# Patient Record
Sex: Male | Born: 1950 | Race: White | Hispanic: No | Smoking: Current some day smoker
Health system: Southern US, Community
[De-identification: ages and names within clinical notes are randomized; demographics above are authoritative.]

## PROBLEM LIST (undated history)

## (undated) DIAGNOSIS — C4490 Unspecified malignant neoplasm of skin, unspecified: Secondary | ICD-10-CM

## (undated) DIAGNOSIS — N529 Male erectile dysfunction, unspecified: Secondary | ICD-10-CM

## (undated) DIAGNOSIS — J439 Emphysema, unspecified: Secondary | ICD-10-CM

## (undated) DIAGNOSIS — R110 Nausea: Secondary | ICD-10-CM

## (undated) DIAGNOSIS — J449 Chronic obstructive pulmonary disease, unspecified: Secondary | ICD-10-CM

## (undated) DIAGNOSIS — N419 Inflammatory disease of prostate, unspecified: Secondary | ICD-10-CM

## (undated) DIAGNOSIS — C801 Malignant (primary) neoplasm, unspecified: Secondary | ICD-10-CM

## (undated) DIAGNOSIS — R0600 Dyspnea, unspecified: Secondary | ICD-10-CM

## (undated) DIAGNOSIS — I10 Essential (primary) hypertension: Secondary | ICD-10-CM

## (undated) DIAGNOSIS — N179 Acute kidney failure, unspecified: Secondary | ICD-10-CM

## (undated) DIAGNOSIS — Z8249 Family history of ischemic heart disease and other diseases of the circulatory system: Secondary | ICD-10-CM

## (undated) DIAGNOSIS — E876 Hypokalemia: Secondary | ICD-10-CM

## (undated) DIAGNOSIS — Z862 Personal history of diseases of the blood and blood-forming organs and certain disorders involving the immune mechanism: Secondary | ICD-10-CM

## (undated) DIAGNOSIS — R079 Chest pain, unspecified: Secondary | ICD-10-CM

## (undated) DIAGNOSIS — E785 Hyperlipidemia, unspecified: Secondary | ICD-10-CM

## (undated) HISTORY — DX: Nausea: R11.0

## (undated) HISTORY — DX: Hypokalemia: E87.6

## (undated) HISTORY — DX: Acute kidney failure, unspecified: N17.9

## (undated) HISTORY — DX: Emphysema, unspecified: J43.9

## (undated) HISTORY — DX: Dyspnea, unspecified: R06.00

## (undated) HISTORY — DX: Unspecified malignant neoplasm of skin, unspecified: C44.90

## (undated) HISTORY — DX: Family history of ischemic heart disease and other diseases of the circulatory system: Z82.49

## (undated) HISTORY — DX: Chest pain, unspecified: R07.9

## (undated) HISTORY — DX: Malignant (primary) neoplasm, unspecified: C80.1

## (undated) HISTORY — DX: Essential (primary) hypertension: I10

## (undated) HISTORY — DX: Chronic obstructive pulmonary disease, unspecified: J44.9

## (undated) HISTORY — DX: Personal history of diseases of the blood and blood-forming organs and certain disorders involving the immune mechanism: Z86.2

## (undated) HISTORY — DX: Male erectile dysfunction, unspecified: N52.9

## (undated) HISTORY — PX: TONSILLECTOMY: SUR1361

## (undated) HISTORY — DX: Hyperlipidemia, unspecified: E78.5

---

## 1999-09-23 ENCOUNTER — Encounter (INDEPENDENT_AMBULATORY_CARE_PROVIDER_SITE_OTHER): Payer: Self-pay | Admitting: Specialist

## 1999-09-23 ENCOUNTER — Ambulatory Visit (HOSPITAL_COMMUNITY): Admission: RE | Admit: 1999-09-23 | Discharge: 1999-09-23 | Payer: Self-pay | Admitting: Gastroenterology

## 2003-05-04 ENCOUNTER — Ambulatory Visit (HOSPITAL_COMMUNITY): Admission: RE | Admit: 2003-05-04 | Discharge: 2003-05-04 | Payer: Self-pay | Admitting: Gastroenterology

## 2005-09-21 ENCOUNTER — Encounter: Admission: RE | Admit: 2005-09-21 | Discharge: 2005-09-21 | Payer: Self-pay | Admitting: Cardiology

## 2008-03-27 HISTORY — PX: COLONOSCOPY: SHX174

## 2009-06-22 ENCOUNTER — Encounter: Payer: Self-pay | Admitting: Family Medicine

## 2009-07-06 ENCOUNTER — Ambulatory Visit (HOSPITAL_COMMUNITY): Admission: RE | Admit: 2009-07-06 | Discharge: 2009-07-06 | Payer: Self-pay | Admitting: Orthopedic Surgery

## 2009-11-18 ENCOUNTER — Encounter (HOSPITAL_COMMUNITY): Admission: RE | Admit: 2009-11-18 | Discharge: 2009-12-22 | Payer: Self-pay | Admitting: Cardiology

## 2010-04-28 NOTE — Letter (Signed)
Summary: [facility]  [facility]   Imported By: Maryln Gottron 06/29/2009 15:54:04  _____________________________________________________________________  External Attachment:    Type:   Image     Comment:   External Document

## 2010-07-05 ENCOUNTER — Encounter: Payer: Self-pay | Admitting: Family Medicine

## 2010-07-05 ENCOUNTER — Ambulatory Visit (INDEPENDENT_AMBULATORY_CARE_PROVIDER_SITE_OTHER): Payer: BLUE CROSS/BLUE SHIELD | Admitting: Family Medicine

## 2010-07-05 DIAGNOSIS — K219 Gastro-esophageal reflux disease without esophagitis: Secondary | ICD-10-CM

## 2010-07-05 DIAGNOSIS — IMO0001 Reserved for inherently not codable concepts without codable children: Secondary | ICD-10-CM

## 2010-07-05 DIAGNOSIS — K4090 Unilateral inguinal hernia, without obstruction or gangrene, not specified as recurrent: Secondary | ICD-10-CM

## 2010-07-05 DIAGNOSIS — E785 Hyperlipidemia, unspecified: Secondary | ICD-10-CM

## 2010-07-05 DIAGNOSIS — F17200 Nicotine dependence, unspecified, uncomplicated: Secondary | ICD-10-CM

## 2010-07-05 DIAGNOSIS — J438 Other emphysema: Secondary | ICD-10-CM

## 2010-07-05 DIAGNOSIS — N529 Male erectile dysfunction, unspecified: Secondary | ICD-10-CM

## 2010-07-05 LAB — HEPATIC FUNCTION PANEL
ALT: 14 U/L (ref 0–53)
Alkaline Phosphatase: 66 U/L (ref 39–117)
Bilirubin, Direct: 0.1 mg/dL (ref 0.0–0.3)
Total Bilirubin: 0.7 mg/dL (ref 0.3–1.2)
Total Protein: 6.6 g/dL (ref 6.0–8.3)

## 2010-07-05 LAB — LIPID PANEL
Cholesterol: 136 mg/dL (ref 0–200)
HDL: 36.2 mg/dL — ABNORMAL LOW (ref >39.00)
Total CHOL/HDL Ratio: 4
VLDL: 17.8 mg/dL (ref 0.0–40.0)

## 2010-07-05 MED ORDER — SILDENAFIL CITRATE 100 MG PO TABS: 100.0000 mg | ORAL_TABLET | ORAL | Status: DC | PRN

## 2010-07-05 NOTE — Progress Notes (Signed)
  Subjective:    Patient ID: Martin Navarro, male    DOB: 1950/11/16, 60 y.o.   MRN: [id number]  HPI Patient is  new to establish care. Past medical history reviewed. History of GERD, ongoing cigarette use, emphysema, hyperlipidemia. Recent nuclear stress test negative for ischemia. No cardiac history. He has no history of diabetes. Father had bypass at 42. No family history of premature cardiac disease.  Medications are reviewed. Compliant with all. He takes Crestor, Prilosec, aspirin, and omega-3 supplement.  Recent issue of some left inguinal pain. Went to urologist and was noted to have small inguinal hernia. No visible bulging. Pain exacerbated by lifting and sometimes writing.  Patient is divorced. Smokes one half pack cigarette per day. No alcohol use. Works full time.   Review of Systems  Constitutional: Negative for fever, chills, activity change, appetite change, fatigue and unexpected weight change.  HENT: Negative for sore throat and trouble swallowing.   Respiratory: Positive for cough. Negative for shortness of breath and wheezing.   Cardiovascular: Negative for chest pain, palpitations and leg swelling.  Gastrointestinal: Negative for vomiting, abdominal pain, diarrhea, constipation and blood in stool.  Genitourinary: Negative for dysuria.  Musculoskeletal: Negative for back pain.  Skin: Negative for rash.  Neurological: Negative for dizziness, weakness and headaches.  Hematological: Negative for adenopathy. Does not bruise/bleed easily.  Psychiatric/Behavioral: Negative for dysphoric mood.       Objective:   Physical Exam  Constitutional: He is oriented to person, place, and time. He appears well-developed and well-nourished. No distress.  HENT:  Head: Normocephalic and atraumatic.  Mouth/Throat: Oropharynx is clear and moist. No oropharyngeal exudate.  Eyes: Pupils are equal, round, and reactive to light.  Neck: Neck supple.  Cardiovascular: Normal rate, regular  rhythm and normal heart sounds.   No murmur heard. Pulmonary/Chest: Effort normal and breath sounds normal. No respiratory distress. He has no wheezes. He has no rales.  Abdominal: Bowel sounds are normal. He exhibits no distension and no mass. There is no tenderness. There is no rebound and no guarding.  Genitourinary:       Testes normal. Small left inguinal hernia. Nontender. No inguinal adenopathy  Musculoskeletal: He exhibits no edema.  Lymphadenopathy:    He has no cervical adenopathy.  Neurological: He is alert and oriented to person, place, and time.  Psychiatric: He has a normal mood and affect.          Assessment & Plan:  #1 small left inguinal hernia with associated pain in this region. 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 Obtain labs with lipid and hepatic panel. Patient requests refill of Viagra which has taken in the past for erectile dysfunction

## 2010-07-06 NOTE — Progress Notes (Signed)
Quick Note:  Pt informed ______ 

## 2010-07-11 ENCOUNTER — Encounter: Payer: Self-pay | Admitting: Family Medicine

## 2010-08-12 NOTE — Op Note (Signed)
[facility]  Patient:    ARN, MCOMBER                         MRN: [id number] Adm. Date:  [id number] Attending:  Louie Bun CC:         Osker Mason, M.D.                           Operative Report  PROCEDURE:  Colonoscopy with polypectomy.  INDICATIONS FOR PROCEDURE:  Heme positive stools.  PROCEDURE:  The patient was placed in the left lateral decubitus position and placed on the pulse monitor with continuous low-flow oxygen delivered via nasal cannula.  He was sedated with 70 mg IV Demerol and 8 mg IV Versed.  The Olympus video colonoscope was inserted into the rectum and advanced to the cecum, confirmed by transillumination of McBurneys point and visualization of the ileocecal valve and appendiceal orifice.  The prep was excellent.  The cecum and ascending colon appeared normal with no masses, polyps, diverticula or other mucosal abnormalities.  In the transverse colon was seen a single, 1-cm sessile polyp which was fulgurated by snare.  The remainder of the transverse, descending, sigmoid and rectum appeared normal with no further polyps, masses, diverticula or other mucosal abnormalities.  The rectum, likewise, appeared normal on retroflexed view.  The anus revealed no obvious internal hemorrhoids.  The colonoscope was then withdrawn and the patient returned to the recovery room in stable condition.  He tolerated the procedure well and there were no immediate complications.  IMPRESSION:  Transverse colon polyps, otherwise normal colonoscopy.  PLAN:  Await histology for determination of need for further surveillance colonoscopies. DD:  09/23/99 TD:  09/24/99 Job: [id number]

## 2010-08-12 NOTE — Op Note (Signed)
Martin Navarro, Martin Navarro                            ACCOUNT NO.:  [id number]   MEDICAL RECORD NO.:  [id number]                   PATIENT TYPE:  AMB   LOCATION:  ENDO                                 FACILITY:  [facility]   PHYSICIAN:  John C. Madilyn Fireman, M.D.                 DATE OF BIRTH:  February 01, 1951   DATE OF PROCEDURE:  05/04/2003  DATE OF DISCHARGE:                                 OPERATIVE REPORT   PROCEDURE:  Colonoscopy.   INDICATIONS FOR PROCEDURE:  History of adenomatous colon polyps on initial  colonoscopy four years ago.   DESCRIPTION OF PROCEDURE:  The patient was placed in the left lateral  decubitus position and placed on the pulse monitor with continuous low-flow  oxygen delivered by nasal cannula.  He was sedated with 75 mcg IV fentanyl  and 8 mg IV Versed.  The Olympus video colonoscope was inserted into the  rectum and advanced to the cecum, confirmed by transillumination of  McBurney's point and visualization of the ileocecal valve and appendiceal  orifice.  Prep was excellent.  The cecum, ascending, transverse, and  descending colon all  appeared normal with no masses, polyps, diverticula,  or other mucosal abnormalities.  Within the sigmoid colon were seen a few  scattered diverticula with no other abnormalities.  The rectum appeared  normal and retroflexed view of the anus revealed no obvious internal  hemorrhoids.  The scope was then withdrawn and the patient returned to the  recovery room in stable condition.  He tolerated the procedure well and  there were no immediate complications.   IMPRESSION:  A few scattered diverticula; otherwise normal study.   PLAN:  Repeat colonoscopy in five years.                                               John C. Madilyn Fireman, M.D.    [facility]  D:  05/04/2003  T:  05/04/2003  Job:  [id number]   cc:   Ernestina Penna, M.D.  [address] [place]  [place] [address]  Fax: [phone]

## 2010-10-25 ENCOUNTER — Ambulatory Visit (INDEPENDENT_AMBULATORY_CARE_PROVIDER_SITE_OTHER): Payer: BLUE CROSS/BLUE SHIELD | Admitting: Family Medicine

## 2010-10-25 ENCOUNTER — Encounter: Payer: Self-pay | Admitting: Family Medicine

## 2010-10-25 VITALS — BP 120/82 | HR 72 | Temp 98.1°F | Resp 12 | Ht 65.0 in | Wt 181.0 lb

## 2010-10-25 DIAGNOSIS — K219 Gastro-esophageal reflux disease without esophagitis: Secondary | ICD-10-CM

## 2010-10-25 DIAGNOSIS — E785 Hyperlipidemia, unspecified: Secondary | ICD-10-CM

## 2010-10-25 DIAGNOSIS — N529 Male erectile dysfunction, unspecified: Secondary | ICD-10-CM

## 2010-10-25 DIAGNOSIS — Z720 Tobacco use: Secondary | ICD-10-CM

## 2010-10-25 DIAGNOSIS — Z23 Encounter for immunization: Secondary | ICD-10-CM

## 2010-10-25 DIAGNOSIS — Z0000 Encounter for general adult medical examination without abnormal findings: Secondary | ICD-10-CM

## 2010-10-25 HISTORY — DX: Tobacco use: Z72.0

## 2010-10-25 HISTORY — DX: Gastro-esophageal reflux disease without esophagitis: K21.9

## 2010-10-25 LAB — BASIC METABOLIC PANEL
BUN: 14 mg/dL (ref 6–23)
CO2: 27 mEq/L (ref 19–32)
Calcium: 8.9 mg/dL (ref 8.4–10.5)
Chloride: 108 mEq/L (ref 96–112)
GFR: 102.84 mL/min (ref >60.00)
Potassium: 4.7 mEq/L (ref 3.5–5.1)

## 2010-10-25 LAB — CBC WITH DIFFERENTIAL/PLATELET
Basophils Absolute: 0.1 10*3/uL (ref 0.0–0.1)
MCHC: 32.6 g/dL (ref 30.0–36.0)
MCV: 94.7 fl (ref 78.0–100.0)
Neutro Abs: 11.1 10*3/uL — ABNORMAL HIGH (ref 1.4–7.7)

## 2010-10-25 MED ORDER — SILDENAFIL CITRATE 100 MG PO TABS: 100.0000 mg | ORAL_TABLET | ORAL | Status: DC | PRN

## 2010-10-25 MED ORDER — TETANUS-DIPHTH-ACELL PERTUSSIS 5-2.5-18.5 LF-MCG/0.5 IM SUSP: 0.5000 mL | Freq: Once | INTRAMUSCULAR | Status: DC

## 2010-10-25 MED ORDER — ROSUVASTATIN CALCIUM 10 MG PO TABS: 10.0000 mg | ORAL_TABLET | Freq: Every day | ORAL | Status: DC

## 2010-10-25 MED ORDER — OMEPRAZOLE 40 MG PO CPDR: 40.0000 mg | DELAYED_RELEASE_CAPSULE | Freq: Every day | ORAL | Status: DC

## 2010-10-25 NOTE — Progress Notes (Signed)
Subjective:    Patient ID: Martin Navarro, male    DOB: [birth year], 60 y.o.   MRN: [id number]  HPI Patient seen for complete physical examination. Past medical history, family history, and social history reviewed. He has history of GERD, hyperlipidemia, erectile dysfunction, and low testosterone. Followed by urologist who is monitoring his PSA. Recently several months ago placed on testosterone replacement but he still has problems with chronic low libido. Erectile dysfunction and he has not had great response to medications. Still takes Viagra as needed.  Still smoking.  Recent lipids were checked and at goal with exception of low HDL. Takes Crestor 10 mg daily. No myalgias. No hx of CAD.  History of colonoscopy 2010. Last tetanus unknown. No history of shingles vaccine. Long history of nicotine use.  Past Medical History  Diagnosis Date  . Asthma   . Emphysema of lung   . Hyperlipidemia    No past surgical history on file.  reports that he has been smoking Cigarettes.  He has a 67.5 pack-year smoking history. He does not have any smokeless tobacco history on file. His alcohol and drug histories not on file. family history includes Heart disease (age of onset:65) in his father and Hypertension in his father. No Known Allergies    Review of Systems  Constitutional: Negative for fever, activity change, appetite change, fatigue and unexpected weight change.  HENT: Negative for ear pain, congestion and trouble swallowing.   Eyes: Negative for pain and visual disturbance.  Respiratory: Negative for cough, shortness of breath and wheezing.   Cardiovascular: Negative for chest pain and palpitations.  Gastrointestinal: Negative for nausea, vomiting, abdominal pain, diarrhea, constipation, blood in stool, abdominal distention and rectal pain.  Genitourinary: Negative for dysuria, hematuria and testicular pain.  Musculoskeletal: Negative for joint swelling and arthralgias.  Skin: Negative  for rash.  Neurological: Negative for dizziness, syncope and headaches.  Hematological: Negative for adenopathy.  Psychiatric/Behavioral: Negative for confusion, dysphoric mood and agitation.       Objective:   Physical Exam  Constitutional: He is oriented to person, place, and time. He appears well-developed and well-nourished. No distress.  HENT:  Head: Normocephalic and atraumatic.  Right Ear: External ear normal.  Left Ear: External ear normal.  Mouth/Throat: Oropharynx is clear and moist.  Eyes: Conjunctivae and EOM are normal. Pupils are equal, round, and reactive to light.  Neck: Normal range of motion. Neck supple. No thyromegaly present.  Cardiovascular: Normal rate, regular rhythm and normal heart sounds.   No murmur heard. Pulmonary/Chest: No respiratory distress. He has no wheezes. He has no rales.  Abdominal: Soft. Bowel sounds are normal. He exhibits no distension and no mass. There is no tenderness. There is no rebound and no guarding.  Genitourinary:       Per urology  Musculoskeletal: He exhibits no edema.  Lymphadenopathy:    He has no cervical adenopathy.  Neurological: He is alert and oriented to person, place, and time. He displays normal reflexes. No cranial nerve deficit.  Skin: No rash noted.  Psychiatric: He has a normal mood and affect. His behavior is normal.          Assessment & Plan:  #1 health maintenance. Colonoscopy up-to-date. Tetanus booster given. Yearly flu vaccine recommended. Patient thinks he's had previous Pneumovax. Discussed nicotine cessation but motivation low. Check on coverage for shingles vaccine.  #2 history of GERD controlled with omeprazole 40 mg. Refills given for one year #3 hyperlipidemia. Recent lipids at goal. Refill medication  for one year #4 erectile dysfunction. Refill Viagra. He is to follow up with urologist if not seeing good response with this No problem-specific assessment & plan notes found for this  encounter.

## 2010-10-25 NOTE — Patient Instructions (Signed)
Check on coverage for shingles vaccine and we will be happy to schedule.

## 2010-10-26 ENCOUNTER — Other Ambulatory Visit: Payer: Self-pay | Admitting: Family Medicine

## 2010-10-26 DIAGNOSIS — E785 Hyperlipidemia, unspecified: Secondary | ICD-10-CM

## 2010-10-26 NOTE — Progress Notes (Signed)
Quick Note:  Will mail labs and requested repeat CBC to pt home highlighted ______

## 2010-10-26 NOTE — Progress Notes (Signed)
Quick Note:  Will also order future CBC ______

## 2010-11-01 ENCOUNTER — Telehealth: Payer: Self-pay | Admitting: Family Medicine

## 2010-11-01 DIAGNOSIS — E785 Hyperlipidemia, unspecified: Secondary | ICD-10-CM

## 2010-11-01 NOTE — Telephone Encounter (Signed)
Patient received a letter for Martin Navarro in the mail about his labs. She would like to speak with Martin Navarro regarding this matter. Refused to speak with triage nurse. Thanks.

## 2010-11-01 NOTE — Telephone Encounter (Signed)
Daughter Karoline Caldwell reports his fathers WBC has been high in the past, questioing if we had received medical records from [place].  Also, daughter requesting a lipid panel be done with the repeat CBC lab appt.  Pt did check with his insurance co and they will pay for it, reqiuesting shingles vaccine when he comes for labs OK needed for lipid panel request, pt will need to fast

## 2010-11-01 NOTE — Telephone Encounter (Signed)
OK to add lipid panel to CBC.

## 2010-11-02 NOTE — Telephone Encounter (Signed)
Lipid panel added for future order

## 2010-11-11 ENCOUNTER — Other Ambulatory Visit: Payer: BLUE CROSS/BLUE SHIELD

## 2010-11-25 ENCOUNTER — Other Ambulatory Visit: Payer: BLUE CROSS/BLUE SHIELD

## 2010-11-30 ENCOUNTER — Other Ambulatory Visit (INDEPENDENT_AMBULATORY_CARE_PROVIDER_SITE_OTHER): Payer: BLUE CROSS/BLUE SHIELD

## 2010-11-30 ENCOUNTER — Other Ambulatory Visit (INDEPENDENT_AMBULATORY_CARE_PROVIDER_SITE_OTHER): Payer: BLUE CROSS/BLUE SHIELD | Admitting: Family Medicine

## 2010-11-30 DIAGNOSIS — E785 Hyperlipidemia, unspecified: Secondary | ICD-10-CM

## 2010-11-30 DIAGNOSIS — Z23 Encounter for immunization: Secondary | ICD-10-CM

## 2010-11-30 LAB — LIPID PANEL
HDL: 36 mg/dL — ABNORMAL LOW (ref >39.00)
LDL Cholesterol: 77 mg/dL (ref 0–99)
Total CHOL/HDL Ratio: 4
VLDL: 19.4 mg/dL (ref 0.0–40.0)

## 2010-12-01 LAB — CBC
HCT: 45.5 % (ref 39.0–52.0)
Hemoglobin: 14.9 g/dL (ref 13.0–17.0)
MCV: 93.6 fL (ref 78.0–100.0)
Platelets: 225 10*3/uL (ref 150–400)
RBC: 4.86 MIL/uL (ref 4.22–5.81)
WBC: 10.4 10*3/uL (ref 4.0–10.5)

## 2010-12-02 NOTE — Progress Notes (Signed)
Quick Note:  Spoke with pt - informed ______ 

## 2010-12-02 NOTE — Progress Notes (Signed)
Quick Note:  Spoke with pt - informed of lab results ______ 

## 2011-06-28 ENCOUNTER — Ambulatory Visit (INDEPENDENT_AMBULATORY_CARE_PROVIDER_SITE_OTHER): Payer: BLUE CROSS/BLUE SHIELD | Admitting: Family Medicine

## 2011-06-28 ENCOUNTER — Encounter: Payer: Self-pay | Admitting: Family Medicine

## 2011-06-28 VITALS — BP 122/82 | Temp 98.4°F | Wt 195.0 lb

## 2011-06-28 DIAGNOSIS — R232 Flushing: Secondary | ICD-10-CM

## 2011-06-28 MED ORDER — SILDENAFIL CITRATE 100 MG PO TABS: 50.0000 mg | ORAL_TABLET | Freq: Every day | ORAL | Status: DC | PRN

## 2011-06-28 NOTE — Patient Instructions (Signed)
Hold Cialis for one week to see if flushing resolves and be in touch if no improvement.

## 2011-06-28 NOTE — Progress Notes (Signed)
  Subjective:    Patient ID: Martin Navarro, male    DOB: [birth year], 61 y.o.   MRN: [id number]  HPI  Patient seen with chief complaint of facial flushing. Had seen urologist and placed on Cialis 5 mg daily. He thinks symptoms correlate with that. No history of rosacea. Denies any papules or pustules. No history of niacin use. His other medications include Crestor for hyperlipidemia. Takes Prilosec for GERD symptoms. Those are well controlled. Denies any recent chest pain. No facial rash. No other exacerbating factors such as foods or beverages.  Past Medical History  Diagnosis Date  . Asthma   . Emphysema of lung   . Hyperlipidemia   . Erectile dysfunction    No past surgical history on file.  reports that he has been smoking Cigarettes.  He has a 67.5 pack-year smoking history. He does not have any smokeless tobacco history on file. His alcohol and drug histories not on file. family history includes Heart disease (age of onset:65) in his father and Hypertension in his father. No Known Allergies    Review of Systems  Constitutional: Negative for fever, appetite change, fatigue and unexpected weight change.  Respiratory: Negative for cough and shortness of breath.   Cardiovascular: Negative for chest pain, palpitations and leg swelling.  Gastrointestinal: Negative for abdominal pain.       Objective:   Physical Exam  Constitutional: He appears well-developed and well-nourished.  Neck: Neck supple. No thyromegaly present.  Cardiovascular: Normal rate and regular rhythm.   Pulmonary/Chest: Effort normal and breath sounds normal. No respiratory distress. He has no wheezes. He has no rales.  Musculoskeletal: He exhibits no edema.  Skin: No rash noted.          Assessment & Plan:  Facial flushing. Suspect related to Cialis. He will hold Cialis for the next one to 2 weeks and see if symptoms resolve. If not resolving off that medication be in touch for further evaluation.

## 2011-10-17 ENCOUNTER — Telehealth: Payer: Self-pay | Admitting: Family Medicine

## 2011-10-17 NOTE — Telephone Encounter (Signed)
Caller: [name]/Child; PCP: Evelena Peat; CB#: [phone]; Call regarding Chest Pain/Chest Discomfort;  Onset:10/16/11 X 1-2 hrs. Calling to ask if OK to wait until appt 10/26/11. Male, > age 61, smoker, overweight, hypercholesterol.   Martin Navarro currently at audiology appt. Reviewed s/s MI. Advised to call back for triage when Martin Navarro is present or have Martin Navarro call r/t caller not with pt and unable to provide clinical info to facilitate triage per Info Only Call Guideline. Instructed if chest pain recurs for 5 min or longer, to go immediately to ED.

## 2011-10-17 NOTE — Telephone Encounter (Signed)
Spoke with [name] and she states that he is feeling better and believes that it is the heat.  She does verbally agree that if any changes he should go to the ER.

## 2011-10-17 NOTE — Telephone Encounter (Signed)
Would not wait until August to be evaluated.  As per Call note, should go immediately to ED for any recurrent chest pain.  Otherwise, let's see if we can get him in sooner.

## 2011-10-19 ENCOUNTER — Other Ambulatory Visit (INDEPENDENT_AMBULATORY_CARE_PROVIDER_SITE_OTHER): Payer: BLUE CROSS/BLUE SHIELD

## 2011-10-19 DIAGNOSIS — Z0000 Encounter for general adult medical examination without abnormal findings: Secondary | ICD-10-CM

## 2011-10-19 LAB — LIPID PANEL
Cholesterol: 132 mg/dL (ref 0–200)
HDL: 37.2 mg/dL — ABNORMAL LOW (ref >39.00)
LDL Cholesterol: 77 mg/dL (ref 0–99)
Triglycerides: 91 mg/dL (ref 0.0–149.0)
VLDL: 18.2 mg/dL (ref 0.0–40.0)

## 2011-10-19 LAB — POCT URINALYSIS DIPSTICK
Leukocytes, UA: NEGATIVE
Nitrite, UA: NEGATIVE
Spec Grav, UA: 1.025
Urobilinogen, UA: 0.2

## 2011-10-19 LAB — BASIC METABOLIC PANEL
BUN: 14 mg/dL (ref 6–23)
CO2: 27 mEq/L (ref 19–32)
Calcium: 9.1 mg/dL (ref 8.4–10.5)
Chloride: 106 mEq/L (ref 96–112)
Glucose, Bld: 96 mg/dL (ref 70–99)
Potassium: 3.9 mEq/L (ref 3.5–5.1)

## 2011-10-19 LAB — CBC WITH DIFFERENTIAL/PLATELET
Basophils Absolute: 0.1 10*3/uL (ref 0.0–0.1)
Basophils Relative: 0.5 % (ref 0.0–3.0)
Eosinophils Absolute: 0.3 10*3/uL (ref 0.0–0.7)
Hemoglobin: 15.5 g/dL (ref 13.0–17.0)
Lymphocytes Relative: 27.4 % (ref 12.0–46.0)
MCHC: 33.2 g/dL (ref 30.0–36.0)
Monocytes Absolute: 1 10*3/uL (ref 0.1–1.0)
Neutrophils Relative %: 62.2 % (ref 43.0–77.0)
Platelets: 173 10*3/uL (ref 150.0–400.0)
RDW: 15 % — ABNORMAL HIGH (ref 11.5–14.6)

## 2011-10-19 LAB — HEMOGLOBIN A1C: Hgb A1c MFr Bld: 6.6 % — ABNORMAL HIGH (ref 4.6–6.5)

## 2011-10-19 LAB — PSA: PSA: 0.66 ng/mL (ref 0.10–4.00)

## 2011-10-19 LAB — TSH: TSH: 1.59 u[IU]/mL (ref 0.35–5.50)

## 2011-10-19 LAB — HEPATIC FUNCTION PANEL
AST: 16 U/L (ref 0–37)
Alkaline Phosphatase: 67 U/L (ref 39–117)

## 2011-10-23 ENCOUNTER — Other Ambulatory Visit: Payer: Self-pay | Admitting: Family Medicine

## 2011-10-26 ENCOUNTER — Encounter: Payer: Self-pay | Admitting: Family Medicine

## 2011-10-26 ENCOUNTER — Ambulatory Visit (INDEPENDENT_AMBULATORY_CARE_PROVIDER_SITE_OTHER): Payer: BLUE CROSS/BLUE SHIELD | Admitting: Family Medicine

## 2011-10-26 VITALS — BP 132/80 | HR 72 | Temp 98.2°F | Resp 12 | Ht 65.0 in | Wt 179.0 lb

## 2011-10-26 DIAGNOSIS — E291 Testicular hypofunction: Secondary | ICD-10-CM

## 2011-10-26 DIAGNOSIS — Z0000 Encounter for general adult medical examination without abnormal findings: Secondary | ICD-10-CM

## 2011-10-26 HISTORY — DX: Testicular hypofunction: E29.1

## 2011-10-26 NOTE — Progress Notes (Signed)
  Subjective:    Patient ID: Martin Navarro, male    DOB: [birth year], 61 y.o.   MRN: [id number]  HPI  Patient for complete physical. Ongoing nicotine use. He is trying to quit. He has long-standing history of erectile dysfunction and hypogonadism. Has been followed by urology. After replacement with testosterone he did not see improvement in erectile dysfunction or low libido and has stopped injections at this time. Hyperlipidemia treated Crestor. Immunizations up-to-date. Colonoscopy up to date.  Past Medical History  Diagnosis Date  . Asthma   . Emphysema of lung   . Hyperlipidemia   . Erectile dysfunction    No past surgical history on file.  reports that he has been smoking Cigarettes.  He has a 67.5 pack-year smoking history. He does not have any smokeless tobacco history on file. His alcohol and drug histories not on file. family history includes Heart disease (age of onset:65) in his father and Hypertension in his father. No Known Allergies   Review of Systems  Constitutional: Negative for fever, activity change, appetite change, fatigue and unexpected weight change.  HENT: Negative for ear pain, congestion and trouble swallowing.   Eyes: Negative for pain and visual disturbance.  Respiratory: Negative for cough, shortness of breath and wheezing.   Cardiovascular: Negative for chest pain and palpitations.  Gastrointestinal: Negative for nausea, vomiting, abdominal pain, diarrhea, constipation, blood in stool, abdominal distention and rectal pain.  Genitourinary: Negative for dysuria, hematuria and testicular pain.  Musculoskeletal: Negative for joint swelling and arthralgias.  Skin: Negative for rash.  Neurological: Negative for dizziness, syncope and headaches.  Hematological: Negative for adenopathy.  Psychiatric/Behavioral: Negative for confusion and dysphoric mood.       Objective:   Physical Exam  Constitutional: He is oriented to person, place, and time. He appears  well-developed and well-nourished. No distress.  HENT:  Head: Normocephalic and atraumatic.  Right Ear: External ear normal.  Left Ear: External ear normal.  Mouth/Throat: Oropharynx is clear and moist.  Eyes: Conjunctivae and EOM are normal. Pupils are equal, round, and reactive to light.  Neck: Normal range of motion. Neck supple. No thyromegaly present.  Cardiovascular: Normal rate, regular rhythm and normal heart sounds.   No murmur heard. Pulmonary/Chest: No respiratory distress. He has no wheezes. He has no rales.  Abdominal: Soft. Bowel sounds are normal. He exhibits no distension and no mass. There is no tenderness. There is no rebound and no guarding.  Musculoskeletal: He exhibits no edema.  Lymphadenopathy:    He has no cervical adenopathy.  Neurological: He is alert and oriented to person, place, and time. He displays normal reflexes. No cranial nerve deficit.  Skin: No rash noted.  Psychiatric: He has a normal mood and affect.          Assessment & Plan:  Complete physical. Smoking cessation discussed. Information given. Patient is considering electronic cigarettes. Labs reviewed with patient. All basically favorable with the exception of low HDL.

## 2011-10-26 NOTE — Patient Instructions (Addendum)
Smoking Cessation This document explains the best ways for you to quit smoking and new treatments to help. It lists new medicines that can double or triple your chances of quitting and quitting for good. It also considers ways to avoid relapses and concerns you may have about quitting, including weight gain. NICOTINE: A POWERFUL ADDICTION If you have tried to quit smoking, you know how hard it can be. It is hard because nicotine is a very addictive drug. For some people, it can be as addictive as heroin or cocaine. Usually, people make 2 or 3 tries, or more, before finally being able to quit. Each time you try to quit, you can learn about what helps and what hurts. Quitting takes hard work and a lot of effort, but you can quit smoking. QUITTING SMOKING IS ONE OF THE MOST IMPORTANT THINGS YOU WILL EVER DO.  You will live longer, feel better, and live better.   The impact on your body of quitting smoking is felt almost immediately:   Within 20 minutes, blood pressure decreases. Pulse returns to its normal level.   After 8 hours, carbon monoxide levels in the blood return to normal. Oxygen level increases.   After 24 hours, chance of heart attack starts to decrease. Breath, hair, and body stop smelling like smoke.   After 48 hours, damaged nerve endings begin to recover. Sense of taste and smell improve.   After 72 hours, the body is virtually free of nicotine. Bronchial tubes relax and breathing becomes easier.   After 2 to 12 weeks, lungs can hold more air. Exercise becomes easier and circulation improves.   Quitting will reduce your risk of having a heart attack, stroke, cancer, or lung disease:   After 1 year, the risk of coronary heart disease is cut in half.   After 5 years, the risk of stroke falls to the same as a nonsmoker.   After 10 years, the risk of lung cancer is cut in half and the risk of other cancers decreases significantly.   After 15 years, the risk of coronary heart  disease drops, usually to the level of a nonsmoker.   If you are pregnant, quitting smoking will improve your chances of having a healthy baby.   The people you live with, especially your children, will be healthier.   You will have extra money to spend on things other than cigarettes.  FIVE KEYS TO QUITTING Studies have shown that these 5 steps will help you quit smoking and quit for good. You have the best chances of quitting if you use them together: 1. Get ready.  2. Get support and encouragement.  3. Learn new skills and behaviors.  4. Get medicine to reduce your nicotine addiction and use it correctly.  5. Be prepared for relapse or difficult situations. Be determined to continue trying to quit, even if you do not succeed at first.  1. GET READY  Set a quit date.   Change your environment.   Get rid of ALL cigarettes, ashtrays, matches, and lighters in your home, car, and place of work.   Do not let people smoke in your home.   Review your past attempts to quit. Think about what worked and what did not.   Once you quit, do not smoke. NOT EVEN A PUFF!  2. GET SUPPORT AND ENCOURAGEMENT Studies have shown that you have a better chance of being successful if you have help. You can get support in many ways.  Tell   your family, friends, and coworkers that you are going to quit and need their support. Ask them not to smoke around you.   Talk to your caregivers (doctor, dentist, nurse, pharmacist, psychologist, and/or smoking counselor).   Get individual, group, or telephone counseling and support. The more counseling you have, the better your chances are of quitting. Programs are available at local hospitals and health centers. Call your local health department for information about programs in your area.   Spiritual beliefs and practices may help some smokers quit.   Quit meters are small computer programs online or downloadable that keep track of quit statistics, such as amount  of "quit-time," cigarettes not smoked, and money saved.   Many smokers find one or more of the many self-help books available useful in helping them quit and stay off tobacco.  3. LEARN NEW SKILLS AND BEHAVIORS  Try to distract yourself from urges to smoke. Talk to someone, go for a walk, or occupy your time with a task.   When you first try to quit, change your routine. Take a different route to work. Drink tea instead of coffee. Eat breakfast in a different place.   Do something to reduce your stress. Take a hot bath, exercise, or read a book.   Plan something enjoyable to do every day. Reward yourself for not smoking.   Explore interactive web-based programs that specialize in helping you quit.  4. GET MEDICINE AND USE IT CORRECTLY Medicines can help you stop smoking and decrease the urge to smoke. Combining medicine with the above behavioral methods and support can quadruple your chances of successfully quitting smoking. The [place] Food and Drug Administration (FDA) has approved 7 medicines to help you quit smoking. These medicines fall into 3 categories.  Nicotine replacement therapy (delivers nicotine to your body without the negative effects and risks of smoking):   Nicotine gum: Available over-the-counter.   Nicotine lozenges: Available over-the-counter.   Nicotine inhaler: Available by prescription.   Nicotine nasal spray: Available by prescription.   Nicotine skin patches (transdermal): Available by prescription and over-the-counter.   Antidepressant medicine (helps people abstain from smoking, but how this works is unknown):   Bupropion sustained-release (SR) tablets: Available by prescription.   Nicotinic receptor partial agonist (simulates the effect of nicotine in your brain):   Varenicline tartrate tablets: Available by prescription.   Ask your caregiver for advice about which medicines to use and how to use them. Carefully read the information on the package.    Everyone who is trying to quit may benefit from using a medicine. If you are pregnant or trying to become pregnant, nursing an infant, you are under age 18, or you smoke fewer than 10 cigarettes per day, talk to your caregiver before taking any nicotine replacement medicines.   You should stop using a nicotine replacement product and call your caregiver if you experience nausea, dizziness, weakness, vomiting, fast or irregular heartbeat, mouth problems with the lozenge or gum, or redness or swelling of the skin around the patch that does not go away.   Do not use any other product containing nicotine while using a nicotine replacement product.   Talk to your caregiver before using these products if you have diabetes, heart disease, asthma, stomach ulcers, you had a recent heart attack, you have high blood pressure that is not controlled with medicine, a history of irregular heartbeat, or you have been prescribed medicine to help you quit smoking.  5. BE PREPARED FOR RELAPSE OR   DIFFICULT SITUATIONS  Most relapses occur within the first 3 months after quitting. Do not be discouraged if you start smoking again. Remember, most people try several times before they finally quit.   You may have symptoms of withdrawal because your body is used to nicotine. You may crave cigarettes, be irritable, feel very hungry, cough often, get headaches, or have difficulty concentrating.   The withdrawal symptoms are only temporary. They are strongest when you first quit, but they will go away within 10 to 14 days.  Here are some difficult situations to watch for:  Alcohol. Avoid drinking alcohol. Drinking lowers your chances of successfully quitting.   Caffeine. Try to reduce the amount of caffeine you consume. It also lowers your chances of successfully quitting.   Other smokers. Being around smoking can make you want to smoke. Avoid smokers.   Weight gain. Many smokers will gain weight when they quit, usually  less than 10 pounds. Eat a healthy diet and stay active. Do not let weight gain distract you from your main goal, quitting smoking. Some medicines that help you quit smoking may also help delay weight gain. You can always lose the weight gained after you quit.   Bad mood or depression. There are a lot of ways to improve your mood other than smoking.  If you are having problems with any of these situations, talk to your caregiver. SPECIAL SITUATIONS AND CONDITIONS Studies suggest that everyone can quit smoking. Your situation or condition can give you a special reason to quit.  Pregnant women/new mothers: By quitting, you protect your baby's health and your own.   Hospitalized patients: By quitting, you reduce health problems and help healing.   Heart attack patients: By quitting, you reduce your risk of a second heart attack.   Lung, head, and neck cancer patients: By quitting, you reduce your chance of a second cancer.   Parents of children and adolescents: By quitting, you protect your children from illnesses caused by secondhand smoke.  QUESTIONS TO THINK ABOUT Think about the following questions before you try to stop smoking. You may want to talk about your answers with your caregiver.  Why do you want to quit?   If you tried to quit in the past, what helped and what did not?   What will be the most difficult situations for you after you quit? How will you plan to handle them?   Who can help you through the tough times? Your family? Friends? Caregiver?   What pleasures do you get from smoking? What ways can you still get pleasure if you quit?  Here are some questions to ask your caregiver:  How can you help me to be successful at quitting?   What medicine do you think would be best for me and how should I take it?   What should I do if I need more help?   What is smoking withdrawal like? How can I get information on withdrawal?  Quitting takes hard work and a lot of effort,  but you can quit smoking. FOR MORE INFORMATION  Smokefree.gov (http://www.smokefree.gov) provides free, accurate, evidence-based information and professional assistance to help support the immediate and long-term needs of people trying to quit smoking. Document Released: 03/07/2001 Document Revised: 03/02/2011 Document Reviewed: 12/28/2008 [employer] Patient Information 2012 [employer]. 

## 2011-11-28 ENCOUNTER — Other Ambulatory Visit: Payer: Self-pay | Admitting: Family Medicine

## 2012-01-29 ENCOUNTER — Other Ambulatory Visit: Payer: Self-pay | Admitting: Family Medicine

## 2012-06-07 ENCOUNTER — Emergency Department (HOSPITAL_COMMUNITY): Payer: BC Managed Care – PPO

## 2012-06-07 ENCOUNTER — Emergency Department (HOSPITAL_COMMUNITY)
Admission: EM | Admit: 2012-06-07 | Discharge: 2012-06-07 | Disposition: A | Discharge status: Home / Self Care | Payer: BC Managed Care – PPO | Attending: Emergency Medicine | Admitting: Emergency Medicine

## 2012-06-07 ENCOUNTER — Encounter (HOSPITAL_COMMUNITY): Payer: Self-pay | Admitting: Emergency Medicine

## 2012-06-07 ENCOUNTER — Telehealth (INDEPENDENT_AMBULATORY_CARE_PROVIDER_SITE_OTHER): Payer: Self-pay | Admitting: *Deleted

## 2012-06-07 DIAGNOSIS — N529 Male erectile dysfunction, unspecified: Secondary | ICD-10-CM | POA: Insufficient documentation

## 2012-06-07 DIAGNOSIS — M545 Low back pain, unspecified: Secondary | ICD-10-CM | POA: Insufficient documentation

## 2012-06-07 DIAGNOSIS — Z8709 Personal history of other diseases of the respiratory system: Secondary | ICD-10-CM | POA: Insufficient documentation

## 2012-06-07 DIAGNOSIS — F17200 Nicotine dependence, unspecified, uncomplicated: Secondary | ICD-10-CM | POA: Insufficient documentation

## 2012-06-07 DIAGNOSIS — J45909 Unspecified asthma, uncomplicated: Secondary | ICD-10-CM | POA: Insufficient documentation

## 2012-06-07 DIAGNOSIS — E785 Hyperlipidemia, unspecified: Secondary | ICD-10-CM | POA: Insufficient documentation

## 2012-06-07 DIAGNOSIS — Z79899 Other long term (current) drug therapy: Secondary | ICD-10-CM | POA: Insufficient documentation

## 2012-06-07 DIAGNOSIS — Z7982 Long term (current) use of aspirin: Secondary | ICD-10-CM | POA: Insufficient documentation

## 2012-06-07 DIAGNOSIS — R109 Unspecified abdominal pain: Secondary | ICD-10-CM | POA: Insufficient documentation

## 2012-06-07 LAB — CBC WITH DIFFERENTIAL/PLATELET
Basophils Absolute: 0.1 10*3/uL (ref 0.0–0.1)
Basophils Relative: 0 % (ref 0–1)
Eosinophils Absolute: 0.2 10*3/uL (ref 0.0–0.7)
MCH: 31.1 pg (ref 26.0–34.0)
MCHC: 34 g/dL (ref 30.0–36.0)
Neutro Abs: 6.8 10*3/uL (ref 1.7–7.7)
Neutrophils Relative %: 59 % (ref 43–77)
Platelets: 215 10*3/uL (ref 150–400)
RDW: 12.9 % (ref 11.5–15.5)

## 2012-06-07 LAB — COMPREHENSIVE METABOLIC PANEL
AST: 18 U/L (ref 0–37)
Albumin: 3.6 g/dL (ref 3.5–5.2)
Alkaline Phosphatase: 68 U/L (ref 39–117)
BUN: 15 mg/dL (ref 6–23)
Potassium: 4.2 mEq/L (ref 3.5–5.1)
Sodium: 135 mEq/L (ref 135–145)
Total Protein: 6.8 g/dL (ref 6.0–8.3)

## 2012-06-07 LAB — URINE MICROSCOPIC-ADD ON

## 2012-06-07 LAB — URINALYSIS, ROUTINE W REFLEX MICROSCOPIC
Hgb urine dipstick: NEGATIVE
Protein, ur: NEGATIVE mg/dL
Urobilinogen, UA: 0.2 mg/dL (ref 0.0–1.0)

## 2012-06-07 MED ORDER — NAPROXEN 500 MG PO TABS: 500.0000 mg | ORAL_TABLET | Freq: Two times a day (BID) | ORAL | Status: DC

## 2012-06-07 MED ORDER — KETOROLAC TROMETHAMINE 30 MG/ML IJ SOLN
30.0000 mg | Freq: Once | INTRAMUSCULAR | Status: AC
  Administered 2012-06-07: 30 mg via INTRAVENOUS
  Filled 2012-06-07: qty 1

## 2012-06-07 NOTE — ED Provider Notes (Signed)
History     CSN: [id number]  Arrival date & time 06/07/12  1325   First MD Initiated Contact with Patient 06/07/12 1453      Chief Complaint  Patient presents with  . Abdominal Pain  . Back Pain    (Consider location/radiation/quality/duration/timing/severity/associated sxs/prior treatment) HPI Comments: Pt presents with lower abdominal discomfort and lower back discomfort.  This was present on awakening yesterday and has been intermittent for 2 days.  He denies increased exertion but states it feels like he did too many sit ups.  The pt has no abdominal surgical history, has hx of smoking, pain is intermittent, worse with movement such as standing up straight and with raising legs when laying down.  When he holds still he has no pain.  He has no f/c/n/v/d.  He has not had any meds for this pain either.  Patient is a 62 y.o. male presenting with abdominal pain and back pain. The history is provided by the patient.  Abdominal Pain Back Pain Associated symptoms: abdominal pain     Past Medical History  Diagnosis Date  . Asthma   . Emphysema of lung   . Hyperlipidemia   . Erectile dysfunction     History reviewed. No pertinent past surgical history.  Family History  Problem Relation Age of Onset  . Hypertension Father   . Heart disease Father 17    History  Substance Use Topics  . Smoking status: Current Every Day Smoker -- 1.50 packs/day for 45 years    Types: Cigarettes  . Smokeless tobacco: Not on file  . Alcohol Use: No      Review of Systems  Gastrointestinal: Positive for abdominal pain.  Musculoskeletal: Positive for back pain.  All other systems reviewed and are negative.    Allergies  Review of patient's allergies indicates no known allergies.  Home Medications   Current Outpatient Rx  Name  Route  Sig  Dispense  Refill  . aspirin 81 MG tablet   Oral   Take 81 mg by mouth daily.           . calcium-vitamin D (OSCAL WITH D) 500-200 MG-UNIT  per tablet   Oral   Take 1 tablet by mouth daily.           . CRESTOR 10 MG tablet      TAKE 1 TABLET DAILY   90 tablet   3   . Omega-3 Fatty Acids (FISH OIL) 300 MG CAPS   Oral   Take by mouth daily.           Marland Kitchen PRILOSEC 40 MG capsule      TAKE (1) CAPSULE DAILY   90 each   3   . naproxen (NAPROSYN) 500 MG tablet   Oral   Take 1 tablet (500 mg total) by mouth 2 (two) times daily with a meal.   30 tablet   0   . tadalafil (CIALIS) 5 MG tablet   Oral   Take 5 mg by mouth daily as needed.           BP 112/71  Pulse 81  Temp(Src) 98.2 F (36.8 C)  Resp 20  SpO2 100%  Physical Exam  Nursing note and vitals reviewed. Constitutional: He appears well-developed and well-nourished. No distress.  HENT:  Head: Normocephalic and atraumatic.  Mouth/Throat: Oropharynx is clear and moist. No oropharyngeal exudate.  Eyes: Conjunctivae and EOM are normal. Pupils are equal, round, and reactive to light. Right eye  exhibits no discharge. Left eye exhibits no discharge. No scleral icterus.  Neck: Normal range of motion. Neck supple. No JVD present. No thyromegaly present.  Cardiovascular: Normal rate, regular rhythm, normal heart sounds and intact distal pulses.  Exam reveals no gallop and no friction rub.   No murmur heard. Pulmonary/Chest: Effort normal and breath sounds normal. No respiratory distress. He has no wheezes. He has no rales.  Abdominal: Soft. Bowel sounds are normal. He exhibits no distension and no mass. There is tenderness (no abd ttp but has + carnett's sign.  no pain in RLQ, no guarding, no masses, no pulsating masses.).  No CVA ttp  Genitourinary:  No hernias, normal appearing Penis, Testicles and sCrotum.  Musculoskeletal: Normal range of motion. He exhibits no edema and no tenderness ( no ttp over the lower back).  Lymphadenopathy:    He has no cervical adenopathy.  Neurological: He is alert. Coordination normal.  Skin: Skin is warm and dry. No rash  noted. No erythema.  Psychiatric: He has a normal mood and affect. His behavior is normal.    ED Course  Procedures (including critical care time)  Labs Reviewed  CBC WITH DIFFERENTIAL - Abnormal; Notable for the following:    WBC 11.5 (*)    Monocytes Absolute 1.1 (*)    All other components within normal limits  COMPREHENSIVE METABOLIC PANEL - Abnormal; Notable for the following:    Glucose, Bld 110 (*)    All other components within normal limits  URINALYSIS, ROUTINE W REFLEX MICROSCOPIC - Abnormal; Notable for the following:    APPearance CLOUDY (*)    Leukocytes, UA TRACE (*)    All other components within normal limits  URINE MICROSCOPIC-ADD ON   US Aorta  06/07/2012  *RADIOLOGY REPORT*  Clinical Data:  62 year old male with abdominal and back pain.  ULTRASOUND OF ABDOMINAL AORTA  Technique:  Ultrasound examination of the abdominal aorta was performed to evaluate for abdominal aortic aneurysm.  Comparison: None  Abdominal Aorta:  No aneurysm identified.        Maximum AP diameter:  2.4 cm.       Maximum TRV diameter:  2.1 cm.  Ectasia of the both common iliac arteries noted measuring 1.4 cm in diameter.  IMPRESSION: No abdominal aortic aneurysm identified.  Ectatic common iliac arteries bilaterally.   Original Report Authenticated By: Harmon Pier, M.D.      1. Abdominal pain       MDM  The pt has abd pain which is unexplained - has no hernias, has no masses and has no dysuria, fever or abn VS.  He has pain which is worsened with sit up and with raising legs.  With back pain will obtain US to r/o AAA - had father who just had ruptured AAA.  Otherwise appears stable.  Labs without acute findings.  There is a slight leukocytosis of 11,500, ultrasound of the aorta does not show any signs of aneurysm, no hematuria or infection, patient stable for discharge with followup, suspect abdominal wall muscle pain, no signs of more significant underlying process, the patient has been given  indications for followup and has expressed his understanding. Naprosyn for home     Vida Roller, MD 06/07/12 1723

## 2012-06-07 NOTE — ED Notes (Signed)
Pt c/o 8/10 right lower abdominal pain and lower back pain. Pt was diagnosed with a hernia six months ago. Pt reports similar pain to pain that he experienced when the hernia was diagnosed.

## 2012-06-07 NOTE — Telephone Encounter (Signed)
Daughter called to state that she believes patient's inguinal hernia has popped out. Daughter states patient has been in pain since yesterday with no relief.  Daughter also reports that patient is unable to stand up straight.  Instructed daughter how to have patient "pop" back in hernia.  Encouraged daughter if she is unable to get the hernia back in to take patient to ED due to symptoms.  Daughter had said patient was seen here a couple years ago by Corliss Skains MD but this RN does not see that patient has ever been seen here in the past.

## 2012-06-07 NOTE — Telephone Encounter (Signed)
Daughter called back to state that patient can not feel any "knot" but he is going to the ED just to be evaluated.  Explained to daughter that he needs to check in as an ED patient and if they feel like we need to be involved they will page [place].  Daughter states understanding and agreeable at this time.

## 2012-06-10 ENCOUNTER — Ambulatory Visit (INDEPENDENT_AMBULATORY_CARE_PROVIDER_SITE_OTHER): Payer: BC Managed Care – PPO | Admitting: Family Medicine

## 2012-06-10 ENCOUNTER — Encounter: Payer: Self-pay | Admitting: Family Medicine

## 2012-06-10 VITALS — BP 128/68 | Temp 97.8°F | Wt 194.0 lb

## 2012-06-10 DIAGNOSIS — R109 Unspecified abdominal pain: Secondary | ICD-10-CM

## 2012-06-10 NOTE — Patient Instructions (Addendum)
Follow up for any recurrent abdominal pain.

## 2012-06-10 NOTE — Progress Notes (Signed)
  Subjective:    Patient ID: Martin Navarro, male    DOB: 02/18/1951, 62 y.o.   MRN: [id number]  HPI Followup ER visit. Notes reviewed. Patient presented on Friday with 2 day history of intermittent somewhat bilateral lower abdominal pain. No injury. Patient denies any fever, nausea, vomiting, dysuria, or change in stool habits No recent appetite or weight changes. Patient had some radiation toward the back area. Pain was worse with extension and relieved somewhat with back flexion.  Patient had workup in the emergency department including abdominal ultrasound to rule out aneurysm which was negative Labs were unremarkable. He has chronic mild elevated white count which was stable at 11.5 thousand. Urinalysis unremarkable  Patient prescribed naproxen and actually slightly improved today. No difficulty with ambulation. No radiculopathy symptoms.  Patient has history of questionable small left inguinal hernia. He did not notice any recent bulge.  Past Medical History  Diagnosis Date  . Asthma   . Emphysema of lung   . Hyperlipidemia   . Erectile dysfunction    No past surgical history on file.  reports that he has been smoking Cigarettes.  He has a 67.5 pack-year smoking history. He does not have any smokeless tobacco history on file. He reports that he does not drink alcohol. His drug history is not on file. family history includes Heart disease (age of onset: 41) in his father and Hypertension in his father. No Known Allergies    Review of Systems  Constitutional: Negative for fever, chills, appetite change and unexpected weight change.  Respiratory: Negative for cough and shortness of breath.   Cardiovascular: Negative for chest pain.  Gastrointestinal: Negative for nausea, vomiting, diarrhea, constipation and abdominal distention.  Endocrine: Negative for polyuria.  Genitourinary: Negative for dysuria.  Musculoskeletal: Negative for arthralgias.  Skin: Negative for rash.        Objective:   Physical Exam  Constitutional: He appears well-developed and well-nourished.  Cardiovascular: Normal rate and regular rhythm.   Pulmonary/Chest: Effort normal and breath sounds normal. No respiratory distress. He has no wheezes. He has no rales.  Abdominal: Soft. Bowel sounds are normal. He exhibits no distension and no mass. There is no tenderness. There is no rebound and no guarding.  Genitourinary:  Probable small left inguinal hernia. Nontender. Soft. No testicle mass. No testicle tenderness          Assessment & Plan:  Transient abdominal pain. Question musculoskeletal. Improved on naproxen. Recent ultrasound unremarkable for aneurysm. Lab work unremarkable. Doubt symptoms related to small left inguinal hernia. Plan- reassurance. Observe for now since improved. Followup promptly for any recurrence

## 2012-06-27 ENCOUNTER — Encounter: Payer: Self-pay | Admitting: Family Medicine

## 2012-06-27 ENCOUNTER — Ambulatory Visit (INDEPENDENT_AMBULATORY_CARE_PROVIDER_SITE_OTHER): Payer: BC Managed Care – PPO | Admitting: Family Medicine

## 2012-06-27 VITALS — BP 110/80 | Temp 98.6°F

## 2012-06-27 DIAGNOSIS — IMO0001 Reserved for inherently not codable concepts without codable children: Secondary | ICD-10-CM

## 2012-06-27 DIAGNOSIS — M7918 Myalgia, other site: Secondary | ICD-10-CM

## 2012-06-27 MED ORDER — PREDNISONE 10 MG PO TABS: ORAL_TABLET | ORAL | Status: DC

## 2012-06-27 MED ORDER — SILDENAFIL CITRATE 100 MG PO TABS: 50.0000 mg | ORAL_TABLET | Freq: Every day | ORAL | Status: AC | PRN

## 2012-06-27 NOTE — Progress Notes (Signed)
  Subjective:    Patient ID: Martin Navarro, male    DOB: May 03, 1950, 62 y.o.   MRN: [id number]  HPI  Acute visit. Patient presents with right buttock pain radiating down to right upper leg Onset about 2 weeks ago. No injury Using naproxen with mild relief. He describes this as sharp pain. Severity is 7-8/10 at worst and currently 4/10 Symptoms occur at rest and occasionally with walking. Occasional night pain. No skin rash. No loss of bladder or bowel control. No numbness. No weakness. No history of chronic back difficulties. Abdominal pain from last visit fully resolved.  Patient denies any claudication symptoms.  Past Medical History  Diagnosis Date  . Asthma   . Emphysema of lung   . Hyperlipidemia   . Erectile dysfunction    No past surgical history on file.  reports that he has been smoking Cigarettes.  He has a 67.5 pack-year smoking history. He does not have any smokeless tobacco history on file. He reports that he does not drink alcohol. His drug history is not on file. family history includes Heart disease (age of onset: 55) in his father and Hypertension in his father. No Known Allergies   Review of Systems  Constitutional: Negative for appetite change and unexpected weight change.  Respiratory: Negative for shortness of breath.   Cardiovascular: Negative for chest pain.  Gastrointestinal: Negative for abdominal pain.  Genitourinary: Negative for dysuria and hematuria.  Neurological: Negative for weakness and numbness.       Objective:   Physical Exam  Constitutional: He appears well-developed and well-nourished.  Cardiovascular: Normal rate and regular rhythm.   Pulmonary/Chest: Effort normal and breath sounds normal. No respiratory distress. He has no wheezes. He has no rales.  Musculoskeletal: He exhibits no edema.  Straight leg raises are negative Full range of motion right hip. No lateral hip tenderness  Neurological:  Full-strength lower  extremities. Symmetric reflexes.          Assessment & Plan:  Right buttock pain. Suspect right lumbar radiculopathy. Nonfocal neurologic exam. This could represent sacroiliac strain but more likely the former. Stop naproxen. Prednisone taper. Touch base 2 weeks if no improvement

## 2012-06-27 NOTE — Patient Instructions (Addendum)
Hold Naproxen while on prednisone  Take prednisone with some food. Call in 2 weeks if no better and sooner if any weakness or numbness.

## 2012-09-04 ENCOUNTER — Ambulatory Visit (INDEPENDENT_AMBULATORY_CARE_PROVIDER_SITE_OTHER): Payer: BC Managed Care – PPO | Admitting: Family Medicine

## 2012-09-04 ENCOUNTER — Encounter: Payer: Self-pay | Admitting: Family Medicine

## 2012-09-04 VITALS — BP 120/72 | Temp 98.6°F | Wt 186.0 lb

## 2012-09-04 DIAGNOSIS — M7071 Other bursitis of hip, right hip: Secondary | ICD-10-CM

## 2012-09-04 DIAGNOSIS — M25559 Pain in unspecified hip: Secondary | ICD-10-CM

## 2012-09-04 DIAGNOSIS — M76899 Other specified enthesopathies of unspecified lower limb, excluding foot: Secondary | ICD-10-CM

## 2012-09-04 DIAGNOSIS — M25551 Pain in right hip: Secondary | ICD-10-CM

## 2012-09-04 NOTE — Patient Instructions (Addendum)
Hip Bursitis Bursitis is a swelling and soreness (inflammation) of a fluid-filled sac (bursa). This sac overlies and protects the joints.  CAUSES   Injury.  Overuse of the muscles surrounding the joint.  Arthritis.  Gout.  Infection.  Cold weather.  Inadequate warm-up and conditioning prior to activities. The cause may not be known.  SYMPTOMS   Mild to severe irritation.  Tenderness and swelling over the outside of the hip.  Pain with motion of the hip.  If the bursa becomes infected, a fever may be present. Redness, tenderness, and warmth will develop over the hip. Symptoms usually lessen in 3 to 4 weeks with treatment, but can come back. TREATMENT If conservative treatment does not work, your caregiver may advise draining the bursa and injecting cortisone into the area. This may speed up the healing process. This may also be used as an initial treatment of choice. HOME CARE INSTRUCTIONS   Apply ice to the affected area for 15-20 minutes every 3 to 4 hours while awake for the first 2 days. Put the ice in a plastic bag and place a towel between the bag of ice and your skin.  Rest the painful joint as much as possible, but continue to put the joint through a normal range of motion at least 4 times per day. When the pain lessens, begin normal, slow movements and usual activities to help prevent stiffness of the hip.  Only take over-the-counter or prescription medicines for pain, discomfort, or fever as directed by your caregiver.  Use crutches to limit weight bearing on the hip joint, if advised.  Elevate your painful hip to reduce swelling. Use pillows for propping and cushioning your legs and hips.  Gentle massage may provide comfort and decrease swelling. SEEK IMMEDIATE MEDICAL CARE IF:   Your pain increases even during treatment, or you are not improving.  You have a fever.  You have heat and inflammation over the involved bursa.  You have any other questions or  concerns. MAKE SURE YOU:   Understand these instructions.  Will watch your condition.  Will get help right away if you are not doing well or get worse. Document Released: 09/02/2001 Document Revised: 06/05/2011 Document Reviewed: 04/01/2008 [facility] Patient Information 2014 [place], [place].  Touch base by next week if no better

## 2012-09-04 NOTE — Progress Notes (Addendum)
  Subjective:    Patient ID: Martin Navarro, male    DOB: 1950/04/27, 62 y.o.   MRN: [id number]  HPI Right lateral hip pain. Seen back in April with concern for right radiculopathy pain. Prednisone helped only briefly. Denies any lumbar back pain. Current pain is located lateral hip only. Symptoms are worse at night and after prolonged periods of inactivity and x-ray improved after walking and moving around. Denies any lower extremity weakness or numbness. Currently no buttock pain. No urine or stool incontinence No relief with anti-inflammatory medications. Has not tried icing. No history of injury. Pain does not radiate. Pain severity is moderate  Denies fever, chills, night sweats, appetite or weight changes, or any skin rashes  Past Medical History  Diagnosis Date  . Asthma   . Emphysema of lung   . Hyperlipidemia   . Erectile dysfunction    No past surgical history on file.  reports that he has been smoking Cigarettes.  He has a 67.5 pack-year smoking history. He does not have any smokeless tobacco history on file. He reports that he does not drink alcohol. His drug history is not on file. family history includes Heart disease (age of onset: 68) in his father and Hypertension in his father. No Known Allergies     Review of Systems  Constitutional: Negative for fever and chills.  Neurological: Negative for weakness and numbness.  Hematological: Negative for adenopathy.       Objective:   Physical Exam  Constitutional: He appears well-developed and well-nourished.  Cardiovascular: Normal rate and regular rhythm.   Musculoskeletal:  Right hip reveals excellent range of motion. Negative straight leg raise. Tenderness localized over greater trochanteric bursa.  Neurological:  No focal weakness lower extremity. Deep tender reflexes knee and ankle are symmetric no sensory deficits          Assessment & Plan:  Right lateral hip pain    Right lateral hip pain.  Current pain does not sounds much like lumbar radiculopathy. Has localized tenderness over trochanteric bursa. No relief with oral anti-inflammatory medications. We recommend consideration for injection with cortisone over bursa and after discussing risk and benefits patient consents.  Right lateral hip over greater trochanteric bursa prepped with Betadine. Using sterile technique injected 40 mg Depo-Medrol and 2 cc of plain Xylocaine using 25-gauge 1 inch needle. Patient tolerated well. Touch base next week if not seeing great improvement. Obtain hip films and consider lumbar films if not improving by next week  Patient states injection did not help. Obtain x-rays of right hip and lumbar spine films

## 2012-09-05 ENCOUNTER — Ambulatory Visit: Payer: BC Managed Care – PPO | Admitting: Family Medicine

## 2012-09-13 ENCOUNTER — Encounter: Payer: Self-pay | Admitting: Family Medicine

## 2012-09-18 NOTE — Addendum Note (Signed)
Addended by: Kristian Covey on: 09/18/2012 08:39 AM   Modules accepted: Orders

## 2012-10-31 ENCOUNTER — Other Ambulatory Visit: Payer: BC Managed Care – PPO

## 2012-11-01 ENCOUNTER — Other Ambulatory Visit (INDEPENDENT_AMBULATORY_CARE_PROVIDER_SITE_OTHER): Payer: BC Managed Care – PPO

## 2012-11-01 DIAGNOSIS — Z0000 Encounter for general adult medical examination without abnormal findings: Secondary | ICD-10-CM

## 2012-11-01 LAB — LIPID PANEL
Cholesterol: 122 mg/dL (ref 0–200)
LDL Cholesterol: 74 mg/dL (ref 0–99)
Total CHOL/HDL Ratio: 4
VLDL: 17.8 mg/dL (ref 0.0–40.0)

## 2012-11-01 LAB — HEPATIC FUNCTION PANEL
ALT: 15 U/L (ref 0–53)
AST: 14 U/L (ref 0–37)
Albumin: 3.4 g/dL — ABNORMAL LOW (ref 3.5–5.2)
Alkaline Phosphatase: 63 U/L (ref 39–117)
Bilirubin, Direct: 0.1 mg/dL (ref 0.0–0.3)
Total Bilirubin: 0.4 mg/dL (ref 0.3–1.2)
Total Protein: 6.1 g/dL (ref 6.0–8.3)

## 2012-11-01 LAB — BASIC METABOLIC PANEL
BUN: 12 mg/dL (ref 6–23)
Chloride: 110 mEq/L (ref 96–112)
GFR: 96.64 mL/min (ref >60.00)
Glucose, Bld: 102 mg/dL — ABNORMAL HIGH (ref 70–99)
Potassium: 4.1 mEq/L (ref 3.5–5.1)

## 2012-11-01 LAB — CBC WITH DIFFERENTIAL/PLATELET
Basophils Absolute: 0.1 10*3/uL (ref 0.0–0.1)
Eosinophils Absolute: 0.3 10*3/uL (ref 0.0–0.7)
HCT: 42.7 % (ref 39.0–52.0)
Lymphs Abs: 3.1 10*3/uL (ref 0.7–4.0)
MCV: 94.7 fl (ref 78.0–100.0)
Monocytes Absolute: 1.1 10*3/uL — ABNORMAL HIGH (ref 0.1–1.0)
Platelets: 209 10*3/uL (ref 150.0–400.0)
RDW: 14.1 % (ref 11.5–14.6)

## 2012-11-01 NOTE — Addendum Note (Signed)
Addended by: Serena Colonel on: 11/01/2012 08:59 AM   Modules accepted: Orders

## 2012-11-07 ENCOUNTER — Encounter: Payer: Self-pay | Admitting: Family Medicine

## 2012-11-07 ENCOUNTER — Ambulatory Visit (INDEPENDENT_AMBULATORY_CARE_PROVIDER_SITE_OTHER): Payer: BC Managed Care – PPO | Admitting: Family Medicine

## 2012-11-07 VITALS — BP 128/80 | HR 69 | Temp 98.0°F | Wt 186.0 lb

## 2012-11-07 DIAGNOSIS — Z0000 Encounter for general adult medical examination without abnormal findings: Secondary | ICD-10-CM

## 2012-11-07 NOTE — Patient Instructions (Addendum)
Try over-the-counter chlorpheniramine 4 nighttime postnasal drip symptoms Recommend yearly flu vaccine Reduce sugar and white starch intake  Hyperglycemia Hyperglycemia occurs when the glucose (sugar) in your blood is too high. Hyperglycemia can happen for many reasons, but it most often happens to people who do not know they have diabetes or are not managing their diabetes properly.  CAUSES  Whether you have diabetes or not, there are other causes of hyperglycemia. Hyperglycemia can occur when you have diabetes, but it can also occur in other situations that you might not be as aware of, such as: Diabetes  If you have diabetes and are having problems controlling your blood glucose, hyperglycemia could occur because of some of the following reasons:  Not following your meal plan.  Not taking your diabetes medications or not taking it properly.  Exercising less or doing less activity than you normally do.  Being sick. Pre-diabetes  This cannot be ignored. Before people develop Type 2 diabetes, they almost always have "pre-diabetes." This is when your blood glucose levels are higher than normal, but not yet high enough to be diagnosed as diabetes. Research has shown that some long-term damage to the body, especially the heart and circulatory system, may already be occurring during pre-diabetes. If you take action to manage your blood glucose when you have pre-diabetes, you may delay or prevent Type 2 diabetes from developing. Stress  If you have diabetes, you may be "diet" controlled or on oral medications or insulin to control your diabetes. However, you may find that your blood glucose is higher than usual in the hospital whether you have diabetes or not. This is often referred to as "stress hyperglycemia." Stress can elevate your blood glucose. This happens because of hormones put out by the body during times of stress. If stress has been the cause of your high blood glucose, it can be  followed regularly by your caregiver. That way he/she can make sure your hyperglycemia does not continue to get worse or progress to diabetes. Steroids  Steroids are medications that act on the infection fighting system (immune system) to block inflammation or infection. One side effect can be a rise in blood glucose. Most people can produce enough extra insulin to allow for this rise, but for those who cannot, steroids make blood glucose levels go even higher. It is not unusual for steroid treatments to "uncover" diabetes that is developing. It is not always possible to determine if the hyperglycemia will go away after the steroids are stopped. A special blood test called an A1c is sometimes done to determine if your blood glucose was elevated before the steroids were started. SYMPTOMS  Thirsty.  Frequent urination.  Dry mouth.  Blurred vision.  Tired or fatigue.  Weakness.  Sleepy.  Tingling in feet or leg. DIAGNOSIS  Diagnosis is made by monitoring blood glucose in one or all of the following ways:  A1c test. This is a chemical found in your blood.  Fingerstick blood glucose monitoring.  Laboratory results. TREATMENT  First, knowing the cause of the hyperglycemia is important before the hyperglycemia can be treated. Treatment may include, but is not be limited to:  Education.  Change or adjustment in medications.  Change or adjustment in meal plan.  Treatment for an illness, infection, etc.  More frequent blood glucose monitoring.  Change in exercise plan.  Decreasing or stopping steroids.  Lifestyle changes. HOME CARE INSTRUCTIONS   Test your blood glucose as directed.  Exercise regularly. Your caregiver will give  you instructions about exercise. Pre-diabetes or diabetes which comes on with stress is helped by exercising.  Eat wholesome, balanced meals. Eat often and at regular, fixed times. Your caregiver or nutritionist will give you a meal plan to guide  your sugar intake.  Being at an ideal weight is important. If needed, losing as little as 10 to 15 pounds may help improve blood glucose levels. SEEK MEDICAL CARE IF:   You have questions about medicine, activity, or diet.  You continue to have symptoms (problems such as increased thirst, urination, or weight gain). SEEK IMMEDIATE MEDICAL CARE IF:   You are vomiting or have diarrhea.  Your breath smells fruity.  You are breathing faster or slower.  You are very sleepy or incoherent.  You have numbness, tingling, or pain in your feet or hands.  You have chest pain.  Your symptoms get worse even though you have been following your caregiver's orders.  If you have any other questions or concerns. Document Released: 09/06/2000 Document Revised: 06/05/2011 Document Reviewed: 07/10/2011 [facility] Patient Information 2014 [place], [place].

## 2012-11-07 NOTE — Progress Notes (Signed)
  Subjective:    Patient ID: Martin Navarro, male    DOB: 1950-11-30, 62 y.o.   MRN: [id number]  HPI  Patient here for complete physical Chronic problems include history of nicotine use, hypogonadism, GERD, dyslipidemia, erectile dysfunction. He remains on Crestor for hyperlipidemia. Also takes Prilosec for reflux and those symptoms are well controlled.  He does have frequent cough at night which he thinks is postnasal drip related. He still smoking. Immunizations are up-to-date. Colonoscopy 2010.  Past Medical History  Diagnosis Date  . Asthma   . Emphysema of lung   . Hyperlipidemia   . Erectile dysfunction    No past surgical history on file.  reports that he has been smoking Cigarettes.  He has a 67.5 pack-year smoking history. He does not have any smokeless tobacco history on file. He reports that he does not drink alcohol. His drug history is not on file. family history includes Heart disease (age of onset: 53) in his father; Hypertension in his father. No Known Allergies   Review of Systems  Constitutional: Negative for fever, activity change, appetite change and fatigue.  HENT: Positive for postnasal drip. Negative for ear pain, congestion, trouble swallowing and voice change.   Eyes: Negative for pain and visual disturbance.  Respiratory: Positive for cough. Negative for shortness of breath and wheezing.   Cardiovascular: Negative for chest pain and palpitations.  Gastrointestinal: Negative for nausea, vomiting, abdominal pain, diarrhea, constipation, blood in stool, abdominal distention and rectal pain.  Genitourinary: Negative for dysuria, hematuria and testicular pain.  Musculoskeletal: Negative for joint swelling and arthralgias.  Skin: Negative for rash.  Neurological: Negative for dizziness, syncope and headaches.  Hematological: Negative for adenopathy.  Psychiatric/Behavioral: Negative for confusion and dysphoric mood.       Objective:   Physical Exam   Constitutional: He is oriented to person, place, and time. He appears well-developed and well-nourished. No distress.  HENT:  Head: Normocephalic and atraumatic.  Right Ear: External ear normal.  Left Ear: External ear normal.  Mouth/Throat: Oropharynx is clear and moist.  Eyes: Conjunctivae and EOM are normal. Pupils are equal, round, and reactive to light.  Neck: Normal range of motion. Neck supple. No thyromegaly present.  Cardiovascular: Normal rate, regular rhythm and normal heart sounds.   No murmur heard. Pulmonary/Chest: No respiratory distress. He has no wheezes. He has no rales.  Abdominal: Soft. Bowel sounds are normal. He exhibits no distension and no mass. There is no tenderness. There is no rebound and no guarding.  Musculoskeletal: He exhibits no edema.  Lymphadenopathy:    He has no cervical adenopathy.  Neurological: He is alert and oriented to person, place, and time. He displays normal reflexes. No cranial nerve deficit.  Skin: No rash noted.  Psychiatric: He has a normal mood and affect.          Assessment & Plan:  Complete physical. Continue yearly flu vaccine. Try chlorpheniramine for nighttime postnasal drip symptoms. Continue Crestor aspirin and Prilosec. Labs reviewed with patient. He has probable prediabetes. Reduce sugar and white starch intake.

## 2012-12-24 ENCOUNTER — Other Ambulatory Visit: Payer: Self-pay | Admitting: Family Medicine

## 2013-01-30 ENCOUNTER — Other Ambulatory Visit: Payer: Self-pay

## 2013-05-15 ENCOUNTER — Ambulatory Visit (INDEPENDENT_AMBULATORY_CARE_PROVIDER_SITE_OTHER): Payer: BC Managed Care – PPO | Admitting: Family Medicine

## 2013-05-15 ENCOUNTER — Encounter: Payer: Self-pay | Admitting: Family Medicine

## 2013-05-15 VITALS — BP 130/88 | HR 73 | Temp 98.3°F | Wt 187.0 lb

## 2013-05-15 DIAGNOSIS — J029 Acute pharyngitis, unspecified: Secondary | ICD-10-CM

## 2013-05-15 DIAGNOSIS — B9789 Other viral agents as the cause of diseases classified elsewhere: Principal | ICD-10-CM

## 2013-05-15 DIAGNOSIS — J069 Acute upper respiratory infection, unspecified: Secondary | ICD-10-CM

## 2013-05-15 LAB — POCT RAPID STREP A (OFFICE): RAPID STREP A SCREEN: NEGATIVE

## 2013-05-15 MED ORDER — HYDROCODONE-HOMATROPINE 5-1.5 MG/5ML PO SYRP: 5.0000 mL | ORAL_SOLUTION | Freq: Four times a day (QID) | ORAL | Status: AC | PRN

## 2013-05-15 NOTE — Progress Notes (Signed)
   Subjective:    Patient ID: Martin Navarro, male    DOB: 10/15/1950, 63 y.o.   MRN: [id number]  Sore Throat  Associated symptoms include coughing. Pertinent negatives include no shortness of breath or trouble swallowing.   Acute visit Patient developed sore throat about 3 weeks ago. Still some off and on sore throat symptoms. About one week ago developed some cough and rhinorrhea. No fever. No body aches. Minimal postnasal drip symptoms. Denies GERD symptoms. Cough is mostly nonproductive and especially bothersome at night. He's tried some Mucinex DM without much relief.  Past Medical History  Diagnosis Date  . Asthma   . Emphysema of lung   . Hyperlipidemia   . Erectile dysfunction    No past surgical history on file.  reports that he has been smoking Cigarettes.  He has a 67.5 pack-year smoking history. He does not have any smokeless tobacco history on file. He reports that he does not drink alcohol. His drug history is not on file. family history includes Heart disease (age of onset: 73) in his father; Hypertension in his father. No Known Allergies    Review of Systems  Constitutional: Positive for fatigue. Negative for fever, chills, appetite change and unexpected weight change.  HENT: Positive for postnasal drip and sore throat. Negative for sinus pressure, trouble swallowing and voice change.   Respiratory: Positive for cough. Negative for shortness of breath and wheezing.   Cardiovascular: Negative for chest pain.       Objective:   Physical Exam  Constitutional: He appears well-developed and well-nourished.  HENT:  Right Ear: External ear normal.  Left Ear: External ear normal.  Mouth/Throat: Oropharynx is clear and moist.  Neck: Neck supple.  Cardiovascular: Normal rate.   Pulmonary/Chest: Effort normal and breath sounds normal. No respiratory distress. He has no wheezes. He has no rales.  Lymphadenopathy:    He has no cervical adenopathy.            Assessment & Plan:  Viral URI with cough. Nonfocal exam. Treat cough symptomatically with Hycodan cough syrup 1 teaspoon each bedtime. Try Mucinex. Followup for fever or worsening symptoms Rapid strep is negative.

## 2013-05-15 NOTE — Progress Notes (Signed)
Pre visit review using our clinic review tool, if applicable. No additional management support is needed unless otherwise documented below in the visit note. 

## 2013-05-15 NOTE — Patient Instructions (Signed)
Acute Bronchitis Bronchitis is inflammation of the airways that extend from the windpipe into the lungs (bronchi). The inflammation often causes mucus to develop. This leads to a cough, which is the most common symptom of bronchitis.  In acute bronchitis, the condition usually develops suddenly and goes away over time, usually in a couple weeks. Smoking, allergies, and asthma can make bronchitis worse. Repeated episodes of bronchitis may cause further lung problems.  CAUSES Acute bronchitis is most often caused by the same virus that causes a cold. The virus can spread from person to person (contagious).  SIGNS AND SYMPTOMS   Cough.   Fever.   Coughing up mucus.   Body aches.   Chest congestion.   Chills.   Shortness of breath.   Sore throat.  DIAGNOSIS  Acute bronchitis is usually diagnosed through a physical exam. Tests, such as chest X-rays, are sometimes done to rule out other conditions.  TREATMENT  Acute bronchitis usually goes away in a couple weeks. Often times, no medical treatment is necessary. Medicines are sometimes given for relief of fever or cough. Antibiotics are usually not needed but may be prescribed in certain situations. In some cases, an inhaler may be recommended to help reduce shortness of breath and control the cough. A cool mist vaporizer may also be used to help thin bronchial secretions and make it easier to clear the chest.  HOME CARE INSTRUCTIONS  Get plenty of rest.   Drink enough fluids to keep your urine clear or pale yellow (unless you have a medical condition that requires fluid restriction). Increasing fluids may help thin your secretions and will prevent dehydration.   Only take over-the-counter or prescription medicines as directed by your health care provider.   Avoid smoking and secondhand smoke. Exposure to cigarette smoke or irritating chemicals will make bronchitis worse. If you are a smoker, consider using nicotine gum or skin  patches to help control withdrawal symptoms. Quitting smoking will help your lungs heal faster.   Reduce the chances of another bout of acute bronchitis by washing your hands frequently, avoiding people with cold symptoms, and trying not to touch your hands to your mouth, nose, or eyes.   Follow up with your health care provider as directed.  SEEK MEDICAL CARE IF: Your symptoms do not improve after 1 week of treatment.  SEEK IMMEDIATE MEDICAL CARE IF:  You develop an increased fever or chills.   You have chest pain.   You have severe shortness of breath.  You have bloody sputum.   You develop dehydration.  You develop fainting.  You develop repeated vomiting.  You develop a severe headache. MAKE SURE YOU:   Understand these instructions.  Will watch your condition.  Will get help right away if you are not doing well or get worse. Document Released: 04/20/2004 Document Revised: 11/13/2012 Document Reviewed: 09/03/2012 [employer] Patient Information 2014 [employer].  

## 2013-05-16 ENCOUNTER — Telehealth: Payer: Self-pay | Admitting: Family Medicine

## 2013-05-16 NOTE — Telephone Encounter (Signed)
Relevant patient education assigned to patient using Emmi. ° °

## 2013-10-15 ENCOUNTER — Ambulatory Visit (INDEPENDENT_AMBULATORY_CARE_PROVIDER_SITE_OTHER)
Admission: RE | Admit: 2013-10-15 | Discharge: 2013-10-15 | Disposition: A | Discharge status: Home / Self Care | Payer: BC Managed Care – PPO | Source: Ambulatory Visit | Attending: Family Medicine | Admitting: Family Medicine

## 2013-10-15 ENCOUNTER — Ambulatory Visit (INDEPENDENT_AMBULATORY_CARE_PROVIDER_SITE_OTHER): Payer: BC Managed Care – PPO | Admitting: Family Medicine

## 2013-10-15 ENCOUNTER — Encounter: Payer: Self-pay | Admitting: Family Medicine

## 2013-10-15 VITALS — BP 128/70 | HR 79 | Temp 98.6°F | Wt 178.0 lb

## 2013-10-15 DIAGNOSIS — R05 Cough: Secondary | ICD-10-CM

## 2013-10-15 DIAGNOSIS — R053 Chronic cough: Secondary | ICD-10-CM

## 2013-10-15 DIAGNOSIS — R059 Cough, unspecified: Secondary | ICD-10-CM

## 2013-10-15 NOTE — Progress Notes (Signed)
Pre visit review using our clinic review tool, if applicable. No additional management support is needed unless otherwise documented below in the visit note. 

## 2013-10-15 NOTE — Progress Notes (Signed)
   Subjective:    Patient ID: Martin Navarro, male    DOB: 30-May-1950, 63 y.o.   MRN: [id number]  Cough Pertinent negatives include no chest pain, chills, fever, postnasal drip, shortness of breath or wheezing.   Patient presents with chronic cough. Duration 3-4 months. His cough is mostly nonproductive and somewhat intermittent. Tends to be worse at night. He does have history of GERD and takes omeprazole regularly. No active GERD symptoms. He denies any appetite change. He has had some weight loss of about 9 pounds since last visit but he attributes this to being more active. No dyspnea. No hemoptysis. No fevers or chills. No postnasal drip symptoms. No obvious wheezing. Patient does have a long history of smoking  No ACE inhibitor use. No clear exacerbating factors other than he seems to have these symptoms more at night supine. No alleviating factors. Prior chest x-rays have shown COPD/emphysema changes.  Past Medical History  Diagnosis Date  . Asthma   . Emphysema of lung   . Hyperlipidemia   . Erectile dysfunction    No past surgical history on file.  reports that he has been smoking Cigarettes.  He has a 67.5 pack-year smoking history. He does not have any smokeless tobacco history on file. He reports that he does not drink alcohol. His drug history is not on file. family history includes Heart disease (age of onset: 52) in his father; Hypertension in his father. No Known Allergies   Review of Systems  Constitutional: Negative for fever, chills, appetite change, fatigue and unexpected weight change.  HENT: Negative for congestion, postnasal drip, sinus pressure and voice change.   Respiratory: Positive for cough. Negative for shortness of breath and wheezing.   Cardiovascular: Negative for chest pain, palpitations and leg swelling.  Hematological: Negative for adenopathy.       Objective:   Physical Exam  Constitutional: He appears well-developed and well-nourished.  HENT:    Right Ear: External ear normal.  Left Ear: External ear normal.  Mouth/Throat: Oropharynx is clear and moist.  Neck: Neck supple. No thyromegaly present.  Cardiovascular: Normal rate and regular rhythm.   Pulmonary/Chest: Effort normal and breath sounds normal. No respiratory distress. He has no wheezes. He has no rales.          Assessment & Plan:  Chronic cough. Question history of COPD. Long history of smoking. Question GERD related but he is currently taking Prilosec regularly. No obvious postnasal drip symptoms. Chest x-ray. Elevate head of bed 6-8 inches. Continue Prilosec. Consider getting back on long-acting bronchodilator

## 2013-10-15 NOTE — Patient Instructions (Signed)
Consider elevate head of bed 6 to 8 inches. Try to scale back caffeine use.

## 2013-11-05 ENCOUNTER — Other Ambulatory Visit (INDEPENDENT_AMBULATORY_CARE_PROVIDER_SITE_OTHER): Payer: BC Managed Care – PPO

## 2013-11-05 DIAGNOSIS — Z0000 Encounter for general adult medical examination without abnormal findings: Secondary | ICD-10-CM

## 2013-11-05 LAB — BASIC METABOLIC PANEL
BUN: 12 mg/dL (ref 6–23)
CHLORIDE: 106 meq/L (ref 96–112)
CO2: 28 meq/L (ref 19–32)
CREATININE: 0.8 mg/dL (ref 0.4–1.5)
Calcium: 9.2 mg/dL (ref 8.4–10.5)
GFR: 99.01 mL/min (ref >60.00)
Glucose, Bld: 97 mg/dL (ref 70–99)
Potassium: 4.2 mEq/L (ref 3.5–5.1)
Sodium: 140 mEq/L (ref 135–145)

## 2013-11-05 LAB — CBC WITH DIFFERENTIAL/PLATELET
BASOS PCT: 0.6 % (ref 0.0–3.0)
Basophils Absolute: 0.1 10*3/uL (ref 0.0–0.1)
EOS ABS: 0.3 10*3/uL (ref 0.0–0.7)
Eosinophils Relative: 2.5 % (ref 0.0–5.0)
HCT: 42.1 % (ref 39.0–52.0)
Hemoglobin: 14.2 g/dL (ref 13.0–17.0)
LYMPHS ABS: 2.7 10*3/uL (ref 0.7–4.0)
Lymphocytes Relative: 25.5 % (ref 12.0–46.0)
MCHC: 33.7 g/dL (ref 30.0–36.0)
MCV: 92.6 fl (ref 78.0–100.0)
MONO ABS: 1 10*3/uL (ref 0.1–1.0)
Monocytes Relative: 9.6 % (ref 3.0–12.0)
NEUTROS PCT: 61.8 % (ref 43.0–77.0)
Neutro Abs: 6.5 10*3/uL (ref 1.4–7.7)
Platelets: 202 10*3/uL (ref 150.0–400.0)
RBC: 4.55 Mil/uL (ref 4.22–5.81)
RDW: 14.4 % (ref 11.5–15.5)
WBC: 10.5 10*3/uL (ref 4.0–10.5)

## 2013-11-05 LAB — HEPATIC FUNCTION PANEL
ALBUMIN: 3.6 g/dL (ref 3.5–5.2)
ALT: 16 U/L (ref 0–53)
AST: 16 U/L (ref 0–37)
Alkaline Phosphatase: 63 U/L (ref 39–117)
BILIRUBIN DIRECT: 0.1 mg/dL (ref 0.0–0.3)
TOTAL PROTEIN: 6.4 g/dL (ref 6.0–8.3)
Total Bilirubin: 0.7 mg/dL (ref 0.2–1.2)

## 2013-11-05 LAB — TSH: TSH: 1.59 u[IU]/mL (ref 0.35–4.50)

## 2013-11-05 LAB — LIPID PANEL
CHOL/HDL RATIO: 4
Cholesterol: 156 mg/dL (ref 0–200)
HDL: 36.6 mg/dL — ABNORMAL LOW (ref >39.00)
LDL Cholesterol: 101 mg/dL — ABNORMAL HIGH (ref 0–99)
NONHDL: 119.4
Triglycerides: 94 mg/dL (ref 0.0–149.0)
VLDL: 18.8 mg/dL (ref 0.0–40.0)

## 2013-11-05 LAB — PSA: PSA: 0.7 ng/mL (ref 0.10–4.00)

## 2013-11-05 LAB — POCT URINALYSIS DIPSTICK
BILIRUBIN UA: NEGATIVE
Glucose, UA: NEGATIVE
Ketones, UA: NEGATIVE
LEUKOCYTES UA: NEGATIVE
NITRITE UA: NEGATIVE
PH UA: 5.5
PROTEIN UA: NEGATIVE
Spec Grav, UA: 1.02
UROBILINOGEN UA: 0.2

## 2013-11-13 ENCOUNTER — Encounter: Payer: Self-pay | Admitting: Family Medicine

## 2013-11-13 ENCOUNTER — Ambulatory Visit (INDEPENDENT_AMBULATORY_CARE_PROVIDER_SITE_OTHER): Payer: BC Managed Care – PPO | Admitting: Family Medicine

## 2013-11-13 VITALS — BP 130/80 | HR 70 | Temp 97.5°F | Ht 64.0 in | Wt 179.0 lb

## 2013-11-13 DIAGNOSIS — R319 Hematuria, unspecified: Secondary | ICD-10-CM

## 2013-11-13 DIAGNOSIS — Z0000 Encounter for general adult medical examination without abnormal findings: Secondary | ICD-10-CM

## 2013-11-13 LAB — POCT URINALYSIS DIPSTICK
BILIRUBIN UA: NEGATIVE
GLUCOSE UA: NEGATIVE
Ketones, UA: NEGATIVE
LEUKOCYTES UA: NEGATIVE
NITRITE UA: NEGATIVE
Protein, UA: NEGATIVE
Spec Grav, UA: 1.025
UROBILINOGEN UA: 0.2
pH, UA: 6

## 2013-11-13 LAB — URINALYSIS, MICROSCOPIC ONLY: RBC / HPF: NONE SEEN (ref 0–?)

## 2013-11-13 NOTE — Progress Notes (Signed)
   Subjective:    Patient ID: Martin Navarro, male    DOB: 1950/07/05, 63 y.o.   MRN: [id number]  HPI Here for complete physical. He has history of hyperlipidemia, GERD, hypogonadism. He still smokes but has transitioned to electronic cigarettes but has scaled back. He takes Crestor for hyperlipidemia. No history of CAD or peripheral vascular disease. No recent chest pains. No claudication symptoms. Cough from last visit has improved.  Colonoscopy 5 years ago. He attributed this year. Shingles vaccine 3 years ago. Tetanus up-to-date. Patient declines flu vaccine.  Past Medical History  Diagnosis Date  . Asthma   . Emphysema of lung   . Hyperlipidemia   . Erectile dysfunction    No past surgical history on file.  reports that he has been smoking Cigarettes.  He has a 67.5 pack-year smoking history. He does not have any smokeless tobacco history on file. He reports that he does not drink alcohol. His drug history is not on file. family history includes Heart disease (age of onset: 32) in his father; Hypertension in his father. No Known Allergies    Review of Systems  Constitutional: Negative for fever, activity change, appetite change and fatigue.  HENT: Negative for congestion, ear pain and trouble swallowing.   Eyes: Negative for pain and visual disturbance.  Respiratory: Negative for cough, shortness of breath and wheezing.   Cardiovascular: Negative for chest pain and palpitations.  Gastrointestinal: Negative for nausea, vomiting, abdominal pain, diarrhea, constipation, blood in stool, abdominal distention and rectal pain.  Endocrine: Negative for polydipsia and polyuria.  Genitourinary: Negative for dysuria, hematuria and testicular pain.  Musculoskeletal: Negative for arthralgias and joint swelling.  Skin: Negative for rash.  Neurological: Negative for dizziness, syncope and headaches.  Hematological: Negative for adenopathy.  Psychiatric/Behavioral: Negative for confusion and  dysphoric mood.       Objective:   Physical Exam  Constitutional: He is oriented to person, place, and time. He appears well-developed and well-nourished. No distress.  HENT:  Head: Normocephalic and atraumatic.  Right Ear: External ear normal.  Left Ear: External ear normal.  Mouth/Throat: Oropharynx is clear and moist.  Eyes: Conjunctivae and EOM are normal. Pupils are equal, round, and reactive to light.  Neck: Normal range of motion. Neck supple. No thyromegaly present.  Cardiovascular: Normal rate, regular rhythm and normal heart sounds.   No murmur heard. Pulmonary/Chest: No respiratory distress. He has no wheezes. He has no rales.  Abdominal: Soft. Bowel sounds are normal. He exhibits no distension and no mass. There is no tenderness. There is no rebound and no guarding.  Musculoskeletal: He exhibits no edema.  Lymphadenopathy:    He has no cervical adenopathy.  Neurological: He is alert and oriented to person, place, and time. He displays normal reflexes. No cranial nerve deficit.  Skin: No rash noted.  Significant freckling of skin but no concerning lesions. He has followup with dermatologist next month  Psychiatric: He has a normal mood and affect.          Assessment & Plan:  Complete physical. Labs reviewed. He has trace hematuria on urine dipstick otherwise normal. Repeat urine and Micro if still positive. Repeat colonoscopy is being scheduled. Nicotine cessation discussed.  Flu vaccine recommended and declined

## 2013-11-13 NOTE — Progress Notes (Signed)
Pre visit review using our clinic review tool, if applicable. No additional management support is needed unless otherwise documented below in the visit note. 

## 2013-11-13 NOTE — Addendum Note (Signed)
Addended by: Marcina Millard on: 11/13/2013 09:48 AM   Modules accepted: Orders

## 2013-11-13 NOTE — Patient Instructions (Signed)
Smoking Cessation Quitting smoking is important to your health and has many advantages. However, it is not always easy to quit since nicotine is a very addictive drug. Oftentimes, people try 3 times or more before being able to quit. This document explains the best ways for you to prepare to quit smoking. Quitting takes hard work and a lot of effort, but you can do it. ADVANTAGES OF QUITTING SMOKING  You will live longer, feel better, and live better.  Your body will feel the impact of quitting smoking almost immediately.  Within 20 minutes, blood pressure decreases. Your pulse returns to its normal level.  After 8 hours, carbon monoxide levels in the blood return to normal. Your oxygen level increases.  After 24 hours, the chance of having a heart attack starts to decrease. Your breath, hair, and body stop smelling like smoke.  After 48 hours, damaged nerve endings begin to recover. Your sense of taste and smell improve.  After 72 hours, the body is virtually free of nicotine. Your bronchial tubes relax and breathing becomes easier.  After 2 to 12 weeks, lungs can hold more air. Exercise becomes easier and circulation improves.  The risk of having a heart attack, stroke, cancer, or lung disease is greatly reduced.  After 1 year, the risk of coronary heart disease is cut in half.  After 5 years, the risk of stroke falls to the same as a nonsmoker.  After 10 years, the risk of lung cancer is cut in half and the risk of other cancers decreases significantly.  After 15 years, the risk of coronary heart disease drops, usually to the level of a nonsmoker.  If you are pregnant, quitting smoking will improve your chances of having a healthy baby.  The people you live with, especially any children, will be healthier.  You will have extra money to spend on things other than cigarettes. QUESTIONS TO THINK ABOUT BEFORE ATTEMPTING TO QUIT You may want to talk about your answers with your  health care provider.  Why do you want to quit?  If you tried to quit in the past, what helped and what did not?  What will be the most difficult situations for you after you quit? How will you plan to handle them?  Who can help you through the tough times? Your family? Friends? A health care provider?  What pleasures do you get from smoking? What ways can you still get pleasure if you quit? Here are some questions to ask your health care provider:  How can you help me to be successful at quitting?  What medicine do you think would be best for me and how should I take it?  What should I do if I need more help?  What is smoking withdrawal like? How can I get information on withdrawal? GET READY  Set a quit date.  Change your environment by getting rid of all cigarettes, ashtrays, matches, and lighters in your home, car, or work. Do not let people smoke in your home.  Review your past attempts to quit. Think about what worked and what did not. GET SUPPORT AND ENCOURAGEMENT You have a better chance of being successful if you have help. You can get support in many ways.  Tell your family, friends, and coworkers that you are going to quit and need their support. Ask them not to smoke around you.  Get individual, group, or telephone counseling and support. Programs are available at local hospitals and health centers. Call   your local health department for information about programs in your area.  Spiritual beliefs and practices may help some smokers quit.  Download a "quit meter" on your computer to keep track of quit statistics, such as how long you have gone without smoking, cigarettes not smoked, and money saved.  Get a self-help book about quitting smoking and staying off tobacco. LEARN NEW SKILLS AND BEHAVIORS  Distract yourself from urges to smoke. Talk to someone, go for a walk, or occupy your time with a task.  Change your normal routine. Take a different route to work.  Drink tea instead of coffee. Eat breakfast in a different place.  Reduce your stress. Take a hot bath, exercise, or read a book.  Plan something enjoyable to do every day. Reward yourself for not smoking.  Explore interactive web-based programs that specialize in helping you quit. GET MEDICINE AND USE IT CORRECTLY Medicines can help you stop smoking and decrease the urge to smoke. Combining medicine with the above behavioral methods and support can greatly increase your chances of successfully quitting smoking.  Nicotine replacement therapy helps deliver nicotine to your body without the negative effects and risks of smoking. Nicotine replacement therapy includes nicotine gum, lozenges, inhalers, nasal sprays, and skin patches. Some may be available over-the-counter and others require a prescription.  Antidepressant medicine helps people abstain from smoking, but how this works is unknown. This medicine is available by prescription.  Nicotinic receptor partial agonist medicine simulates the effect of nicotine in your brain. This medicine is available by prescription. Ask your health care provider for advice about which medicines to use and how to use them based on your health history. Your health care provider will tell you what side effects to look out for if you choose to be on a medicine or therapy. Carefully read the information on the package. Do not use any other product containing nicotine while using a nicotine replacement product.  RELAPSE OR DIFFICULT SITUATIONS Most relapses occur within the first 3 months after quitting. Do not be discouraged if you start smoking again. Remember, most people try several times before finally quitting. You may have symptoms of withdrawal because your body is used to nicotine. You may crave cigarettes, be irritable, feel very hungry, cough often, get headaches, or have difficulty concentrating. The withdrawal symptoms are only temporary. They are strongest  when you first quit, but they will go away within 10-14 days. To reduce the chances of relapse, try to:  Avoid drinking alcohol. Drinking lowers your chances of successfully quitting.  Reduce the amount of caffeine you consume. Once you quit smoking, the amount of caffeine in your body increases and can give you symptoms, such as a rapid heartbeat, sweating, and anxiety.  Avoid smokers because they can make you want to smoke.  Do not let weight gain distract you. Many smokers will gain weight when they quit, usually less than 10 pounds. Eat a healthy diet and stay active. You can always lose the weight gained after you quit.  Find ways to improve your mood other than smoking. FOR MORE INFORMATION  www.smokefree.gov  Document Released: 03/07/2001 Document Revised: 07/28/2013 Document Reviewed: 06/22/2011 Exit[employer] Patient Information 2015 [employer]. This information is not intended to replace advice given to you by your health care provider. Make sure you discuss any questions you have with your health care provider.  

## 2013-12-03 ENCOUNTER — Ambulatory Visit (INDEPENDENT_AMBULATORY_CARE_PROVIDER_SITE_OTHER): Payer: BC Managed Care – PPO | Admitting: Family Medicine

## 2013-12-03 ENCOUNTER — Encounter: Payer: Self-pay | Admitting: Family Medicine

## 2013-12-03 VITALS — BP 132/78 | HR 77 | Temp 97.9°F | Wt 182.0 lb

## 2013-12-03 DIAGNOSIS — T24202A Burn of second degree of unspecified site of left lower limb, except ankle and foot, initial encounter: Secondary | ICD-10-CM

## 2013-12-03 DIAGNOSIS — T24209A Burn of second degree of unspecified site of unspecified lower limb, except ankle and foot, initial encounter: Secondary | ICD-10-CM

## 2013-12-03 MED ORDER — CEPHALEXIN 500 MG PO CAPS: 500.0000 mg | ORAL_CAPSULE | Freq: Three times a day (TID) | ORAL | Status: DC
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
## 2013-12-03 NOTE — Progress Notes (Signed)
Pre visit review using our clinic review tool, if applicable. No additional management support is needed unless otherwise documented below in the visit note. 

## 2013-12-03 NOTE — Patient Instructions (Signed)
Burn Care Your skin is a natural barrier to infection. It is the largest organ of your body. Burns damage this natural protection. To help prevent infection, it is very important to follow your caregiver's instructions in the care of your burn. Burns are classified as:  First degree. There is only redness of the skin (erythema). No scarring is expected.  Second degree. There is blistering of the skin. Scarring may occur with deeper burns.  Third degree. All layers of the skin are injured, and scarring is expected. HOME CARE INSTRUCTIONS   Wash your hands well before changing your bandage.  Change your bandage as often as directed by your caregiver.  Remove the old bandage. If the bandage sticks, you may soak it off with cool, clean water.  Cleanse the burn thoroughly but gently with mild soap and water.  Pat the area dry with a clean, dry cloth.  Apply a thin layer of antibacterial cream to the burn.  Apply a clean bandage as instructed by your caregiver.  Keep the bandage as clean and dry as possible.  Elevate the affected area for the first 24 hours, then as instructed by your caregiver.  Only take over-the-counter or prescription medicines for pain, discomfort, or fever as directed by your caregiver. SEEK IMMEDIATE MEDICAL CARE IF:   You develop excessive pain.  You develop redness, tenderness, swelling, or red streaks near the burn.  The burned area develops yellowish-white fluid (pus) or a bad smell.  You have a fever. MAKE SURE YOU:   Understand these instructions.  Will watch your condition.  Will get help right away if you are not doing well or get worse. Document Released: 03/13/2005 Document Revised: 06/05/2011 Document Reviewed: 08/03/2010 ExitCare Patient Information 2015 [employer]. This information is not intended to replace advice given to you by your health care provider. Make sure you discuss any questions you have with your health care  provider.  

## 2013-12-08 ENCOUNTER — Encounter: Payer: Self-pay | Admitting: Family Medicine

## 2013-12-09 ENCOUNTER — Ambulatory Visit (INDEPENDENT_AMBULATORY_CARE_PROVIDER_SITE_OTHER): Payer: BC Managed Care – PPO | Admitting: Family Medicine

## 2013-12-09 ENCOUNTER — Encounter: Payer: Self-pay | Admitting: Family Medicine

## 2013-12-09 VITALS — BP 128/76 | HR 62 | Wt 180.0 lb

## 2013-12-09 DIAGNOSIS — Z5189 Encounter for other specified aftercare: Secondary | ICD-10-CM

## 2013-12-09 DIAGNOSIS — T24202D Burn of second degree of unspecified site of left lower limb, except ankle and foot, subsequent encounter: Secondary | ICD-10-CM

## 2013-12-09 DIAGNOSIS — T24209A Burn of second degree of unspecified site of unspecified lower limb, except ankle and foot, initial encounter: Secondary | ICD-10-CM

## 2013-12-09 NOTE — Patient Instructions (Signed)
Follow up for any increased redness or drainage Suspect this will be mostly healed in one week.

## 2013-12-09 NOTE — Progress Notes (Signed)
Pre visit review using our clinic review tool, if applicable. No additional management support is needed unless otherwise documented below in the visit note. 
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
## 2014-01-10 ENCOUNTER — Other Ambulatory Visit: Payer: Self-pay | Admitting: Family Medicine

## 2014-09-21 ENCOUNTER — Other Ambulatory Visit: Payer: Self-pay

## 2014-11-09 ENCOUNTER — Other Ambulatory Visit (INDEPENDENT_AMBULATORY_CARE_PROVIDER_SITE_OTHER): Payer: Medicare Other

## 2014-11-09 DIAGNOSIS — Z0000 Encounter for general adult medical examination without abnormal findings: Secondary | ICD-10-CM

## 2014-11-09 DIAGNOSIS — E785 Hyperlipidemia, unspecified: Secondary | ICD-10-CM

## 2014-11-09 LAB — BASIC METABOLIC PANEL
BUN: 12 mg/dL (ref 6–23)
CALCIUM: 9.3 mg/dL (ref 8.4–10.5)
CO2: 29 mEq/L (ref 19–32)
Chloride: 108 mEq/L (ref 96–112)
Creatinine, Ser: 0.94 mg/dL (ref 0.40–1.50)
GFR: 85.49 mL/min (ref >60.00)
GLUCOSE: 111 mg/dL — AB (ref 70–99)
Potassium: 4.8 mEq/L (ref 3.5–5.1)
SODIUM: 146 meq/L — AB (ref 135–145)

## 2014-11-09 LAB — HEPATIC FUNCTION PANEL
ALK PHOS: 60 U/L (ref 39–117)
ALT: 13 U/L (ref 0–53)
AST: 13 U/L (ref 0–37)
Albumin: 4 g/dL (ref 3.5–5.2)
BILIRUBIN DIRECT: 0.1 mg/dL (ref 0.0–0.3)
BILIRUBIN TOTAL: 0.6 mg/dL (ref 0.2–1.2)
Total Protein: 6.9 g/dL (ref 6.0–8.3)

## 2014-11-09 LAB — LIPID PANEL
CHOL/HDL RATIO: 4
Cholesterol: 147 mg/dL (ref 0–200)
HDL: 40.2 mg/dL (ref >39.00)
LDL CALC: 87 mg/dL (ref 0–99)
NONHDL: 107.19
Triglycerides: 102 mg/dL (ref 0.0–149.0)
VLDL: 20.4 mg/dL (ref 0.0–40.0)

## 2014-11-09 LAB — CBC WITH DIFFERENTIAL/PLATELET
BASOS ABS: 0.1 10*3/uL (ref 0.0–0.1)
Basophils Relative: 0.5 % (ref 0.0–3.0)
EOS ABS: 0.2 10*3/uL (ref 0.0–0.7)
Eosinophils Relative: 1.7 % (ref 0.0–5.0)
HCT: 42.4 % (ref 39.0–52.0)
Hemoglobin: 14 g/dL (ref 13.0–17.0)
LYMPHS ABS: 2.6 10*3/uL (ref 0.7–4.0)
Lymphocytes Relative: 23.8 % (ref 12.0–46.0)
MCHC: 33 g/dL (ref 30.0–36.0)
MCV: 93 fl (ref 78.0–100.0)
MONOS PCT: 8.3 % (ref 3.0–12.0)
Monocytes Absolute: 0.9 10*3/uL (ref 0.1–1.0)
NEUTROS PCT: 65.7 % (ref 43.0–77.0)
Neutro Abs: 7.2 10*3/uL (ref 1.4–7.7)
Platelets: 219 10*3/uL (ref 150.0–400.0)
RBC: 4.56 Mil/uL (ref 4.22–5.81)
RDW: 14.1 % (ref 11.5–15.5)
WBC: 11 10*3/uL — ABNORMAL HIGH (ref 4.0–10.5)

## 2014-11-09 LAB — PSA: PSA: 0.74 ng/mL (ref 0.10–4.00)

## 2014-11-09 LAB — TSH: TSH: 1.97 u[IU]/mL (ref 0.35–4.50)

## 2014-11-16 ENCOUNTER — Encounter: Payer: Self-pay | Admitting: Family Medicine

## 2014-11-16 ENCOUNTER — Ambulatory Visit (INDEPENDENT_AMBULATORY_CARE_PROVIDER_SITE_OTHER): Payer: Medicare Other | Admitting: Family Medicine

## 2014-11-16 VITALS — BP 126/80 | HR 60 | Temp 97.6°F | Ht 64.0 in | Wt 180.0 lb

## 2014-11-16 DIAGNOSIS — Z23 Encounter for immunization: Secondary | ICD-10-CM | POA: Diagnosis not present

## 2014-11-16 DIAGNOSIS — Z0000 Encounter for general adult medical examination without abnormal findings: Secondary | ICD-10-CM

## 2014-11-16 NOTE — Addendum Note (Signed)
Addended by: Marcina Millard on: 11/16/2014 09:45 AM   Modules accepted: Orders

## 2014-11-16 NOTE — Progress Notes (Signed)
   Subjective:    Patient ID: Martin Navarro, male    DOB: 01/30/1951, 64 y.o.   MRN: [id number]  HPI Patient here for complete physical.   His chronic  problems include GERD, hyperlipidemia, nicotine use, erectile dysfunction. He is currently taking Crestor and aspirin. No longer takes Cialis. Did not see much benefit from this. He takes omeprazole for GERD and GERD symptoms are well controlled. Tetanus up-to-date. He has had previous shingles vaccine. Needs Prevnar 13. Being scheduled for repeat colonoscopy.   He has scaled back his smoking tremendously and currently uses vapor smokes with minimal nicotine  Past Medical History  Diagnosis Date  . Asthma   . Emphysema of lung   . Hyperlipidemia   . Erectile dysfunction    No past surgical history on file.  reports that he has been smoking Cigarettes.  He has a 67.5 pack-year smoking history. He does not have any smokeless tobacco history on file. He reports that he does not drink alcohol. His drug history is not on file. family history includes Heart disease (age of onset: 78) in his father; Hypertension in his father. No Known Allergies    Review of Systems  Constitutional: Negative for fever, activity change, appetite change and fatigue.  HENT: Negative for congestion, ear pain and trouble swallowing.   Eyes: Negative for pain and visual disturbance.  Respiratory: Negative for cough, shortness of breath and wheezing.   Cardiovascular: Negative for chest pain and palpitations.  Gastrointestinal: Negative for nausea, vomiting, abdominal pain, diarrhea, constipation, blood in stool, abdominal distention and rectal pain.  Genitourinary: Negative for dysuria, hematuria and testicular pain.  Musculoskeletal: Negative for joint swelling and arthralgias.  Skin: Negative for rash.  Neurological: Negative for dizziness, syncope and headaches.  Hematological: Negative for adenopathy.  Psychiatric/Behavioral: Negative for confusion and  dysphoric mood.       Objective:   Physical Exam  Constitutional: He is oriented to person, place, and time. He appears well-developed and well-nourished. No distress.  HENT:  Head: Normocephalic and atraumatic.  Right Ear: External ear normal.  Left Ear: External ear normal.  Mouth/Throat: Oropharynx is clear and moist.  Eyes: Conjunctivae and EOM are normal. Pupils are equal, round, and reactive to light.  Neck: Normal range of motion. Neck supple. No thyromegaly present.  Cardiovascular: Normal rate, regular rhythm and normal heart sounds.   No murmur heard. Pulmonary/Chest: No respiratory distress. He has no wheezes. He has no rales.  Abdominal: Soft. Bowel sounds are normal. He exhibits no distension and no mass. There is no tenderness. There is no rebound and no guarding.  Musculoskeletal: He exhibits no edema.  Lymphadenopathy:    He has no cervical adenopathy.  Neurological: He is alert and oriented to person, place, and time. He displays normal reflexes. No cranial nerve deficit.  Skin: No rash noted.  Psychiatric: He has a normal mood and affect.          Assessment & Plan:   Complete physical. Smoking cessation discussed. Prevnar 13 given. Pneumovax in 1 year. Labs reviewed. Mild hyperglycemia. Scale back soft drink intake as well as white starches and overall sugar. Recheck fasting glucose in 6 months and consider A1c then

## 2014-11-16 NOTE — Patient Instructions (Signed)

## 2014-11-16 NOTE — Progress Notes (Signed)
Pre visit review using our clinic review tool, if applicable. No additional management support is needed unless otherwise documented below in the visit note. 

## 2014-12-22 ENCOUNTER — Encounter: Payer: Self-pay | Admitting: Family Medicine

## 2015-01-13 ENCOUNTER — Other Ambulatory Visit: Payer: Self-pay | Admitting: Family Medicine

## 2015-01-19 ENCOUNTER — Encounter: Payer: Self-pay | Admitting: Family Medicine

## 2015-02-08 ENCOUNTER — Other Ambulatory Visit: Payer: Self-pay | Admitting: Family Medicine

## 2015-10-03 ENCOUNTER — Other Ambulatory Visit: Payer: Self-pay | Admitting: Family Medicine

## 2015-11-05 ENCOUNTER — Other Ambulatory Visit: Payer: Self-pay | Admitting: Family Medicine

## 2015-11-09 ENCOUNTER — Other Ambulatory Visit (INDEPENDENT_AMBULATORY_CARE_PROVIDER_SITE_OTHER): Payer: Medicare Other

## 2015-11-09 DIAGNOSIS — Z0000 Encounter for general adult medical examination without abnormal findings: Secondary | ICD-10-CM | POA: Diagnosis not present

## 2015-11-09 LAB — BASIC METABOLIC PANEL
BUN: 12 mg/dL (ref 6–23)
CALCIUM: 9.2 mg/dL (ref 8.4–10.5)
CO2: 28 mEq/L (ref 19–32)
Chloride: 105 mEq/L (ref 96–112)
Creatinine, Ser: 0.87 mg/dL (ref 0.40–1.50)
GFR: 93.19 mL/min (ref >60.00)
Glucose, Bld: 93 mg/dL (ref 70–99)
Potassium: 3.9 mEq/L (ref 3.5–5.1)
SODIUM: 140 meq/L (ref 135–145)

## 2015-11-09 LAB — CBC WITH DIFFERENTIAL/PLATELET
BASOS ABS: 0.1 10*3/uL (ref 0.0–0.1)
Basophils Relative: 0.6 % (ref 0.0–3.0)
EOS ABS: 0.3 10*3/uL (ref 0.0–0.7)
Eosinophils Relative: 3.2 % (ref 0.0–5.0)
HEMATOCRIT: 41 % (ref 39.0–52.0)
Hemoglobin: 13.9 g/dL (ref 13.0–17.0)
LYMPHS PCT: 26.9 % (ref 12.0–46.0)
Lymphs Abs: 2.7 10*3/uL (ref 0.7–4.0)
MCHC: 33.8 g/dL (ref 30.0–36.0)
MCV: 91.1 fl (ref 78.0–100.0)
MONOS PCT: 10.5 % (ref 3.0–12.0)
Monocytes Absolute: 1.1 10*3/uL — ABNORMAL HIGH (ref 0.1–1.0)
Neutro Abs: 6 10*3/uL (ref 1.4–7.7)
Neutrophils Relative %: 58.8 % (ref 43.0–77.0)
PLATELETS: 217 10*3/uL (ref 150.0–400.0)
RBC: 4.5 Mil/uL (ref 4.22–5.81)
RDW: 14 % (ref 11.5–15.5)
WBC: 10.2 10*3/uL (ref 4.0–10.5)

## 2015-11-09 LAB — LIPID PANEL
CHOL/HDL RATIO: 3
CHOLESTEROL: 137 mg/dL (ref 0–200)
HDL: 40.1 mg/dL (ref >39.00)
LDL Cholesterol: 76 mg/dL (ref 0–99)
NonHDL: 97.26
TRIGLYCERIDES: 108 mg/dL (ref 0.0–149.0)
VLDL: 21.6 mg/dL (ref 0.0–40.0)

## 2015-11-09 LAB — HEPATIC FUNCTION PANEL
ALBUMIN: 3.9 g/dL (ref 3.5–5.2)
ALT: 11 U/L (ref 0–53)
AST: 12 U/L (ref 0–37)
Alkaline Phosphatase: 55 U/L (ref 39–117)
BILIRUBIN DIRECT: 0.1 mg/dL (ref 0.0–0.3)
TOTAL PROTEIN: 6.4 g/dL (ref 6.0–8.3)
Total Bilirubin: 0.7 mg/dL (ref 0.2–1.2)

## 2015-11-09 LAB — HEMOGLOBIN A1C: HEMOGLOBIN A1C: 5.9 % (ref 4.6–6.5)

## 2015-11-09 LAB — PSA: PSA: 0.72 ng/mL (ref 0.10–4.00)

## 2015-11-09 LAB — TSH: TSH: 2.14 u[IU]/mL (ref 0.35–4.50)

## 2015-11-17 ENCOUNTER — Ambulatory Visit (INDEPENDENT_AMBULATORY_CARE_PROVIDER_SITE_OTHER): Payer: Medicare Other | Admitting: Family Medicine

## 2015-11-17 ENCOUNTER — Encounter: Payer: Medicare Other | Admitting: Family Medicine

## 2015-11-17 ENCOUNTER — Encounter: Payer: Self-pay | Admitting: Family Medicine

## 2015-11-17 VITALS — BP 140/80 | HR 67 | Temp 97.4°F | Ht 63.0 in | Wt 178.8 lb

## 2015-11-17 DIAGNOSIS — Z0000 Encounter for general adult medical examination without abnormal findings: Secondary | ICD-10-CM

## 2015-11-17 DIAGNOSIS — Z23 Encounter for immunization: Secondary | ICD-10-CM | POA: Diagnosis not present

## 2015-11-17 NOTE — Patient Instructions (Signed)
Consider low dose CT lung cancer screen. Consider flu shot this Fall.

## 2015-11-17 NOTE — Progress Notes (Signed)
Pre visit review using our clinic review tool, if applicable. No additional management support is needed unless otherwise documented below in the visit note. 

## 2015-11-17 NOTE — Progress Notes (Signed)
Subjective:     Patient ID: Martin Navarro, male   DOB: 1950-12-28, 66 y.o.   MRN: [id number]  HPI Patient seen for physical exam. He transitioned recently to vapor cigarettes. His chronic problems include hyperlipidemia, GERD, erectile dysfunction. He's had mildly elevated blood sugars in the past. He had abdominal ultrasound 2014 with no evidence for abdominal aortic aneurysm. He had Prevnar 13 last year. He needs Pneumovax this year. Tetanus is up-to-date. Colonoscopy up-to-date.  Past Medical History:  Diagnosis Date  . Asthma   . Emphysema of lung ([place])   . Erectile dysfunction   . Hyperlipidemia    No past surgical history on file.  reports that he has been smoking Cigarettes.  He has a 67.50 pack-year smoking history. He does not have any smokeless tobacco history on file. He reports that he does not drink alcohol. His drug history is not on file. family history includes Heart disease (age of onset: 36) in his father; Hypertension in his father. No Known Allergies   Review of Systems  Constitutional: Negative for activity change, appetite change, fatigue and fever.  HENT: Negative for congestion, ear pain and trouble swallowing.   Eyes: Negative for pain and visual disturbance.  Respiratory: Negative for cough, shortness of breath and wheezing.   Cardiovascular: Negative for chest pain and palpitations.  Gastrointestinal: Negative for abdominal distention, abdominal pain, blood in stool, constipation, diarrhea, nausea, rectal pain and vomiting.  Endocrine: Negative for polydipsia and polyuria.  Genitourinary: Negative for dysuria, hematuria and testicular pain.  Musculoskeletal: Negative for arthralgias and joint swelling.  Skin: Negative for rash.  Neurological: Negative for dizziness, syncope and headaches.  Hematological: Negative for adenopathy.  Psychiatric/Behavioral: Negative for confusion and dysphoric mood.       Objective:   Physical Exam  Constitutional: He is  oriented to person, place, and time. He appears well-developed and well-nourished. No distress.  HENT:  Head: Normocephalic and atraumatic.  Right Ear: External ear normal.  Left Ear: External ear normal.  Mouth/Throat: Oropharynx is clear and moist.  Eyes: Conjunctivae and EOM are normal. Pupils are equal, round, and reactive to light.  Neck: Normal range of motion. Neck supple. No thyromegaly present.  Cardiovascular: Normal rate, regular rhythm and normal heart sounds.   No murmur heard. Pulmonary/Chest: No respiratory distress. He has no wheezes. He has no rales.  Abdominal: Soft. Bowel sounds are normal. He exhibits no distension and no mass. There is no tenderness. There is no rebound and no guarding.  Musculoskeletal: He exhibits no edema.  Lymphadenopathy:    He has no cervical adenopathy.  Neurological: He is alert and oriented to person, place, and time. He displays normal reflexes. No cranial nerve deficit.  Skin: No rash noted.  Psychiatric: He has a normal mood and affect.       Assessment:     Physical exam. Patient needs Pneumovax. He declines flu vaccination. Other immunizations up-to-date. Ongoing nicotine use in the form of vapor cigarettes    Plan:     -We discussed importance of nicotine cessation -Recommend flu vaccination but he declines -Pneumovax given -Discussed consideration for low-dose CT lung cancer screening and he will consider. He wishes to discuss with his daughter before deciding -Labs reviewed with no major concerns  [name] MD [facility]

## 2015-12-10 ENCOUNTER — Other Ambulatory Visit: Payer: Self-pay | Admitting: Family Medicine

## 2015-12-10 NOTE — Telephone Encounter (Signed)
Rx refill sent to pharmacy. 

## 2016-06-08 DIAGNOSIS — Z85828 Personal history of other malignant neoplasm of skin: Secondary | ICD-10-CM | POA: Diagnosis not present

## 2016-06-08 DIAGNOSIS — L570 Actinic keratosis: Secondary | ICD-10-CM | POA: Diagnosis not present

## 2016-06-08 DIAGNOSIS — L821 Other seborrheic keratosis: Secondary | ICD-10-CM | POA: Diagnosis not present

## 2016-06-08 DIAGNOSIS — D1801 Hemangioma of skin and subcutaneous tissue: Secondary | ICD-10-CM | POA: Diagnosis not present

## 2016-06-08 DIAGNOSIS — L814 Other melanin hyperpigmentation: Secondary | ICD-10-CM | POA: Diagnosis not present

## 2016-06-28 ENCOUNTER — Other Ambulatory Visit: Payer: Self-pay | Admitting: Family Medicine

## 2016-07-06 DIAGNOSIS — Z8249 Family history of ischemic heart disease and other diseases of the circulatory system: Secondary | ICD-10-CM | POA: Diagnosis not present

## 2016-07-06 DIAGNOSIS — R0600 Dyspnea, unspecified: Secondary | ICD-10-CM | POA: Diagnosis not present

## 2016-07-06 DIAGNOSIS — R079 Chest pain, unspecified: Secondary | ICD-10-CM | POA: Diagnosis not present

## 2016-07-14 DIAGNOSIS — R079 Chest pain, unspecified: Secondary | ICD-10-CM | POA: Diagnosis not present

## 2016-08-29 DIAGNOSIS — L089 Local infection of the skin and subcutaneous tissue, unspecified: Secondary | ICD-10-CM | POA: Diagnosis not present

## 2016-08-29 DIAGNOSIS — T148XXA Other injury of unspecified body region, initial encounter: Secondary | ICD-10-CM | POA: Diagnosis not present

## 2016-09-20 ENCOUNTER — Emergency Department (HOSPITAL_COMMUNITY)
Admission: EM | Admit: 2016-09-20 | Discharge: 2016-09-20 | Discharge status: Against Medical Advice | Payer: Medicare Other | Source: Home / Self Care

## 2016-09-20 ENCOUNTER — Encounter (HOSPITAL_COMMUNITY): Payer: Self-pay | Admitting: Emergency Medicine

## 2016-09-20 DIAGNOSIS — E876 Hypokalemia: Secondary | ICD-10-CM | POA: Diagnosis not present

## 2016-09-20 DIAGNOSIS — K5900 Constipation, unspecified: Secondary | ICD-10-CM | POA: Diagnosis not present

## 2016-09-20 DIAGNOSIS — E785 Hyperlipidemia, unspecified: Secondary | ICD-10-CM | POA: Diagnosis not present

## 2016-09-20 DIAGNOSIS — K219 Gastro-esophageal reflux disease without esophagitis: Secondary | ICD-10-CM | POA: Diagnosis not present

## 2016-09-20 DIAGNOSIS — R1030 Lower abdominal pain, unspecified: Secondary | ICD-10-CM

## 2016-09-20 DIAGNOSIS — Z5321 Procedure and treatment not carried out due to patient leaving prior to being seen by health care provider: Secondary | ICD-10-CM

## 2016-09-20 DIAGNOSIS — N410 Acute prostatitis: Secondary | ICD-10-CM | POA: Diagnosis not present

## 2016-09-20 DIAGNOSIS — Z79899 Other long term (current) drug therapy: Secondary | ICD-10-CM | POA: Diagnosis not present

## 2016-09-20 DIAGNOSIS — R1084 Generalized abdominal pain: Secondary | ICD-10-CM | POA: Diagnosis not present

## 2016-09-20 DIAGNOSIS — R509 Fever, unspecified: Secondary | ICD-10-CM | POA: Diagnosis not present

## 2016-09-20 DIAGNOSIS — R300 Dysuria: Secondary | ICD-10-CM | POA: Diagnosis not present

## 2016-09-20 DIAGNOSIS — A419 Sepsis, unspecified organism: Secondary | ICD-10-CM | POA: Diagnosis not present

## 2016-09-20 DIAGNOSIS — R110 Nausea: Secondary | ICD-10-CM | POA: Diagnosis not present

## 2016-09-20 DIAGNOSIS — E860 Dehydration: Secondary | ICD-10-CM | POA: Diagnosis not present

## 2016-09-20 DIAGNOSIS — R109 Unspecified abdominal pain: Secondary | ICD-10-CM | POA: Diagnosis not present

## 2016-09-20 DIAGNOSIS — N179 Acute kidney failure, unspecified: Secondary | ICD-10-CM | POA: Diagnosis not present

## 2016-09-20 DIAGNOSIS — Z7982 Long term (current) use of aspirin: Secondary | ICD-10-CM | POA: Diagnosis not present

## 2016-09-20 LAB — PROTIME-INR
INR: 1.18
PROTHROMBIN TIME: 15 s (ref 11.4–15.2)

## 2016-09-20 LAB — CBC WITH DIFFERENTIAL/PLATELET
BASOS PCT: 0 %
Basophils Absolute: 0 10*3/uL (ref 0.0–0.1)
EOS ABS: 0 10*3/uL (ref 0.0–0.7)
EOS PCT: 0 %
HCT: 45.4 % (ref 39.0–52.0)
HEMOGLOBIN: 15.3 g/dL (ref 13.0–17.0)
Lymphocytes Relative: 5 %
Lymphs Abs: 1.6 10*3/uL (ref 0.7–4.0)
MCH: 31.4 pg (ref 26.0–34.0)
MCHC: 33.7 g/dL (ref 30.0–36.0)
MCV: 93 fL (ref 78.0–100.0)
MONO ABS: 3.2 10*3/uL — AB (ref 0.1–1.0)
Monocytes Relative: 10 %
NEUTROS ABS: 27.2 10*3/uL — AB (ref 1.7–7.7)
NEUTROS PCT: 85 %
PLATELETS: 209 10*3/uL (ref 150–400)
RBC: 4.88 MIL/uL (ref 4.22–5.81)
RDW: 13.7 % (ref 11.5–15.5)
WBC: 32 10*3/uL — ABNORMAL HIGH (ref 4.0–10.5)

## 2016-09-20 LAB — URINALYSIS, ROUTINE W REFLEX MICROSCOPIC
Bilirubin Urine: NEGATIVE
GLUCOSE, UA: 50 mg/dL — AB
KETONES UR: 5 mg/dL — AB
NITRITE: NEGATIVE
PROTEIN: 100 mg/dL — AB
SQUAMOUS EPITHELIAL / LPF: NONE SEEN
Specific Gravity, Urine: 1.03 (ref 1.005–1.030)
pH: 5 (ref 5.0–8.0)

## 2016-09-20 LAB — I-STAT CG4 LACTIC ACID, ED: Lactic Acid, Venous: 1.8 mmol/L (ref 0.5–1.9)

## 2016-09-20 LAB — COMPREHENSIVE METABOLIC PANEL
ALBUMIN: 3.6 g/dL (ref 3.5–5.0)
ALK PHOS: 65 U/L (ref 38–126)
ALT: 13 U/L — AB (ref 17–63)
AST: 18 U/L (ref 15–41)
Anion gap: 12 (ref 5–15)
BILIRUBIN TOTAL: 1.5 mg/dL — AB (ref 0.3–1.2)
BUN: 12 mg/dL (ref 6–20)
CALCIUM: 9.3 mg/dL (ref 8.9–10.3)
CO2: 20 mmol/L — AB (ref 22–32)
CREATININE: 1.2 mg/dL (ref 0.61–1.24)
Chloride: 102 mmol/L (ref 101–111)
GFR calc Af Amer: >60 mL/min (ref >60)
GFR calc non Af Amer: >60 mL/min (ref >60)
GLUCOSE: 144 mg/dL — AB (ref 65–99)
Potassium: 3.8 mmol/L (ref 3.5–5.1)
SODIUM: 134 mmol/L — AB (ref 135–145)
TOTAL PROTEIN: 7.3 g/dL (ref 6.5–8.1)

## 2016-09-20 NOTE — ED Triage Notes (Signed)
Pt has lower abd pain that started yesterday with burning while urinating. Pt denies any n/v/d.

## 2016-09-20 NOTE — ED Notes (Signed)
Pt up to desk saying that they are unable to wait for pt to see MD.

## 2016-09-21 ENCOUNTER — Inpatient Hospital Stay (HOSPITAL_COMMUNITY)
Admission: EM | Admit: 2016-09-21 | Discharge: 2016-09-23 | DRG: 872 | Disposition: A | Discharge status: Home / Self Care | Payer: Medicare Other | Attending: Internal Medicine | Admitting: Internal Medicine

## 2016-09-21 ENCOUNTER — Encounter (HOSPITAL_COMMUNITY): Payer: Self-pay

## 2016-09-21 ENCOUNTER — Encounter: Payer: Self-pay | Admitting: Family Medicine

## 2016-09-21 ENCOUNTER — Ambulatory Visit (INDEPENDENT_AMBULATORY_CARE_PROVIDER_SITE_OTHER): Payer: Medicare Other | Admitting: Family Medicine

## 2016-09-21 ENCOUNTER — Emergency Department (HOSPITAL_COMMUNITY): Payer: Medicare Other

## 2016-09-21 VITALS — BP 120/70 | HR 108 | Temp 100.4°F | Ht 63.0 in

## 2016-09-21 DIAGNOSIS — R509 Fever, unspecified: Secondary | ICD-10-CM | POA: Diagnosis not present

## 2016-09-21 DIAGNOSIS — A419 Sepsis, unspecified organism: Secondary | ICD-10-CM

## 2016-09-21 DIAGNOSIS — E876 Hypokalemia: Secondary | ICD-10-CM | POA: Diagnosis present

## 2016-09-21 DIAGNOSIS — R300 Dysuria: Secondary | ICD-10-CM | POA: Diagnosis present

## 2016-09-21 DIAGNOSIS — K219 Gastro-esophageal reflux disease without esophagitis: Secondary | ICD-10-CM | POA: Diagnosis present

## 2016-09-21 DIAGNOSIS — F17210 Nicotine dependence, cigarettes, uncomplicated: Secondary | ICD-10-CM | POA: Diagnosis present

## 2016-09-21 DIAGNOSIS — K5900 Constipation, unspecified: Secondary | ICD-10-CM | POA: Diagnosis present

## 2016-09-21 DIAGNOSIS — R1084 Generalized abdominal pain: Secondary | ICD-10-CM

## 2016-09-21 DIAGNOSIS — E860 Dehydration: Secondary | ICD-10-CM | POA: Diagnosis not present

## 2016-09-21 DIAGNOSIS — R110 Nausea: Secondary | ICD-10-CM

## 2016-09-21 DIAGNOSIS — N179 Acute kidney failure, unspecified: Secondary | ICD-10-CM | POA: Diagnosis present

## 2016-09-21 DIAGNOSIS — E785 Hyperlipidemia, unspecified: Secondary | ICD-10-CM | POA: Diagnosis present

## 2016-09-21 DIAGNOSIS — Z7982 Long term (current) use of aspirin: Secondary | ICD-10-CM

## 2016-09-21 DIAGNOSIS — N410 Acute prostatitis: Secondary | ICD-10-CM | POA: Diagnosis present

## 2016-09-21 DIAGNOSIS — Z79899 Other long term (current) drug therapy: Secondary | ICD-10-CM

## 2016-09-21 HISTORY — DX: Sepsis, unspecified organism: A41.9

## 2016-09-21 HISTORY — DX: Acute prostatitis: N41.0

## 2016-09-21 LAB — CBC
HCT: 44.4 % (ref 39.0–52.0)
HEMOGLOBIN: 14.9 g/dL (ref 13.0–17.0)
MCH: 31.2 pg (ref 26.0–34.0)
MCHC: 33.6 g/dL (ref 30.0–36.0)
MCV: 92.9 fL (ref 78.0–100.0)
PLATELETS: 159 10*3/uL (ref 150–400)
RBC: 4.78 MIL/uL (ref 4.22–5.81)
RDW: 13.8 % (ref 11.5–15.5)
WBC: 22.7 10*3/uL — AB (ref 4.0–10.5)

## 2016-09-21 LAB — COMPREHENSIVE METABOLIC PANEL
ALK PHOS: 83 U/L (ref 38–126)
ALT: 14 U/L — AB (ref 17–63)
ANION GAP: 9 (ref 5–15)
AST: 16 U/L (ref 15–41)
Albumin: 3.4 g/dL — ABNORMAL LOW (ref 3.5–5.0)
BUN: 18 mg/dL (ref 6–20)
CALCIUM: 9.1 mg/dL (ref 8.9–10.3)
CO2: 24 mmol/L (ref 22–32)
CREATININE: 1.27 mg/dL — AB (ref 0.61–1.24)
Chloride: 97 mmol/L — ABNORMAL LOW (ref 101–111)
GFR, EST NON AFRICAN AMERICAN: 57 mL/min — AB (ref >60)
Glucose, Bld: 138 mg/dL — ABNORMAL HIGH (ref 65–99)
Potassium: 4.2 mmol/L (ref 3.5–5.1)
Sodium: 130 mmol/L — ABNORMAL LOW (ref 135–145)
TOTAL PROTEIN: 7.2 g/dL (ref 6.5–8.1)
Total Bilirubin: 1.4 mg/dL — ABNORMAL HIGH (ref 0.3–1.2)

## 2016-09-21 LAB — POC URINALSYSI DIPSTICK (AUTOMATED)
BILIRUBIN UA: NEGATIVE
GLUCOSE UA: NEGATIVE
Leukocytes, UA: NEGATIVE
NITRITE UA: NEGATIVE
PH UA: 6 (ref 5.0–8.0)
UROBILINOGEN UA: 1 U/dL

## 2016-09-21 LAB — URINALYSIS, ROUTINE W REFLEX MICROSCOPIC
BILIRUBIN URINE: NEGATIVE
GLUCOSE, UA: NEGATIVE mg/dL
KETONES UR: 20 mg/dL — AB
LEUKOCYTES UA: NEGATIVE
NITRITE: NEGATIVE
PH: 5 (ref 5.0–8.0)
Protein, ur: 100 mg/dL — AB
Specific Gravity, Urine: 1.031 — ABNORMAL HIGH (ref 1.005–1.030)

## 2016-09-21 LAB — LIPASE, BLOOD: LIPASE: 18 U/L (ref 11–51)

## 2016-09-21 LAB — I-STAT CG4 LACTIC ACID, ED
Lactic Acid, Venous: 1.51 mmol/L (ref 0.5–1.9)
Lactic Acid, Venous: 1.77 mmol/L (ref 0.5–1.9)

## 2016-09-21 MED ORDER — ONDANSETRON HCL 4 MG PO TABS: 4.0000 mg | ORAL_TABLET | Freq: Four times a day (QID) | ORAL | Status: DC | PRN

## 2016-09-21 MED ORDER — VITAMIN E 180 MG (400 UNIT) PO CAPS
1200.0000 [IU] | ORAL_CAPSULE | Freq: Every day | ORAL | Status: DC
  Filled 2016-09-21 (×2): qty 3

## 2016-09-21 MED ORDER — ENOXAPARIN SODIUM 40 MG/0.4ML ~~LOC~~ SOLN
40.0000 mg | SUBCUTANEOUS | Status: DC
  Administered 2016-09-22: 40 mg via SUBCUTANEOUS
  Filled 2016-09-21 (×2): qty 0.4

## 2016-09-21 MED ORDER — SODIUM CHLORIDE 0.9 % IV BOLUS (SEPSIS)
1000.0000 mL | Freq: Once | INTRAVENOUS | Status: AC
  Administered 2016-09-21: 1000 mL via INTRAVENOUS

## 2016-09-21 MED ORDER — ACETAMINOPHEN 650 MG RE SUPP
650.0000 mg | Freq: Four times a day (QID) | RECTAL | Status: DC | PRN
Start: 2016-09-21 — End: 2016-09-23

## 2016-09-21 MED ORDER — ACETAMINOPHEN 325 MG PO TABS
650.0000 mg | ORAL_TABLET | Freq: Four times a day (QID) | ORAL | Status: DC | PRN
  Administered 2016-09-22 (×2): 650 mg via ORAL
  Filled 2016-09-21 (×3): qty 2

## 2016-09-21 MED ORDER — ROSUVASTATIN CALCIUM 10 MG PO TABS
10.0000 mg | ORAL_TABLET | Freq: Every day | ORAL | Status: DC
  Administered 2016-09-22 – 2016-09-23 (×2): 10 mg via ORAL
  Filled 2016-09-21 (×2): qty 1

## 2016-09-21 MED ORDER — HYDROCODONE-ACETAMINOPHEN 5-325 MG PO TABS: 1.0000 | ORAL_TABLET | ORAL | Status: DC | PRN

## 2016-09-21 MED ORDER — ONDANSETRON HCL 4 MG/2ML IJ SOLN: 4.0000 mg | Freq: Four times a day (QID) | INTRAMUSCULAR | Status: DC | PRN

## 2016-09-21 MED ORDER — LEVOFLOXACIN IN D5W 750 MG/150ML IV SOLN
750.0000 mg | INTRAVENOUS | Status: DC
  Administered 2016-09-21 – 2016-09-22 (×2): 750 mg via INTRAVENOUS
  Filled 2016-09-21 (×2): qty 150

## 2016-09-21 MED ORDER — IOPAMIDOL (ISOVUE-300) INJECTION 61%
INTRAVENOUS | Status: AC
  Administered 2016-09-21: 100 mL
  Filled 2016-09-21: qty 100

## 2016-09-21 MED ORDER — PANTOPRAZOLE SODIUM 40 MG PO TBEC
80.0000 mg | DELAYED_RELEASE_TABLET | Freq: Every day | ORAL | Status: DC
  Administered 2016-09-22 – 2016-09-23 (×2): 80 mg via ORAL
  Filled 2016-09-21 (×2): qty 2

## 2016-09-21 MED ORDER — OMEGA-3-ACID ETHYL ESTERS 1 G PO CAPS
1000.0000 mg | ORAL_CAPSULE | Freq: Every day | ORAL | Status: DC
  Administered 2016-09-23: 1000 mg via ORAL
  Filled 2016-09-21 (×2): qty 1

## 2016-09-21 MED ORDER — SODIUM CHLORIDE 0.9 % IV SOLN
INTRAVENOUS | Status: DC
  Administered 2016-09-21 – 2016-09-23 (×3): via INTRAVENOUS

## 2016-09-21 MED ORDER — ASPIRIN 81 MG PO CHEW
81.0000 mg | CHEWABLE_TABLET | Freq: Every day | ORAL | Status: DC
  Administered 2016-09-22 – 2016-09-23 (×2): 81 mg via ORAL
  Filled 2016-09-21 (×2): qty 1

## 2016-09-21 MED ORDER — VITAMIN B-12 1000 MCG PO TABS
1000.0000 ug | ORAL_TABLET | Freq: Every day | ORAL | Status: DC
  Administered 2016-09-23: 1000 ug via ORAL
  Filled 2016-09-21 (×2): qty 1

## 2016-09-21 NOTE — ED Notes (Signed)
Pt complains of lower abd pain. Pt states he was here at the hospital yesterday but did not want to wait so he left. Pt states his pain got worse so he decided to come back.

## 2016-09-21 NOTE — Patient Instructions (Signed)
Go straight to the hospital for evaluation.

## 2016-09-21 NOTE — Progress Notes (Signed)
Pharmacy Antibiotic Note  Martin Navarro is a 66 y.o. male admitted on 09/21/2016 with prostatitis.  Pharmacy has been consulted for levofloxacin dosing.  Plan: Levofloxacin 750mg  IV q24h Follow c/s, clinical progression, renal function, LOT  Weight: 178 lb (80.7 kg)  Temp (24hrs), Avg:99.6 F (37.6 C), Min:98.8 F (37.1 C), Max:100.4 F (38 C)   Recent Labs Lab 09/20/16 1828 09/20/16 1846 09/21/16 1458 09/21/16 1518 09/21/16 2000  WBC 32.0*  --  22.7*  --   --   CREATININE 1.20  --  1.27*  --   --   LATICACIDVEN  --  1.80  --  1.51 1.77    Estimated Creatinine Clearance: 53 mL/min (A) (by C-G formula based on SCr of 1.27 mg/dL (H)).    No Known Allergies  Antimicrobials this admission: Levofloxacin 6/28 >>   Dose adjustments this admission: n/a  Microbiology results: 6/28 UCx:     Thank you for allowing pharmacy to be a part of this patient's care.  Zyia Kaneko D. [place], PharmD, [place] Clinical Pharmacist [phone] 09/21/2016 10:33 PM

## 2016-09-21 NOTE — ED Triage Notes (Signed)
Patient complains of sharp generalized abdominal pain x 2 days. Reports fever and constipation since Monday, no nausea, no vomiting. Sent from his doctor for further evaluation

## 2016-09-21 NOTE — H&P (Signed)
History and Physical    Martin Navarro KGM:[id number] DOB: [birth year] DOA: 09/21/2016  PCP: [name], MD  Patient coming from: Home  I have personally briefly reviewed patient's old medical records in [place]  Chief Complaint: Abd pain  HPI: Martin Navarro is a 66 y.o. male with medical history significant of Emphyzema.  Patient presented to the ED yesterday with abd pain but left before being seen due to long wait.  He returns to ED today.  Abd pain is in lower abd, associated dysuria, has had some constipation but no N/V/D.  Has had fevers (100.4 in ED).  Pain is dul.   ED Course: Work up / exam c/w acute bacterial prostatitis.  WBC 22k today but was 32k yesterday, decision made to admit patient for observation given that he meets SIRS criteria.  Also initial HR was 110s.  T 100.4 in ED.   Review of Systems: As per HPI otherwise 10 point review of systems negative.   Past Medical History:  Diagnosis Date  . Asthma   . Emphysema of lung ([place])   . Erectile dysfunction   . Hyperlipidemia     History reviewed. No pertinent surgical history.   reports that he has been smoking Cigarettes.  He has a 67.50 pack-year smoking history. He has never used smokeless tobacco. He reports that he does not drink alcohol. His drug history is not on file.  No Known Allergies  Family History  Problem Relation Age of Onset  . Hypertension Father   . Heart disease Father 59     Prior to Admission medications   Medication Sig Start Date End Date Taking? Authorizing Provider  aspirin 81 MG tablet Take 81 mg by mouth daily.     Yes [provider]  Omega-3 Fatty Acids (FISH OIL) 1200 MG CAPS Take 1,200 mg by mouth daily.   Yes [provider]  omeprazole (PRILOSEC) 40 MG capsule TAKE 1 CAPSULE BY MOUTH EVERY DAY 06/28/16  Yes Burchette, Alinda Sierras, MD  rosuvastatin (CRESTOR) 10 MG tablet TAKE 1 TABLET BY MOUTH EVERY DAY 06/28/16  Yes Burchette, Alinda Sierras, MD    vitamin B-12 (CYANOCOBALAMIN) 1000 MCG tablet Take 1,000-5,000 mcg by mouth daily.    Yes [provider]  vitamin E 400 UNIT capsule Take 1,200 Units by mouth daily.   Yes [provider]    Physical Exam: Vitals:   09/21/16 1450 09/21/16 2105  BP: 122/82 109/64  Pulse: (!) 112 87  Resp: 17 18  Temp: 98.8 F (37.1 C)   TempSrc: Oral   SpO2: 99% 99%    Constitutional: NAD, calm, comfortable Eyes: PERRL, lids and conjunctivae normal ENMT: Mucous membranes are moist. Posterior pharynx clear of any exudate or lesions.Normal dentition.  Neck: normal, supple, no masses, no thyromegaly Respiratory: clear to auscultation bilaterally, no wheezing, no crackles. Normal respiratory effort. No accessory muscle use.  Cardiovascular: Regular rate and rhythm, no murmurs / rubs / gallops. No extremity edema. 2+ pedal pulses. No carotid bruits.  Abdomen: no tenderness, no masses palpated. No hepatosplenomegaly. Bowel sounds positive.  Musculoskeletal: no clubbing / cyanosis. No joint deformity upper and lower extremities. Good ROM, no contractures. Normal muscle tone.  Skin: no rashes, lesions, ulcers. No induration Neurologic: CN 2-12 grossly intact. Sensation intact, DTR normal. Strength 5/5 in all 4.  Psychiatric: Normal judgment and insight. Alert and oriented x 3. Normal mood.    Labs on Admission: I have personally reviewed following labs  and imaging studies  CBC:  Recent Labs Lab 09/20/16 1828 09/21/16 1458  WBC 32.0* 22.7*  NEUTROABS 27.2*  --   HGB 15.3 14.9  HCT 45.4 44.4  MCV 93.0 92.9  PLT 209 017   Basic Metabolic Panel:  Recent Labs Lab 09/20/16 1828 09/21/16 1458  NA 134* 130*  K 3.8 4.2  CL 102 97*  CO2 20* 24  GLUCOSE 144* 138*  BUN 12 18  CREATININE 1.20 1.27*  CALCIUM 9.3 9.1   GFR: CrCl cannot be calculated (Unknown ideal weight.). Liver Function Tests:  Recent Labs Lab 09/20/16 1828 09/21/16 1458  AST 18 16  ALT 13* 14*   ALKPHOS 65 83  BILITOT 1.5* 1.4*  PROT 7.3 7.2  ALBUMIN 3.6 3.4*    Recent Labs Lab 09/21/16 1458  LIPASE 18   No results for input(s): AMMONIA in the last 168 hours. Coagulation Profile:  Recent Labs Lab 09/20/16 1828  INR 1.18   Cardiac Enzymes: No results for input(s): CKTOTAL, CKMB, CKMBINDEX, TROPONINI in the last 168 hours. BNP (last 3 results) No results for input(s): PROBNP in the last 8760 hours. HbA1C: No results for input(s): HGBA1C in the last 72 hours. CBG: No results for input(s): GLUCAP in the last 168 hours. Lipid Profile: No results for input(s): CHOL, HDL, LDLCALC, TRIG, CHOLHDL, LDLDIRECT in the last 72 hours. Thyroid Function Tests: No results for input(s): TSH, T4TOTAL, FREET4, T3FREE, THYROIDAB in the last 72 hours. Anemia Panel: No results for input(s): VITAMINB12, FOLATE, FERRITIN, TIBC, IRON, RETICCTPCT in the last 72 hours. Urine analysis:    Component Value Date/Time   COLORURINE AMBER (A) 09/21/2016 1506   APPEARANCEUR CLOUDY (A) 09/21/2016 1506   LABSPEC 1.031 (H) 09/21/2016 1506   PHURINE 5.0 09/21/2016 1506   GLUCOSEU NEGATIVE 09/21/2016 1506   HGBUR MODERATE (A) 09/21/2016 1506   BILIRUBINUR NEGATIVE 09/21/2016 1506   BILIRUBINUR negative 09/21/2016 1334   KETONESUR 20 (A) 09/21/2016 1506   PROTEINUR 100 (A) 09/21/2016 1506   UROBILINOGEN 1.0 09/21/2016 1334   UROBILINOGEN 0.2 06/07/2012 1635   NITRITE NEGATIVE 09/21/2016 1506   LEUKOCYTESUR NEGATIVE 09/21/2016 1506    Radiological Exams on Admission: Ct Abdomen Pelvis W Contrast  Result Date: 09/21/2016 CLINICAL DATA:  Abdominal pain and fever. EXAM: CT ABDOMEN AND PELVIS WITH CONTRAST TECHNIQUE: Multidetector CT imaging of the abdomen and pelvis was performed using the standard protocol following bolus administration of intravenous contrast. CONTRAST:  131mL ISOVUE-300 IOPAMIDOL (ISOVUE-300) INJECTION 61% COMPARISON:  None. FINDINGS: Lower chest:  Centrilobular emphysema.  Hepatobiliary: No focal abnormality within the liver parenchyma. There is no evidence for gallstones, gallbladder wall thickening, or pericholecystic fluid. No intrahepatic or extrahepatic biliary dilation. Pancreas: No focal mass lesion. No dilatation of the main duct. No intraparenchymal cyst. No peripancreatic edema. Spleen: No splenomegaly. No focal mass lesion. Adrenals/Urinary Tract: No adrenal nodule or mass. Right kidney unremarkable. 10 mm hypoattenuating lesion posterior cortex interpolar left kidney cannot be definitively characterized. No evidence for hydroureter. Bladder wall is ill-defined with subtle perivesicular edema/inflammation evident. Stomach/Bowel: Stomach is nondistended. No gastric wall thickening. No evidence of outlet obstruction. Duodenum is normally positioned as is the ligament of Treitz. No small bowel wall thickening. No small bowel dilatation. The terminal ileum is normal. The appendix is normal. Diverticular changes are noted in the left colon without evidence of diverticulitis. Vascular/Lymphatic: There is abdominal aortic atherosclerosis without aneurysm. There is no gastrohepatic or hepatoduodenal ligament lymphadenopathy. No intraperitoneal or retroperitoneal lymphadenopathy. No pelvic sidewall lymphadenopathy. Reproductive:  Prostate gland is enlarged. Other: No intraperitoneal free fluid. Musculoskeletal: Bilateral groin hernias contain only fat. Bone windows reveal no worrisome lytic or sclerotic osseous lesions. IMPRESSION: 1. Subtle edema/inflammation around the bladder raises the question of bladder infection/inflammation. 2. Enlarged, heterogeneously enhancing prostate gland. Likely related to hypertrophy, but given the stranding in the pelvic floor, correlation for signs/symptoms of prostatitis may be warranted. 3. Bilateral groin hernias contain only fat. 4.  Aortic Atherosclerois (ICD10-170.0) 5. 10 mm hypoattenuating lesion in the interpolar left kidney appears more  irregular than typically seen for a simple cyst. MRI without and with contrast could be performed on an outpatient nonemergent basis to further evaluate. Electronically Signed   By: Misty Stanley M.D.   On: 09/21/2016 20:30    EKG: Independently reviewed.  Assessment/Plan Principal Problem:   Acute bacterial prostatitis Active Problems:   Sepsis ([place])    1. Acute bacterial prostatitis and mild sepsis - 1. IVF: NS at 125 cc/hr 2. Levaquin IV 3. Urine Cx pending 4. Repeat CBC/BMP in AM 5. Tylenol PRN fever / mild pain 6. Norco PRN pain 7. Zofran PRN nausea  DVT prophylaxis: Lovenox Code Status: Full Family Communication: No family in room Disposition Plan: Home after admit Consults called: None Admission status: Place in obs   Jsaon Yoo, [facility] Pager [phone]  If 7AM-7PM, please contact day team taking care of patient [email or web address] Password [facility]  09/21/2016, 10:17 PM

## 2016-09-21 NOTE — ED Notes (Signed)
Attempted to call report and nurse advised she needed to call back.

## 2016-09-21 NOTE — ED Provider Notes (Signed)
[facility] DEPT Provider Note   CSN: [id number] Arrival date & time: 09/21/16  1444     History   Chief Complaint Chief Complaint  Patient presents with  . Abdominal Pain    HPI Martin Navarro is a 66 y.o. male.  HPI Patient presents with abdominal pain. Has had it since Monday with today being Thursday. No nausea vomiting. No diarrhea but has had constipation. Last bowel movement on Monday. Came to the ER yesterday and had blood drawn but the wait was too long so he did not stay. White count was 32 yesterday. Follow with primary care doctor day. Pain is in lower abdomen. Has some dysuria. Has had fevers. Pain is dull.   Past Medical History:  Diagnosis Date  . Asthma   . Emphysema of lung ([place])   . Erectile dysfunction   . Hyperlipidemia     Patient Active Problem List   Diagnosis Date Noted  . 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 Erectile dysfunction 10/25/2010  . GERD (gastroesophageal reflux disease) 10/25/2010  . Hyperlipidemia 10/25/2010  . Nicotine abuse 10/25/2010    History reviewed. No pertinent surgical history.     Home Medications    Prior to Admission medications   Medication Sig Start Date End Date Taking? Authorizing Provider  aspirin 81 MG tablet Take 81 mg by mouth daily.     Yes [provider]  Omega-3 Fatty Acids (FISH OIL) 1200 MG CAPS Take 1,200 mg by mouth daily.   Yes [provider]  omeprazole (PRILOSEC) 40 MG capsule TAKE 1 CAPSULE BY MOUTH EVERY DAY 06/28/16  Yes Burchette, Alinda Sierras, MD  rosuvastatin (CRESTOR) 10 MG tablet TAKE 1 TABLET BY MOUTH EVERY DAY 06/28/16  Yes Burchette, Alinda Sierras, MD  vitamin B-12 (CYANOCOBALAMIN) 1000 MCG tablet Take 1,000-5,000 mcg by mouth daily.    Yes [provider]  vitamin E 400 UNIT capsule Take 1,200 Units by mouth daily.   Yes [provider]  naproxen (NAPROSYN) 500 MG tablet Take 1 tablet (500 mg total) by mouth 2 (two) times daily with a meal. Patient not taking:  Reported on 09/21/2016 06/07/12   Noemi Chapel, MD    Family History Family History  Problem Relation Age of Onset  . Hypertension Father   . Heart disease Father 35    Social History Social History  Substance Use Topics  . Smoking status: Current Every Day Smoker    Packs/day: 1.50    Years: 45.00    Types: Cigarettes  . Smokeless tobacco: Never Used  . Alcohol use No     Allergies   Patient has no known allergies.   Review of Systems Review of Systems  Constitutional: Negative for appetite change and fever.  HENT: Negative for congestion.   Respiratory: Negative for shortness of breath.   Cardiovascular: Negative for chest pain.  Gastrointestinal: Positive for abdominal pain, constipation and nausea. Negative for vomiting.  Genitourinary: Positive for dysuria.  Musculoskeletal: Negative for back pain.  Skin: Negative for pallor and rash.  Neurological: Negative for numbness.  Psychiatric/Behavioral: Negative for confusion.     Physical Exam Updated Vital Signs BP 109/64 (BP Location: Right Arm)   Pulse 87   Temp 98.8 F (37.1 C) (Oral)   Resp 18   SpO2 99%   Physical Exam  Constitutional: He appears well-developed.  HENT:  Head: Normocephalic.  Eyes: Pupils are equal, round, and reactive to light.  Neck: Neck supple.  Cardiovascular:  Mild tachycardia  Pulmonary/Chest: Effort  normal.  Abdominal: Soft. There is no tenderness. There is no guarding.  Genitourinary:  Genitourinary Comments: No perineal tenderness, however rectal exam did show a tender prostate.  Musculoskeletal: He exhibits no edema.  Neurological: He is alert.  Skin: Skin is warm. Capillary refill takes less than 2 seconds.     ED Treatments / Results  Labs (all labs ordered are listed, but only abnormal results are displayed) Labs Reviewed  COMPREHENSIVE METABOLIC PANEL - Abnormal; Notable for the following:       Result Value   Sodium 130 (*)    Chloride 97 (*)    Glucose,  Bld 138 (*)    Creatinine, Ser 1.27 (*)    Albumin 3.4 (*)    ALT 14 (*)    Total Bilirubin 1.4 (*)    GFR calc non Af Amer 57 (*)    All other components within normal limits  CBC - Abnormal; Notable for the following:    WBC 22.7 (*)    All other components within normal limits  URINALYSIS, ROUTINE W REFLEX MICROSCOPIC - Abnormal; Notable for the following:    Color, Urine AMBER (*)    APPearance CLOUDY (*)    Specific Gravity, Urine 1.031 (*)    Hgb urine dipstick MODERATE (*)    Ketones, ur 20 (*)    Protein, ur 100 (*)    Bacteria, UA FEW (*)    Squamous Epithelial / LPF 0-5 (*)    All other components within normal limits  LIPASE, BLOOD  I-STAT CG4 LACTIC ACID, ED  I-STAT CG4 LACTIC ACID, ED    EKG  EKG Interpretation None       Radiology Ct Abdomen Pelvis W Contrast  Result Date: 09/21/2016 CLINICAL DATA:  Abdominal pain and fever. EXAM: CT ABDOMEN AND PELVIS WITH CONTRAST TECHNIQUE: Multidetector CT imaging of the abdomen and pelvis was performed using the standard protocol following bolus administration of intravenous contrast. CONTRAST:  134mL ISOVUE-300 IOPAMIDOL (ISOVUE-300) INJECTION 61% COMPARISON:  None. FINDINGS: Lower chest:  Centrilobular emphysema. Hepatobiliary: No focal abnormality within the liver parenchyma. There is no evidence for gallstones, gallbladder wall thickening, or pericholecystic fluid. No intrahepatic or extrahepatic biliary dilation. Pancreas: No focal mass lesion. No dilatation of the main duct. No intraparenchymal cyst. No peripancreatic edema. Spleen: No splenomegaly. No focal mass lesion. Adrenals/Urinary Tract: No adrenal nodule or mass. Right kidney unremarkable. 10 mm hypoattenuating lesion posterior cortex interpolar left kidney cannot be definitively characterized. No evidence for hydroureter. Bladder wall is ill-defined with subtle perivesicular edema/inflammation evident. Stomach/Bowel: Stomach is nondistended. No gastric wall  thickening. No evidence of outlet obstruction. Duodenum is normally positioned as is the ligament of Treitz. No small bowel wall thickening. No small bowel dilatation. The terminal ileum is normal. The appendix is normal. Diverticular changes are noted in the left colon without evidence of diverticulitis. Vascular/Lymphatic: There is abdominal aortic atherosclerosis without aneurysm. There is no gastrohepatic or hepatoduodenal ligament lymphadenopathy. No intraperitoneal or retroperitoneal lymphadenopathy. No pelvic sidewall lymphadenopathy. Reproductive: Prostate gland is enlarged. Other: No intraperitoneal free fluid. Musculoskeletal: Bilateral groin hernias contain only fat. Bone windows reveal no worrisome lytic or sclerotic osseous lesions. IMPRESSION: 1. Subtle edema/inflammation around the bladder raises the question of bladder infection/inflammation. 2. Enlarged, heterogeneously enhancing prostate gland. Likely related to hypertrophy, but given the stranding in the pelvic floor, correlation for signs/symptoms of prostatitis may be warranted. 3. Bilateral groin hernias contain only fat. 4.  Aortic Atherosclerois (ICD10-170.0) 5. 10 mm hypoattenuating lesion in the  interpolar left kidney appears more irregular than typically seen for a simple cyst. MRI without and with contrast could be performed on an outpatient nonemergent basis to further evaluate. Electronically Signed   By: Misty Stanley M.D.   On: 09/21/2016 20:30    Procedures Procedures (including critical care time)  Medications Ordered in ED Medications  sodium chloride 0.9 % bolus 1,000 mL (1,000 mLs Intravenous New Bag/Given 09/21/16 1954)  iopamidol (ISOVUE-300) 61 % injection (100 mLs  Contrast Given 09/21/16 2003)     Initial Impression / Assessment and Plan / ED Course  I have reviewed the triage vital signs and the nursing notes.  Pertinent labs & imaging results that were available during my care of the patient were reviewed by  me and considered in my medical decision making (see chart for details).     Patient with abdominal pain fevers. Has had some dysuria. Has had some nausea also. White count 30 yesterday. 22 today. With the fevers  and leukocytosis I think patient benefit from at least a short-term admission. Admit to internal medicine.  Final Clinical Impressions(s) / ED Diagnoses   Final diagnoses:  Acute prostatitis    New Prescriptions New Prescriptions   No medications on file     Davonna Belling, MD 09/21/16 2124

## 2016-09-21 NOTE — Progress Notes (Addendum)
HPI:  Acute visit for abd pain/anorexia: -started acutely 2 days ago - reports has not had anything to eat or drink in two days -reports severe difuse pain in abdomen -denies: nausea, vomiting, diarrhea, dysuria, hematuria, melena, hematochezia, CP, SOB, tick bite, recent illness or travel -smoking hx -no BM in in 5 days and no bm or flatulence for last 1 day  ROS: See pertinent positives and negatives per HPI.  Past Medical History:  Diagnosis Date  . Asthma   . Emphysema of lung ([place])   . Erectile dysfunction   . Hyperlipidemia     No past surgical history on file.  Family History  Problem Relation Age of Onset  . Hypertension Father   . Heart disease Father 74    Social History   Social History  . Marital status: Divorced    Spouse name: N/A  . Number of children: N/A  . Years of education: N/A   Social History Main Topics  . Smoking status: Current Every Day Smoker    Packs/day: 1.50    Years: 45.00    Types: Cigarettes  . Smokeless tobacco: Never Used  . Alcohol use No  . Drug use: Unknown  . Sexual activity: Not Asked   Other Topics Concern  . None   Social History Narrative  . None     Current Outpatient Prescriptions:  .  aspirin 81 MG tablet, Take 81 mg by mouth daily.  , Disp: , Rfl:  .  calcium-vitamin D (OSCAL WITH D) 500-200 MG-UNIT per tablet, Take 1 tablet by mouth daily.  , Disp: , Rfl:  .  naproxen (NAPROSYN) 500 MG tablet, Take 1 tablet (500 mg total) by mouth 2 (two) times daily with a meal., Disp: 30 tablet, Rfl: 0 .  Omega-3 Fatty Acids (FISH OIL) 300 MG CAPS, Take by mouth daily.  , Disp: , Rfl:  .  omeprazole (PRILOSEC) 40 MG capsule, TAKE 1 CAPSULE BY MOUTH EVERY DAY, Disp: 90 capsule, Rfl: 1 .  rosuvastatin (CRESTOR) 10 MG tablet, TAKE 1 TABLET BY MOUTH EVERY DAY, Disp: 90 tablet, Rfl: 1 .  vitamin B-12 (CYANOCOBALAMIN) 1000 MCG tablet, Take 1,000 mcg by mouth daily., Disp: , Rfl:   EXAM:  Vitals:   09/21/16 1328  BP: 120/70   Pulse: (!) 108  Temp: (!) 100.4 F (38 C)    There is no height or weight on file to calculate BMI.  GENERAL: vitals reviewed and listed above, alert, oriented, appears well hydrated and in no acute distress  HEENT: atraumatic, conjunttiva clear, no obvious abnormalities on inspection of external nose and ears  NECK: no obvious masses on inspection  LUNGS: clear to auscultation bilaterally, no wheezes, rales or rhonchi, good air movement  CV: HRRR, no peripheral edema  ABD: BS hypoactive, fairly soft, TTP lower abd and mild guarding  MS: moves all extremities without noticeable abnormality  PSYCH: pleasant and cooperative, no obvious depression or anxiety  ASSESSMENT AND PLAN:  Discussed the following assessment and plan:  Generalized abdominal pain - Plan: POCT Urinalysis Dipstick (Automated)  Fever, unspecified fever cause  Dehydration  -discussed potential etiologies and options for evaluation -reported severe abd pain, fever, anorexia, mild tachy -advised prompt eval with labs and CT and opted to have him undergo this evaluation in the ER as will need hydration prior to imaging -offered EMS but prefers to go by private car - has driver and denies AMS, dizziness - assistant to notify his choice of hospital ER  triage of situation -advised that he go straight there for evaluation  Patient Instructions  Go straight to the hospital for evaluation.   Colin Benton R., DO

## 2016-09-22 DIAGNOSIS — Z79899 Other long term (current) drug therapy: Secondary | ICD-10-CM | POA: Diagnosis not present

## 2016-09-22 DIAGNOSIS — F17210 Nicotine dependence, cigarettes, uncomplicated: Secondary | ICD-10-CM | POA: Diagnosis present

## 2016-09-22 DIAGNOSIS — K5900 Constipation, unspecified: Secondary | ICD-10-CM | POA: Diagnosis present

## 2016-09-22 DIAGNOSIS — A419 Sepsis, unspecified organism: Secondary | ICD-10-CM | POA: Diagnosis not present

## 2016-09-22 DIAGNOSIS — R110 Nausea: Secondary | ICD-10-CM | POA: Diagnosis not present

## 2016-09-22 DIAGNOSIS — Z7982 Long term (current) use of aspirin: Secondary | ICD-10-CM | POA: Diagnosis not present

## 2016-09-22 DIAGNOSIS — E876 Hypokalemia: Secondary | ICD-10-CM | POA: Diagnosis not present

## 2016-09-22 DIAGNOSIS — N410 Acute prostatitis: Secondary | ICD-10-CM | POA: Diagnosis not present

## 2016-09-22 DIAGNOSIS — R300 Dysuria: Secondary | ICD-10-CM | POA: Diagnosis present

## 2016-09-22 DIAGNOSIS — K219 Gastro-esophageal reflux disease without esophagitis: Secondary | ICD-10-CM

## 2016-09-22 DIAGNOSIS — E785 Hyperlipidemia, unspecified: Secondary | ICD-10-CM | POA: Diagnosis present

## 2016-09-22 DIAGNOSIS — N179 Acute kidney failure, unspecified: Secondary | ICD-10-CM | POA: Diagnosis not present

## 2016-09-22 LAB — BASIC METABOLIC PANEL
ANION GAP: 8 (ref 5–15)
BUN: 16 mg/dL (ref 6–20)
CHLORIDE: 105 mmol/L (ref 101–111)
CO2: 23 mmol/L (ref 22–32)
CREATININE: 0.91 mg/dL (ref 0.61–1.24)
Calcium: 8.2 mg/dL — ABNORMAL LOW (ref 8.9–10.3)
GFR calc non Af Amer: >60 mL/min (ref >60)
Glucose, Bld: 101 mg/dL — ABNORMAL HIGH (ref 65–99)
POTASSIUM: 3.6 mmol/L (ref 3.5–5.1)
SODIUM: 136 mmol/L (ref 135–145)

## 2016-09-22 LAB — CBC
HEMATOCRIT: 39.4 % (ref 39.0–52.0)
HEMOGLOBIN: 12.7 g/dL — AB (ref 13.0–17.0)
MCH: 29.8 pg (ref 26.0–34.0)
MCHC: 32.2 g/dL (ref 30.0–36.0)
MCV: 92.5 fL (ref 78.0–100.0)
Platelets: 128 10*3/uL — ABNORMAL LOW (ref 150–400)
RBC: 4.26 MIL/uL (ref 4.22–5.81)
RDW: 13.7 % (ref 11.5–15.5)
WBC: 13 10*3/uL — AB (ref 4.0–10.5)

## 2016-09-22 NOTE — Progress Notes (Signed)
PROGRESS NOTE    Martin Navarro  OZH:[id number] DOB: 11-29-1950 DOA: 09/21/2016 PCP: [name], MD   Chief Complaint  Patient presents with  . Abdominal Pain    Brief Narrative:  HPI on 09/21/2016 by Dr. Jennette Kettle Martin Navarro is a 66 y.o. male with medical history significant of Emphyzema.  Patient presented to the ED yesterday with abd pain but left before being seen due to long wait.  He returns to ED today.  Abd pain is in lower abd, associated dysuria, has had some constipation but no N/V/D.  Has had fevers (100.4 in ED).  Pain is dul. Assessment & Plan   Sepsis secondary to prostatitis -Patient presented with fever, tachycardia, leukocytosis. Patient did have spike in fever after admission, up to 103.2 -Leukocytosis improved from 32 to 13 -CT abdomen and pelvis: Subtle edema, inflammation and the bladder, enlarged heterogeneously enhancing prostate gland likely prostatitis -UA: Showed 6-30 WBC, few bacteria, moderate hemoglobin, negative leukocytes and nitrites -Pending urine and blood cultures -Blood cultures obtained after antibiotics were given -Patient will likely need at least one month of antibiotics and urological follow-up. Have discussed case with urology, Dr. Jeffie Pollock who will see the patient as an outpatient -Currently on levaquin IV and IV fluids- as patient has poor oral intake and continues to spike fevers  -continue tylenol  Nausea -likely secondary to sepsis and infection -Patient admits to poor oral intake over the last couple of days -Continue antiemetics as needed -Discussed dietary options including possibly soft foods like mashed potatoes. Encourage fluid intake if possible  Left kidney lesion -Noted on CT abdomen pelvis, 10 mm hypoattenuating lesion in the left interpolar left kidney appears more irregular than typically seen for simple cyst -Patient should have MRI with and without contrast as an outpatient  Acute kidney injury -Creatinine  was 1.27 upon admission -Likely secondary to sepsis and poor oral intake -Creatinine improved, currently 0.91 (back to baseline) -Continue to monitor BMP  Hyperlipidemia -Continue statin and omega-3's  GERD -Continue PPI  DVT Prophylaxis  lovenxo  Code Status: Full  Family Communication: Family at bedside  Disposition Plan: Admitted. Will continue IV antibiotics, await culture results. Continue IV fluids.   Consultants Dr. Jeffie Pollock, urology, via phone  Procedures  None  Antibiotics   Anti-infectives    Start     Dose/Rate Route Frequency Ordered Stop   09/21/16 2300  levofloxacin (LEVAQUIN) IVPB 750 mg     750 mg 100 mL/hr over 90 Minutes Intravenous Every 24 hours 09/21/16 2233        Subjective:   Martin Navarro seen and examined today.  Patient continues to have some abdominal pain located in the lower area. Did have fever overnight. Has not been able to eat or drink as he is afraid. Denies current chest pain, shortness of breath, diarrhea, vomiting, constipation, headache or dizziness.  Objective:   Vitals:   09/21/16 2300 09/22/16 0015 09/22/16 0130 09/22/16 0454  BP: 120/75 (!) 139/59  103/81  Pulse: 98 (!) 102  79  Resp: 18 18  18   Temp:  (!) 103.2 F (39.6 C) 98.1 F (36.7 C) 98.2 F (36.8 C)  TempSrc:   Oral Oral  SpO2: 95% 100%  96%  Weight:  78.3 kg (172 lb 11.2 oz)    Height:  5\' 5"  (1.651 m)      Intake/Output Summary (Last 24 hours) at 09/22/16 1204 Last data filed at 09/22/16 0433  Gross per 24 hour  Intake  924.17 ml  Output              220 ml  Net           704.17 ml   Filed Weights   09/21/16 2200 09/22/16 0015  Weight: 80.7 kg (178 lb) 78.3 kg (172 lb 11.2 oz)    Exam  General: Well developed, well nourished, NAD, appears stated age  HEENT: NCAT, mucous membranes moist.   Cardiovascular: S1 S2 auscultated, no rubs, murmurs or gallops. Regular rate and rhythm.  Respiratory: Clear to auscultation bilaterally with equal  chest rise  Abdomen: Soft, suprapubic TTP, nondistended, + bowel sounds  Extremities: warm dry without cyanosis clubbing or edema  Neuro: AAOx3, nonfocal  Skin: Without rashes exudates or nodules  Psych: Normal affect and demeanor with intact judgement and insight   Data Reviewed: I have personally reviewed following labs and imaging studies  CBC:  Recent Labs Lab 09/20/16 1828 09/21/16 1458 09/22/16 0445  WBC 32.0* 22.7* 13.0*  NEUTROABS 27.2*  --   --   HGB 15.3 14.9 12.7*  HCT 45.4 44.4 39.4  MCV 93.0 92.9 92.5  PLT 209 159 027*   Basic Metabolic Panel:  Recent Labs Lab 09/20/16 1828 09/21/16 1458 09/22/16 0445  NA 134* 130* 136  K 3.8 4.2 3.6  CL 102 97* 105  CO2 20* 24 23  GLUCOSE 144* 138* 101*  BUN 12 18 16   CREATININE 1.20 1.27* 0.91  CALCIUM 9.3 9.1 8.2*   GFR: Estimated Creatinine Clearance: 76 mL/min (by C-G formula based on SCr of 0.91 mg/dL). Liver Function Tests:  Recent Labs Lab 09/20/16 1828 09/21/16 1458  AST 18 16  ALT 13* 14*  ALKPHOS 65 83  BILITOT 1.5* 1.4*  PROT 7.3 7.2  ALBUMIN 3.6 3.4*    Recent Labs Lab 09/21/16 1458  LIPASE 18   No results for input(s): AMMONIA in the last 168 hours. Coagulation Profile:  Recent Labs Lab 09/20/16 1828  INR 1.18   Cardiac Enzymes: No results for input(s): CKTOTAL, CKMB, CKMBINDEX, TROPONINI in the last 168 hours. BNP (last 3 results) No results for input(s): PROBNP in the last 8760 hours. HbA1C: No results for input(s): HGBA1C in the last 72 hours. CBG: No results for input(s): GLUCAP in the last 168 hours. Lipid Profile: No results for input(s): CHOL, HDL, LDLCALC, TRIG, CHOLHDL, LDLDIRECT in the last 72 hours. Thyroid Function Tests: No results for input(s): TSH, T4TOTAL, FREET4, T3FREE, THYROIDAB in the last 72 hours. Anemia Panel: No results for input(s): VITAMINB12, FOLATE, FERRITIN, TIBC, IRON, RETICCTPCT in the last 72 hours. Urine analysis:    Component Value  Date/Time   COLORURINE AMBER (A) 09/21/2016 1506   APPEARANCEUR CLOUDY (A) 09/21/2016 1506   LABSPEC 1.031 (H) 09/21/2016 1506   PHURINE 5.0 09/21/2016 1506   GLUCOSEU NEGATIVE 09/21/2016 1506   HGBUR MODERATE (A) 09/21/2016 1506   BILIRUBINUR NEGATIVE 09/21/2016 1506   BILIRUBINUR negative 09/21/2016 1334   KETONESUR 20 (A) 09/21/2016 1506   PROTEINUR 100 (A) 09/21/2016 1506   UROBILINOGEN 1.0 09/21/2016 1334   UROBILINOGEN 0.2 06/07/2012 1635   NITRITE NEGATIVE 09/21/2016 1506   LEUKOCYTESUR NEGATIVE 09/21/2016 1506   Sepsis Labs: @LABRCNTIP (procalcitonin:4,lacticidven:4)  )No results found for this or any previous visit (from the past 240 hour(s)).    Radiology Studies: Ct Abdomen Pelvis W Contrast  Result Date: 09/21/2016 CLINICAL DATA:  Abdominal pain and fever. EXAM: CT ABDOMEN AND PELVIS WITH CONTRAST TECHNIQUE: Multidetector CT imaging of the abdomen and  pelvis was performed using the standard protocol following bolus administration of intravenous contrast. CONTRAST:  151mL ISOVUE-300 IOPAMIDOL (ISOVUE-300) INJECTION 61% COMPARISON:  None. FINDINGS: Lower chest:  Centrilobular emphysema. Hepatobiliary: No focal abnormality within the liver parenchyma. There is no evidence for gallstones, gallbladder wall thickening, or pericholecystic fluid. No intrahepatic or extrahepatic biliary dilation. Pancreas: No focal mass lesion. No dilatation of the main duct. No intraparenchymal cyst. No peripancreatic edema. Spleen: No splenomegaly. No focal mass lesion. Adrenals/Urinary Tract: No adrenal nodule or mass. Right kidney unremarkable. 10 mm hypoattenuating lesion posterior cortex interpolar left kidney cannot be definitively characterized. No evidence for hydroureter. Bladder wall is ill-defined with subtle perivesicular edema/inflammation evident. Stomach/Bowel: Stomach is nondistended. No gastric wall thickening. No evidence of outlet obstruction. Duodenum is normally positioned as is the  ligament of Treitz. No small bowel wall thickening. No small bowel dilatation. The terminal ileum is normal. The appendix is normal. Diverticular changes are noted in the left colon without evidence of diverticulitis. Vascular/Lymphatic: There is abdominal aortic atherosclerosis without aneurysm. There is no gastrohepatic or hepatoduodenal ligament lymphadenopathy. No intraperitoneal or retroperitoneal lymphadenopathy. No pelvic sidewall lymphadenopathy. Reproductive: Prostate gland is enlarged. Other: No intraperitoneal free fluid. Musculoskeletal: Bilateral groin hernias contain only fat. Bone windows reveal no worrisome lytic or sclerotic osseous lesions. IMPRESSION: 1. Subtle edema/inflammation around the bladder raises the question of bladder infection/inflammation. 2. Enlarged, heterogeneously enhancing prostate gland. Likely related to hypertrophy, but given the stranding in the pelvic floor, correlation for signs/symptoms of prostatitis may be warranted. 3. Bilateral groin hernias contain only fat. 4.  Aortic Atherosclerois (ICD10-170.0) 5. 10 mm hypoattenuating lesion in the interpolar left kidney appears more irregular than typically seen for a simple cyst. MRI without and with contrast could be performed on an outpatient nonemergent basis to further evaluate. Electronically Signed   By: Misty Stanley M.D.   On: 09/21/2016 20:30     Scheduled Meds: . aspirin  81 mg Oral Daily  . enoxaparin (LOVENOX) injection  40 mg Subcutaneous Q24H  . omega-3 acid ethyl esters  1,000 mg Oral Daily  . pantoprazole  80 mg Oral Daily  . rosuvastatin  10 mg Oral Daily  . vitamin B-12  1,000-5,000 mcg Oral Daily  . vitamin E  1,200 Units Oral Daily   Continuous Infusions: . sodium chloride 125 mL/hr at 09/21/16 2319  . levofloxacin (LEVAQUIN) IV 750 mg (09/21/16 2324)     LOS: 0 days   Time Spent in minutes   30 minutes  Brysten Reister D.O. on 09/22/2016 at 12:04 PM  Between 7am to 7pm - Pager -  [phone]  After 7pm go to [email or web address] - password [email or web address]  And look for the night coverage person covering for me after hours  [facility] Office  [phone]

## 2016-09-22 NOTE — Progress Notes (Signed)
Pt arrived the floor in the company of wife. Pt was assessed to be AxOx4. Pt was assessed to be independent. Vital signs completed. Call bell was placed within reach. Tylenol was administered for fever. Will continue to monitor.

## 2016-09-23 DIAGNOSIS — E876 Hypokalemia: Secondary | ICD-10-CM

## 2016-09-23 LAB — BASIC METABOLIC PANEL
Anion gap: 5 (ref 5–15)
BUN: 9 mg/dL (ref 6–20)
CHLORIDE: 107 mmol/L (ref 101–111)
CO2: 22 mmol/L (ref 22–32)
CREATININE: 0.81 mg/dL (ref 0.61–1.24)
Calcium: 7.9 mg/dL — ABNORMAL LOW (ref 8.9–10.3)
GFR calc Af Amer: >60 mL/min (ref >60)
GFR calc non Af Amer: >60 mL/min (ref >60)
Glucose, Bld: 98 mg/dL (ref 65–99)
Potassium: 3.4 mmol/L — ABNORMAL LOW (ref 3.5–5.1)
Sodium: 134 mmol/L — ABNORMAL LOW (ref 135–145)

## 2016-09-23 LAB — URINE CULTURE

## 2016-09-23 LAB — CBC
HEMATOCRIT: 35 % — AB (ref 39.0–52.0)
HEMOGLOBIN: 11.5 g/dL — AB (ref 13.0–17.0)
MCH: 29.9 pg (ref 26.0–34.0)
MCHC: 32.9 g/dL (ref 30.0–36.0)
MCV: 91.1 fL (ref 78.0–100.0)
Platelets: 117 10*3/uL — ABNORMAL LOW (ref 150–400)
RBC: 3.84 MIL/uL — ABNORMAL LOW (ref 4.22–5.81)
RDW: 13.8 % (ref 11.5–15.5)
WBC: 6.6 10*3/uL (ref 4.0–10.5)

## 2016-09-23 MED ORDER — LEVOFLOXACIN 500 MG PO TABS: 500.0000 mg | ORAL_TABLET | Freq: Every day | ORAL | 0 refills | Status: AC

## 2016-09-23 MED ORDER — POTASSIUM CHLORIDE CRYS ER 20 MEQ PO TBCR
40.0000 meq | EXTENDED_RELEASE_TABLET | Freq: Once | ORAL | Status: AC
  Administered 2016-09-23: 40 meq via ORAL
  Filled 2016-09-23: qty 2

## 2016-09-23 NOTE — Discharge Instructions (Signed)
Prostatitis Prostatitis is swelling of the prostate gland. The prostate helps to make semen. It is below a man's bladder, in front of the rectum. There are different types of prostatitis. Follow these instructions at home:  Take over-the-counter and prescription medicines only as told by your doctor.  If you were prescribed an antibiotic medicine, take it as told by your doctor. Do not stop taking the antibiotic even if you start to feel better.  If your doctor prescribed exercises, do them as directed.  Take sitz baths as told by your doctor. To take a sitz bath, sit in warm water that is deep enough to cover your hips and butt.  Keep all follow-up visits as told by your doctor. This is important. Contact a doctor if:  Your symptoms get worse.  You have a fever. Get help right away if:  You have chills.  You feel sick to your stomach (nauseous).  You throw up (vomit).  You feel light-headed.  You feel like you might pass out (faint).  You cannot pee (urinate).  You have blood or clumps of blood (blood clots) in your pee (urine). This information is not intended to replace advice given to you by your health care provider. Make sure you discuss any questions you have with your health care provider. Document Released: 09/12/2011 Document Revised: 12/02/2015 Document Reviewed: 12/02/2015 Elsevier Interactive Patient Education  2017 [employer]  

## 2016-09-23 NOTE — Discharge Summary (Signed)
Physician Discharge Summary  Martin Navarro DVV:[id number] DOB: 06-27-1950 DOA: 09/21/2016  PCP: [name], MD  Admit date: 09/21/2016 Discharge date: 09/23/2016  Time spent: 45 minutes  Recommendations for Outpatient Follow-up:  Patient will be discharged to home.  Patient will need to follow up with primary care provider within one week of discharge, repeat BMP.  Follow up with urology, Dr. Jeffie Pollock, in 3-4 weeks. Patient should continue medications as prescribed.  Patient should follow a regular diet.   Discharge Diagnoses:  Sepsis secondary to prostatitis Nausea Left kidney lesion Acute kidney injury Hyperlipidemia GERD  Discharge Condition: stable  Diet recommendation: regular  Filed Weights   09/21/16 2200 09/22/16 0015  Weight: 80.7 kg (178 lb) 78.3 kg (172 lb 11.2 oz)    History of present illness:  on 09/21/2016 by Dr. Burnett Sheng a 66 y.o.malewith medical history significant of Emphyzema. Patient presented to the ED yesterday with abd pain but left before being seen due to long wait. He returns to ED today. Abd pain is in lower abd, associated dysuria, has had some constipation but no N/V/D. Has had fevers (100.4 in ED). Pain is dul.  Hospital Course:  Sepsis secondary to prostatitis -Patient presented with fever, tachycardia, leukocytosis.  -VSS stable  -Leukocytosis resolved -CT abdomen and pelvis: Subtle edema, inflammation and the bladder, enlarged heterogeneously enhancing prostate gland likely prostatitis -UA: Showed 6-30 WBC, few bacteria, moderate hemoglobin, negative leukocytes and nitrites -Urine cultures show multiple species -Blood cultures no growth to date -Blood cultures obtained after antibiotics were given -Patient will likely need at least one month of antibiotics and urological follow-up. Have discussed case with urology, Dr. Jeffie Pollock who will see the patient as an outpatient  -continue tylenol for fevers -Placed on  IV levaquin. Will discharge with levaquin. Have recommended patient eat yogurt with probiotics and take supplemental probiotics given that levaquin could cause Cdiff -Unfortunately, patient did present with AKI, bactrim not a good substitute either -Discussed with daughter what signs and symptoms to look for, such as fever not responding to tylenol, chills, rigors, increased abd pain- return to the ER  Nausea -likely secondary to sepsis and infection -Patient admits to poor oral intake over the last couple of days -Continue antiemetics as needed -Discussed dietary options including possibly soft foods like mashed potatoes. Encourage fluid intake if possible  Left kidney lesion -Noted on CT abdomen pelvis, 10 mm hypoattenuating lesion in the left interpolar left kidney appears more irregular than typically seen for simple cyst -Patient should have MRI with and without contrast as an outpatient  Acute kidney injury -Creatinine was 1.27 upon admission -Likely secondary to sepsis and poor oral intake -Creatinine improved, currently 0.81 (back to baseline)  Hyperlipidemia -Continue statin and omega-3's  Hypokalemia -Replaced, would repeat BMP in 1 week  GERD -Continue PPI  Procedures:  none  Consultations:  Dr. Jeffie Pollock, urology, via phone  Discharge Exam: Vitals:   09/23/16 0105 09/23/16 0522  BP:  127/69  Pulse:  70  Resp:  18  Temp: 98.7 F (37.1 C) 98.4 F (36.9 C)   Patient feeling better today. Denies Chest pain, shortness breath, abdominal pain, nausea, vomiting, diarrhea, constipation, dizziness or headache.   General: Well developed, well nourished, NAD, appears stated age  HEENT: NCAT, mucous membranes moist.  Cardiovascular: S1 S2 auscultated, no rubs, murmurs or gallops. Regular rate and rhythm.  Respiratory: Clear to auscultation bilaterally with equal chest rise  Abdomen: Soft, nontender, nondistended, + bowel sounds  Extremities: warm dry without  cyanosis clubbing or edema  Neuro: AAOx3, nonfocal  Psych: Normal affect and demeanor with intact judgement and insight, pleasant  Discharge Instructions Discharge Instructions    Discharge instructions    Complete by:  As directed    Patient will be discharged to home.  Patient will need to follow up with primary care provider within one week of discharge, repeat BMP.  Follow up with urology, Dr. Jeffie Pollock, in 3-4 weeks. Patient should continue medications as prescribed.  Patient should follow a regular diet.     Current Discharge Medication List    CONTINUE these medications which have NOT CHANGED   Details  aspirin 81 MG tablet Take 81 mg by mouth daily.      Omega-3 Fatty Acids (FISH OIL) 1200 MG CAPS Take 1,200 mg by mouth daily.    omeprazole (PRILOSEC) 40 MG capsule TAKE 1 CAPSULE BY MOUTH EVERY DAY Qty: 90 capsule, Refills: 1    rosuvastatin (CRESTOR) 10 MG tablet TAKE 1 TABLET BY MOUTH EVERY DAY Qty: 90 tablet, Refills: 1    vitamin B-12 (CYANOCOBALAMIN) 1000 MCG tablet Take 1,000-5,000 mcg by mouth daily.     vitamin E 400 UNIT capsule Take 1,200 Units by mouth daily.       No Known Allergies Follow-up Information    [name], MD. Schedule an appointment as soon as possible for a visit in 1 week(s).   Specialty:  Family Medicine Why:  Hospital follow up Contact information: [place] [address] [phone]            The results of significant diagnostics from this hospitalization (including imaging, microbiology, ancillary and laboratory) are listed below for reference.    Significant Diagnostic Studies: Ct Abdomen Pelvis W Contrast  Result Date: 09/21/2016 CLINICAL DATA:  Abdominal pain and fever. EXAM: CT ABDOMEN AND PELVIS WITH CONTRAST TECHNIQUE: Multidetector CT imaging of the abdomen and pelvis was performed using the standard protocol following bolus administration of intravenous contrast. CONTRAST:  119mL  ISOVUE-300 IOPAMIDOL (ISOVUE-300) INJECTION 61% COMPARISON:  None. FINDINGS: Lower chest:  Centrilobular emphysema. Hepatobiliary: No focal abnormality within the liver parenchyma. There is no evidence for gallstones, gallbladder wall thickening, or pericholecystic fluid. No intrahepatic or extrahepatic biliary dilation. Pancreas: No focal mass lesion. No dilatation of the main duct. No intraparenchymal cyst. No peripancreatic edema. Spleen: No splenomegaly. No focal mass lesion. Adrenals/Urinary Tract: No adrenal nodule or mass. Right kidney unremarkable. 10 mm hypoattenuating lesion posterior cortex interpolar left kidney cannot be definitively characterized. No evidence for hydroureter. Bladder wall is ill-defined with subtle perivesicular edema/inflammation evident. Stomach/Bowel: Stomach is nondistended. No gastric wall thickening. No evidence of outlet obstruction. Duodenum is normally positioned as is the ligament of Treitz. No small bowel wall thickening. No small bowel dilatation. The terminal ileum is normal. The appendix is normal. Diverticular changes are noted in the left colon without evidence of diverticulitis. Vascular/Lymphatic: There is abdominal aortic atherosclerosis without aneurysm. There is no gastrohepatic or hepatoduodenal ligament lymphadenopathy. No intraperitoneal or retroperitoneal lymphadenopathy. No pelvic sidewall lymphadenopathy. Reproductive: Prostate gland is enlarged. Other: No intraperitoneal free fluid. Musculoskeletal: Bilateral groin hernias contain only fat. Bone windows reveal no worrisome lytic or sclerotic osseous lesions. IMPRESSION: 1. Subtle edema/inflammation around the bladder raises the question of bladder infection/inflammation. 2. Enlarged, heterogeneously enhancing prostate gland. Likely related to hypertrophy, but given the stranding in the pelvic floor, correlation for signs/symptoms of prostatitis may be warranted. 3. Bilateral groin hernias contain only  fat.  4.  Aortic Atherosclerois (ICD10-170.0) 5. 10 mm hypoattenuating lesion in the interpolar left kidney appears more irregular than typically seen for a simple cyst. MRI without and with contrast could be performed on an outpatient nonemergent basis to further evaluate. Electronically Signed   By: Misty Stanley M.D.   On: 09/21/2016 20:30    Microbiology: Recent Results (from the past 240 hour(s))  Urine culture     Status: Abnormal   Collection Time: 09/21/16 10:05 PM  Result Value Ref Range Status   Specimen Description URINE, CLEAN CATCH  Final   Special Requests NONE  Final   Culture MULTIPLE SPECIES PRESENT, SUGGEST RECOLLECTION (A)  Final   Report Status 09/23/2016 FINAL  Final     Labs: Basic Metabolic Panel:  Recent Labs Lab 09/20/16 1828 09/21/16 1458 09/22/16 0445 09/23/16 0311  NA 134* 130* 136 134*  K 3.8 4.2 3.6 3.4*  CL 102 97* 105 107  CO2 20* 24 23 22   GLUCOSE 144* 138* 101* 98  BUN 12 18 16 9   CREATININE 1.20 1.27* 0.91 0.81  CALCIUM 9.3 9.1 8.2* 7.9*   Liver Function Tests:  Recent Labs Lab 09/20/16 1828 09/21/16 1458  AST 18 16  ALT 13* 14*  ALKPHOS 65 83  BILITOT 1.5* 1.4*  PROT 7.3 7.2  ALBUMIN 3.6 3.4*    Recent Labs Lab 09/21/16 1458  LIPASE 18   No results for input(s): AMMONIA in the last 168 hours. CBC:  Recent Labs Lab 09/20/16 1828 09/21/16 1458 09/22/16 0445 09/23/16 0311  WBC 32.0* 22.7* 13.0* 6.6  NEUTROABS 27.2*  --   --   --   HGB 15.3 14.9 12.7* 11.5*  HCT 45.4 44.4 39.4 35.0*  MCV 93.0 92.9 92.5 91.1  PLT 209 159 128* 117*   Cardiac Enzymes: No results for input(s): CKTOTAL, CKMB, CKMBINDEX, TROPONINI in the last 168 hours. BNP: BNP (last 3 results) No results for input(s): BNP in the last 8760 hours.  ProBNP (last 3 results) No results for input(s): PROBNP in the last 8760 hours.  CBG: No results for input(s): GLUCAP in the last 168 hours.     SignedCristal Ford  [facility] 09/23/2016, 9:51 AM

## 2016-09-23 NOTE — Progress Notes (Signed)
Patient discharge teaching given, including activity, diet, follow-up appoints, and medications. Patient verbalized understanding of all discharge instructions. IV access was d/c'd. Vitals are stable. Skin is intact except as charted in most recent assessments. Pt to be escorted out by NT, to be driven home by family. 

## 2016-09-23 NOTE — Care Management Note (Signed)
Case Management Note  Patient Details  Name: Martin Navarro MRN: [id number] Date of Birth: 05/10/1950  Subjective/Objective:                 Patient with order to DC to home. Chart reviewed, No CM needs identified.    Action/Plan:  DC to home self care Expected Discharge Date:  09/23/16               Expected Discharge Plan:  Home/Self Care  In-House Referral:     Discharge planning Services  CM Consult  Post Acute Care Choice:    Choice offered to:     DME Arranged:    DME Agency:     HH Arranged:    HH Agency:     Status of Service:  Completed, signed off  If discussed at [employer] of Stay Meetings, dates discussed:    Additional Comments:  Carles Collet, RN 09/23/2016, 11:59 AM

## 2016-09-26 ENCOUNTER — Telehealth: Payer: Self-pay

## 2016-09-26 NOTE — Telephone Encounter (Signed)
D/C 09/23/16 To: home  Spoke with pt's daughter, [name]. She states that pt is doing well. He does report some continued burning with urination. He is taking all medications as directed. She is aware that if symptoms increase pt needs to be evaluated immediately. Pt does also report some mild constipation. Advised daughter to make sure pt is staying hydrated, increase daily fiber and contact office if not improving or goes more than 3 days without a bowel movement. Pt has returned to work.   Appt scheduled with Dr. Elease Hashimoto 10/02/16, pt aware.    Transition Care Management Follow-up Telephone Call  How have you been since you were released from the hospital? Good, improving slowly but able to return to work   Do you understand why you were in the hospital? yes   Do you understand the discharge instrcutions? yes  Items Reviewed:  Medications reviewed: yes  Allergies reviewed: yes  Dietary changes reviewed: yes  Referrals reviewed: yes   Functional Questionnaire:   Activities of Daily Living (ADLs):   He states they are independent in the following: ambulation, bathing and hygiene, feeding, continence, grooming, toileting and dressing States they require assistance with the following: none   Any transportation issues/concerns?: no   Any patient concerns? no   Confirmed importance and date/time of follow-up visits scheduled: yes   Confirmed with patient if condition begins to worsen call PCP or go to the ER.  Patient was given the Call-a-Nurse line [phone]: yes

## 2016-09-27 LAB — CULTURE, BLOOD (ROUTINE X 2)
CULTURE: NO GROWTH
Culture: NO GROWTH
Special Requests: ADEQUATE

## 2016-10-03 ENCOUNTER — Encounter: Payer: Self-pay | Admitting: Family Medicine

## 2016-10-03 ENCOUNTER — Ambulatory Visit (INDEPENDENT_AMBULATORY_CARE_PROVIDER_SITE_OTHER): Payer: Medicare Other | Admitting: Family Medicine

## 2016-10-03 VITALS — BP 120/80 | HR 71 | Temp 98.3°F | Wt 169.8 lb

## 2016-10-03 DIAGNOSIS — N410 Acute prostatitis: Secondary | ICD-10-CM

## 2016-10-03 DIAGNOSIS — N289 Disorder of kidney and ureter, unspecified: Secondary | ICD-10-CM

## 2016-10-03 LAB — CBC WITH DIFFERENTIAL/PLATELET
Basophils Absolute: 0.1 10*3/uL (ref 0.0–0.1)
Basophils Relative: 0.7 % (ref 0.0–3.0)
EOS PCT: 1.3 % (ref 0.0–5.0)
Eosinophils Absolute: 0.2 10*3/uL (ref 0.0–0.7)
HCT: 40.5 % (ref 39.0–52.0)
Hemoglobin: 13.4 g/dL (ref 13.0–17.0)
LYMPHS ABS: 2.6 10*3/uL (ref 0.7–4.0)
Lymphocytes Relative: 18.8 % (ref 12.0–46.0)
MCHC: 33 g/dL (ref 30.0–36.0)
MCV: 92.6 fl (ref 78.0–100.0)
MONO ABS: 1.3 10*3/uL — AB (ref 0.1–1.0)
MONOS PCT: 9.5 % (ref 3.0–12.0)
NEUTROS ABS: 9.5 10*3/uL — AB (ref 1.4–7.7)
NEUTROS PCT: 69.7 % (ref 43.0–77.0)
PLATELETS: 440 10*3/uL — AB (ref 150.0–400.0)
RBC: 4.37 Mil/uL (ref 4.22–5.81)
RDW: 14.6 % (ref 11.5–15.5)
WBC: 13.6 10*3/uL — ABNORMAL HIGH (ref 4.0–10.5)

## 2016-10-03 LAB — BASIC METABOLIC PANEL
BUN: 14 mg/dL (ref 6–23)
CALCIUM: 9.5 mg/dL (ref 8.4–10.5)
CO2: 29 meq/L (ref 19–32)
Chloride: 102 mEq/L (ref 96–112)
Creatinine, Ser: 1.05 mg/dL (ref 0.40–1.50)
GFR: 74.81 mL/min (ref >60.00)
GLUCOSE: 97 mg/dL (ref 70–99)
POTASSIUM: 4.7 meq/L (ref 3.5–5.1)
SODIUM: 139 meq/L (ref 135–145)

## 2016-10-03 MED ORDER — LORAZEPAM 1 MG PO TABS: ORAL_TABLET | ORAL | 0 refills | Status: DC

## 2016-10-03 NOTE — Patient Instructions (Signed)
Stay well hydrated Martin Navarro out the antibiotic We will set up MRI to assess left kidney Follow up for any fever, vomiting, diarrhea, or decreased urination.

## 2016-10-03 NOTE — Progress Notes (Signed)
Subjective:     Patient ID: Martin Navarro, male   DOB: 1950/06/07, 66 y.o.   MRN: [id number]  HPI Patient seen for hospital follow-up. He was admitted on June 28 with prostatitis and possible sepsis. He had noted symptoms onset a few days prior of some urine frequency and mild burning along with increased fatigue. He actually went to the ER the day before admission but waited for a few hours and in a bleeding. He was seen the next day in her office here in an referred promptly to the ER for further evaluation. He apparently had some lower abdominal pain with associated dysuria. He had fever 100.4 in the emergency department. Also had some tachycardia and leukocytosis with white count 32,000.  Acute kidney injury with creatinine 1.27 and this was back down to normal at discharge. Urine cultures grew out multiple species. Blood cultures negative.  CT abdomen and pelvis 10 mm hypoattenuating lesion left interpolar left kidney. Recommendation to consider MRI with and without contrast as outpatient. Patient has follow-up with urology but this is not until middle of August.  He's had some fatigue but overall feels close to baseline. No nausea or vomiting. Does have some urine frequency and slight slowness of stream compared to baseline.  His admission hemoglobin was over 15 and at discharge 11.5 which may of been mostly delusional. Potassium 3.4 at discharge  Past Medical History:  Diagnosis Date  . Asthma   . Emphysema of lung ([place])   . Erectile dysfunction   . Hyperlipidemia    No past surgical history on file.  reports that he has been smoking E-cigarettes.  He has a 67.50 pack-year smoking history. He has never used smokeless tobacco. He reports that he does not drink alcohol. His drug history is not on file. family history includes Heart disease (age of onset: 42) in his father; Hypertension in his father. No Known Allergies   Review of Systems  Constitutional: Negative for chills and fever.   Respiratory: Negative for shortness of breath.   Cardiovascular: Negative for chest pain.  Gastrointestinal: Negative for nausea and vomiting.  Genitourinary: Negative for dysuria and flank pain.  Musculoskeletal: Negative for back pain.       Objective:   Physical Exam  Constitutional: He appears well-developed and well-nourished. No distress.  HENT:  Mouth/Throat: Oropharynx is clear and moist.  Cardiovascular: Normal rate and regular rhythm.   Pulmonary/Chest: Effort normal and breath sounds normal. No respiratory distress. He has no wheezes. He has no rales.  Musculoskeletal: He exhibits no edema.       Assessment:     #1 recent acute prostatitis with question of sepsis clinically improved on Levaquin  #2 acute kidney injury with creatinine elevation 1.27 improved following hydration  #3  10 mm hypoattenuating lesion left kidney of uncertain significance  #4 mild hypokalemia at discharge.    Plan:     -Repeat labs with CBC and basic metabolic panel -Finish out Levaquin -Follow-up with urology as scheduled -Schedule MRI with and without contrast to further evaluate left kidney lesion as above -Patient has concerns about possible anxiety with MRI scan. We wrote prescription for Lorazepam 1 mg to take 1 hour prior to procedure. His daughter will be driving him. -Follow-up immediately for any fever, vomiting, diarrhea, or decreased urination  [name] MD [facility]

## 2016-10-06 ENCOUNTER — Other Ambulatory Visit: Payer: Self-pay | Admitting: Family Medicine

## 2016-10-06 DIAGNOSIS — N289 Disorder of kidney and ureter, unspecified: Secondary | ICD-10-CM

## 2016-10-08 ENCOUNTER — Encounter: Payer: Self-pay | Admitting: Family Medicine

## 2016-10-09 NOTE — Telephone Encounter (Signed)
Dr. Elease Hashimoto - Please advise on requested testing. Thanks!

## 2016-10-10 ENCOUNTER — Other Ambulatory Visit: Payer: Self-pay

## 2016-10-10 DIAGNOSIS — A419 Sepsis, unspecified organism: Secondary | ICD-10-CM

## 2016-10-10 DIAGNOSIS — N179 Acute kidney failure, unspecified: Secondary | ICD-10-CM

## 2016-10-11 ENCOUNTER — Other Ambulatory Visit (INDEPENDENT_AMBULATORY_CARE_PROVIDER_SITE_OTHER): Payer: Medicare Other

## 2016-10-11 DIAGNOSIS — A419 Sepsis, unspecified organism: Secondary | ICD-10-CM

## 2016-10-11 DIAGNOSIS — N179 Acute kidney failure, unspecified: Secondary | ICD-10-CM

## 2016-10-11 LAB — CBC WITH DIFFERENTIAL/PLATELET
Basophils Absolute: 0.1 10*3/uL (ref 0.0–0.1)
Basophils Relative: 1.3 % (ref 0.0–3.0)
EOS ABS: 0.2 10*3/uL (ref 0.0–0.7)
Eosinophils Relative: 1.9 % (ref 0.0–5.0)
HCT: 40.8 % (ref 39.0–52.0)
Hemoglobin: 13.7 g/dL (ref 13.0–17.0)
LYMPHS ABS: 2.2 10*3/uL (ref 0.7–4.0)
Lymphocytes Relative: 26.3 % (ref 12.0–46.0)
MCHC: 33.5 g/dL (ref 30.0–36.0)
MCV: 92.6 fl (ref 78.0–100.0)
MONO ABS: 0.8 10*3/uL (ref 0.1–1.0)
Monocytes Relative: 9.7 % (ref 3.0–12.0)
NEUTROS PCT: 60.8 % (ref 43.0–77.0)
Neutro Abs: 5 10*3/uL (ref 1.4–7.7)
Platelets: 277 10*3/uL (ref 150.0–400.0)
RBC: 4.41 Mil/uL (ref 4.22–5.81)
RDW: 14.2 % (ref 11.5–15.5)
WBC: 8.2 10*3/uL (ref 4.0–10.5)

## 2016-10-11 LAB — BASIC METABOLIC PANEL
BUN: 13 mg/dL (ref 6–23)
CALCIUM: 9.2 mg/dL (ref 8.4–10.5)
CO2: 30 meq/L (ref 19–32)
Chloride: 104 mEq/L (ref 96–112)
Creatinine, Ser: 1.06 mg/dL (ref 0.40–1.50)
GFR: 73.99 mL/min (ref >60.00)
Glucose, Bld: 117 mg/dL — ABNORMAL HIGH (ref 70–99)
Potassium: 4.4 mEq/L (ref 3.5–5.1)
SODIUM: 138 meq/L (ref 135–145)

## 2016-10-18 ENCOUNTER — Other Ambulatory Visit: Payer: Self-pay | Admitting: Family Medicine

## 2016-10-18 ENCOUNTER — Ambulatory Visit
Admission: RE | Admit: 2016-10-18 | Discharge: 2016-10-18 | Disposition: A | Discharge status: Home / Self Care | Payer: Medicare Other | Source: Ambulatory Visit | Attending: Family Medicine | Admitting: Family Medicine

## 2016-10-18 DIAGNOSIS — N289 Disorder of kidney and ureter, unspecified: Secondary | ICD-10-CM

## 2016-10-18 DIAGNOSIS — Z01818 Encounter for other preprocedural examination: Secondary | ICD-10-CM | POA: Diagnosis not present

## 2016-10-19 ENCOUNTER — Encounter: Payer: Self-pay | Admitting: Family Medicine

## 2016-10-19 ENCOUNTER — Inpatient Hospital Stay: Admission: RE | Admit: 2016-10-19 | Payer: Medicare Other | Source: Ambulatory Visit

## 2016-10-19 ENCOUNTER — Ambulatory Visit
Admission: RE | Admit: 2016-10-19 | Discharge: 2016-10-19 | Disposition: A | Discharge status: Home / Self Care | Payer: Medicare Other | Source: Ambulatory Visit | Attending: Family Medicine | Admitting: Family Medicine

## 2016-10-19 DIAGNOSIS — N2889 Other specified disorders of kidney and ureter: Secondary | ICD-10-CM | POA: Diagnosis not present

## 2016-10-19 DIAGNOSIS — N289 Disorder of kidney and ureter, unspecified: Secondary | ICD-10-CM

## 2016-10-19 MED ORDER — GADOBENATE DIMEGLUMINE 529 MG/ML IV SOLN
18.0000 mL | Freq: Once | INTRAVENOUS | Status: AC | PRN
  Administered 2016-10-19: 18 mL via INTRAVENOUS

## 2016-11-06 ENCOUNTER — Other Ambulatory Visit: Payer: Medicare Other

## 2016-11-09 DIAGNOSIS — N281 Cyst of kidney, acquired: Secondary | ICD-10-CM | POA: Diagnosis not present

## 2016-11-09 DIAGNOSIS — N410 Acute prostatitis: Secondary | ICD-10-CM | POA: Diagnosis not present

## 2016-11-17 ENCOUNTER — Encounter: Payer: Self-pay | Admitting: Family Medicine

## 2016-11-17 ENCOUNTER — Other Ambulatory Visit: Payer: Self-pay | Admitting: Family Medicine

## 2016-11-17 ENCOUNTER — Ambulatory Visit (INDEPENDENT_AMBULATORY_CARE_PROVIDER_SITE_OTHER): Payer: Medicare Other | Admitting: Family Medicine

## 2016-11-17 VITALS — BP 136/78 | HR 74 | Temp 98.2°F | Ht 64.0 in | Wt 173.9 lb

## 2016-11-17 DIAGNOSIS — Z0000 Encounter for general adult medical examination without abnormal findings: Secondary | ICD-10-CM | POA: Diagnosis not present

## 2016-11-17 DIAGNOSIS — F17210 Nicotine dependence, cigarettes, uncomplicated: Secondary | ICD-10-CM

## 2016-11-17 LAB — HEPATIC FUNCTION PANEL
ALK PHOS: 61 U/L (ref 39–117)
ALT: 14 U/L (ref 0–53)
AST: 15 U/L (ref 0–37)
Albumin: 4.2 g/dL (ref 3.5–5.2)
BILIRUBIN DIRECT: 0.2 mg/dL (ref 0.0–0.3)
TOTAL PROTEIN: 7 g/dL (ref 6.0–8.3)
Total Bilirubin: 0.7 mg/dL (ref 0.2–1.2)

## 2016-11-17 LAB — BASIC METABOLIC PANEL
BUN: 12 mg/dL (ref 6–23)
CALCIUM: 9.7 mg/dL (ref 8.4–10.5)
CO2: 31 meq/L (ref 19–32)
CREATININE: 0.96 mg/dL (ref 0.40–1.50)
Chloride: 104 mEq/L (ref 96–112)
GFR: 82.93 mL/min (ref >60.00)
GLUCOSE: 104 mg/dL — AB (ref 70–99)
Potassium: 4.1 mEq/L (ref 3.5–5.1)
SODIUM: 141 meq/L (ref 135–145)

## 2016-11-17 LAB — LIPID PANEL
CHOL/HDL RATIO: 4
Cholesterol: 125 mg/dL (ref 0–200)
HDL: 35.1 mg/dL — ABNORMAL LOW (ref >39.00)
LDL Cholesterol: 61 mg/dL (ref 0–99)
NONHDL: 90.26
Triglycerides: 148 mg/dL (ref 0.0–149.0)
VLDL: 29.6 mg/dL (ref 0.0–40.0)

## 2016-11-17 LAB — CBC WITH DIFFERENTIAL/PLATELET
BASOS ABS: 0.1 10*3/uL (ref 0.0–0.1)
Basophils Relative: 1 % (ref 0.0–3.0)
EOS PCT: 2.3 % (ref 0.0–5.0)
Eosinophils Absolute: 0.2 10*3/uL (ref 0.0–0.7)
HEMATOCRIT: 43.4 % (ref 39.0–52.0)
HEMOGLOBIN: 14.3 g/dL (ref 13.0–17.0)
LYMPHS ABS: 1.9 10*3/uL (ref 0.7–4.0)
LYMPHS PCT: 26.3 % (ref 12.0–46.0)
MCHC: 32.8 g/dL (ref 30.0–36.0)
MCV: 93.9 fl (ref 78.0–100.0)
MONOS PCT: 10.1 % (ref 3.0–12.0)
Monocytes Absolute: 0.7 10*3/uL (ref 0.1–1.0)
NEUTROS PCT: 60.3 % (ref 43.0–77.0)
Neutro Abs: 4.5 10*3/uL (ref 1.4–7.7)
Platelets: 248 10*3/uL (ref 150.0–400.0)
RBC: 4.63 Mil/uL (ref 4.22–5.81)
RDW: 13.8 % (ref 11.5–15.5)
WBC: 7.4 10*3/uL (ref 4.0–10.5)

## 2016-11-17 LAB — PSA: PSA: 4.82 ng/mL — ABNORMAL HIGH (ref 0.10–4.00)

## 2016-11-17 LAB — TSH: TSH: 1.41 u[IU]/mL (ref 0.35–4.50)

## 2016-11-17 NOTE — Progress Notes (Signed)
Subjective:     Patient ID: Martin Navarro, male   DOB: [birth year], 66 y.o.   MRN: [id number]  HPI Patient seen for physical exam. He had severe infection with prostatitis early in the summer is recovered from that. No recurrent dysuria or fever. His chronic problems include history of hyperlipidemia and nicotine use. He is currently using electronic cigarettes. He quit smoking regular cigarettes about 2 years ago. He has over 40 pack year history. He does express some interest and CT lung cancer screening. No history of hepatitis C screening. No specific risk factors. Remains on Crestor for hyperlipidemia. He takes omeprazole for GERD and does symptoms are well controlled.  He's had previous shingles vaccine. Pneumonia vaccines are up-to-date. He declines flu vaccine today.  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 Asthma   . Emphysema of lung ([place])   . Erectile dysfunction   . Hyperlipidemia    No past surgical history on file.  reports that he has been smoking E-cigarettes.  He has a 67.50 pack-year smoking history. He has never used smokeless tobacco. He reports that he does not drink alcohol. His drug history is not on file. family history includes Heart disease (age of onset: 34) in his father; Hypertension in his father. No Known Allergies   Review of Systems  Constitutional: Negative for activity change, appetite change, fatigue and fever.  HENT: Negative for congestion, ear pain and trouble swallowing.   Eyes: Negative for pain and visual disturbance.  Respiratory: Negative for chest tightness, shortness of breath and wheezing.   Cardiovascular: Negative for chest pain and palpitations.  Gastrointestinal: Negative for abdominal distention, abdominal pain, blood in stool, constipation, diarrhea, nausea, rectal pain and vomiting.  Genitourinary: Negative for dysuria, hematuria and testicular pain.   Musculoskeletal: Negative for arthralgias and joint swelling.  Skin: Negative for rash.  Neurological: Negative for dizziness, syncope and headaches.  Hematological: Negative for adenopathy.  Psychiatric/Behavioral: Negative for confusion and dysphoric mood.       Objective:   Physical Exam  Constitutional: He is oriented to person, place, and time. He appears well-developed and well-nourished. No distress.  HENT:  Head: Normocephalic and atraumatic.  Right Ear: External ear normal.  Left Ear: External ear normal.  Mouth/Throat: Oropharynx is clear and moist.  Eyes: Pupils are equal, round, and reactive to light. Conjunctivae and EOM are normal.  Neck: Normal range of motion. Neck supple. No thyromegaly present.  Cardiovascular: Normal rate, regular rhythm and normal heart sounds.   No murmur heard. Pulmonary/Chest: No respiratory distress. He has no wheezes. He has no rales.  Abdominal: Soft. Bowel sounds are normal. He exhibits no distension and no mass. There is no tenderness. There is no rebound and no guarding.  Musculoskeletal: He exhibits no edema.  Lymphadenopathy:    He has no cervical adenopathy.  Neurological: He is alert and oriented to person, place, and time. He displays normal reflexes. No cranial nerve deficit.  Skin: No rash noted.  Psychiatric: He has a normal mood and affect.       Assessment:     Physical exam. Patient has long-standing history of nicotine use. His hyperlipidemia on Crestor. Controlled GERD    Plan:     -recommend flu vaccine and he wishes to wait -Check lab work. Patient requests PSA we discussed pros and cons of PSA screening -Set up referral for consideration of low-dose CT  lung cancer screening -Has already had previous abdominal aortic aneurysm screening which was negative  [name] MD [facility]

## 2016-11-17 NOTE — Patient Instructions (Addendum)
I recommend you consider low dose CT lung cancer screen- we will set up. Consider flu shot this Fall.

## 2016-11-18 LAB — HEPATITIS C ANTIBODY: HCV Ab: NONREACTIVE

## 2016-11-21 ENCOUNTER — Other Ambulatory Visit: Payer: Self-pay | Admitting: Acute Care

## 2016-11-21 DIAGNOSIS — F17210 Nicotine dependence, cigarettes, uncomplicated: Secondary | ICD-10-CM

## 2016-11-21 DIAGNOSIS — Z122 Encounter for screening for malignant neoplasm of respiratory organs: Secondary | ICD-10-CM

## 2016-12-06 ENCOUNTER — Ambulatory Visit (INDEPENDENT_AMBULATORY_CARE_PROVIDER_SITE_OTHER)
Admission: RE | Admit: 2016-12-06 | Discharge: 2016-12-06 | Disposition: A | Discharge status: Home / Self Care | Payer: Medicare Other | Source: Ambulatory Visit | Attending: Acute Care | Admitting: Acute Care

## 2016-12-06 ENCOUNTER — Encounter: Payer: Self-pay | Admitting: Acute Care

## 2016-12-06 ENCOUNTER — Ambulatory Visit (INDEPENDENT_AMBULATORY_CARE_PROVIDER_SITE_OTHER): Payer: Medicare Other | Admitting: Acute Care

## 2016-12-06 DIAGNOSIS — Z122 Encounter for screening for malignant neoplasm of respiratory organs: Secondary | ICD-10-CM

## 2016-12-06 DIAGNOSIS — Z87891 Personal history of nicotine dependence: Secondary | ICD-10-CM

## 2016-12-06 DIAGNOSIS — F17210 Nicotine dependence, cigarettes, uncomplicated: Secondary | ICD-10-CM | POA: Diagnosis not present

## 2016-12-06 NOTE — Progress Notes (Signed)
Shared Decision Making Visit Lung Cancer Screening Program [phone])   Eligibility:  Age 66 y.o.  Pack Years Smoking History Calculation 106 pack years (# packs/per year x # years smoked)  Recent History of coughing up blood  no  Unexplained weight loss? no ( >Than 15 pounds within the last 6 months )  Prior History Lung / other cancer no (Diagnosis within the last 5 years already requiring surveillance chest CT Scans).  Smoking Status Former Smoker ( Tobacco)  Former Smokers: Years since quit: 2 years  Quit Date: 2016  Still smokes vapor cigarettes  Visit Components:  Discussion included one or more decision making aids. yes  Discussion included risk/benefits of screening. yes  Discussion included potential follow up diagnostic testing for abnormal scans. yes  Discussion included meaning and risk of over diagnosis. yes  Discussion included meaning and risk of False Positives. yes  Discussion included meaning of total radiation exposure. yes  Counseling Included:  Importance of adherence to annual lung cancer LDCT screening. yes  Impact of comorbidities on ability to participate in the program. yes  Ability and willingness to under diagnostic treatment. yes  Smoking Cessation Counseling:  Current Smokers:   Discussed importance of smoking cessation. yes  Information about tobacco cessation classes and interventions provided to patient. yes  Patient provided with "ticket" for LDCT Scan. yes  Symptomatic Patient. no  Counseling  Diagnosis Code: Tobacco Use Z72.0  Asymptomatic Patient yes  Counseling (Intermediate counseling: > three minutes counseling) F0263  Former Smokers:   Discussed the importance of maintaining cigarette abstinence. yes  Diagnosis Code: Personal History of Nicotine Dependence. Z85.885  Information about tobacco cessation classes and interventions provided to patient. Yes  Patient provided with "ticket" for LDCT Scan.  yes  Written Order for Lung Cancer Screening with LDCT placed in Epic. Yes (CT Chest Lung Cancer Screening Low Dose W/O CM) [id number] Z12.2-Screening of respiratory organs Z87.891-Personal history of nicotine dependence   I have spent 25 minutes of face to face time with Martin Navarro and his daughter  discussing the risks and benefits of lung cancer screening. We viewed a power point together that explained in detail the above noted topics. We paused at intervals to allow for questions to be asked and answered to ensure understanding.We discussed that the single most powerful action that he  can take to decrease his  risk of developing lung cancer is to quit smoking. We discussed whether or not he  is ready to commit to setting a quit date.He quit smoking tobacco cigarettes in 2016, but is currently smoking vapor cigarettes. I explained that we do not have enough data to let us know the effects of vapor cigarettes on the lungs at present. I have encouraged him to also quit the vaping. We discussed options for tools to aid in quitting smoking including nicotine replacement therapy, non-nicotine medications, support groups, Quit Smart classes, and behavior modification. We discussed that often times setting smaller, more achievable goals, such as eliminating 1 cigarette a day for a week and then 2 cigarettes a day for a week can be helpful in slowly decreasing the number of cigarettes smoked. This allows for a sense of accomplishment as well as providing a clinical benefit. I gave Martin Navarro the " Be Stronger Than Your Excuses" card with contact information for community resources, classes, free nicotine replacement therapy, and access to mobile apps, text messaging, and on-line smoking cessation help. I have also given Martin Navarro my card and contact information  in the event he needs to contact me. We discussed the time and location of the scan, and that either Doroteo Glassman RN or I will call with the results  within 24-48 hours of receiving them. I have offered him  a copy of the power point we viewed  as a resource in the event they need reinforcement of the concepts we discussed today in the office. The patient verbalized understanding of all of  the above and had no further questions upon leaving the office. They have my contact information in the event they have any further questions.  I spent 3-4 minutes counseling on smoking cessation and the health risks of continued tobacco abuse.  I explained to the patient that there has been a high incidence of coronary artery disease noted on these exams. I explained that this is a non-gated exam therefore degree or severity cannot be determined. This patient is currently on statin therapy. I have asked the patient to follow-up with their PCP regarding any incidental finding of coronary artery disease and management with diet or medication as their PCP  feels is clinically indicated. The patient verbalized understanding of the above and had no further questions upon completion of the visit.     Magdalen Spatz, NP 12/06/2016

## 2016-12-07 ENCOUNTER — Telehealth: Payer: Self-pay | Admitting: Acute Care

## 2016-12-07 DIAGNOSIS — Z122 Encounter for screening for malignant neoplasm of respiratory organs: Secondary | ICD-10-CM

## 2016-12-07 DIAGNOSIS — Z87891 Personal history of nicotine dependence: Secondary | ICD-10-CM

## 2016-12-07 NOTE — Telephone Encounter (Signed)
Pt'daughter informed of CT results per Eric Form, NP.  PT verbalized understanding.  Copy sent to PCP and Dr Wynonia Lawman. Order placed for 1 yr f/u CT.

## 2016-12-14 ENCOUNTER — Encounter: Payer: Self-pay | Admitting: Family Medicine

## 2016-12-18 DIAGNOSIS — D1801 Hemangioma of skin and subcutaneous tissue: Secondary | ICD-10-CM | POA: Diagnosis not present

## 2016-12-18 DIAGNOSIS — D225 Melanocytic nevi of trunk: Secondary | ICD-10-CM | POA: Diagnosis not present

## 2016-12-18 DIAGNOSIS — L814 Other melanin hyperpigmentation: Secondary | ICD-10-CM | POA: Diagnosis not present

## 2016-12-18 DIAGNOSIS — D485 Neoplasm of uncertain behavior of skin: Secondary | ICD-10-CM | POA: Diagnosis not present

## 2016-12-18 DIAGNOSIS — L821 Other seborrheic keratosis: Secondary | ICD-10-CM | POA: Diagnosis not present

## 2016-12-18 DIAGNOSIS — L570 Actinic keratosis: Secondary | ICD-10-CM | POA: Diagnosis not present

## 2017-01-08 ENCOUNTER — Other Ambulatory Visit: Payer: Self-pay | Admitting: Family Medicine

## 2017-01-26 ENCOUNTER — Encounter: Payer: Self-pay | Admitting: Adult Health

## 2017-01-26 ENCOUNTER — Ambulatory Visit (INDEPENDENT_AMBULATORY_CARE_PROVIDER_SITE_OTHER): Payer: Medicare Other | Admitting: Adult Health

## 2017-01-26 VITALS — BP 120/80 | HR 80 | Temp 99.2°F | Wt 173.2 lb

## 2017-01-26 DIAGNOSIS — N451 Epididymitis: Secondary | ICD-10-CM | POA: Diagnosis not present

## 2017-01-26 MED ORDER — LEVOFLOXACIN 500 MG PO TABS: 500.0000 mg | ORAL_TABLET | Freq: Every day | ORAL | 0 refills | Status: DC

## 2017-01-26 NOTE — Progress Notes (Signed)
Subjective:    Patient ID: Martin Navarro, male    DOB: 1950/10/10, 66 y.o.   MRN: [id number]  HPI  66 year old male who  has a past medical history of Asthma; Emphysema of lung ([place]); Erectile dysfunction; and Hyperlipidemia. He is a patient of Dr. Elease Hashimoto, who I am seeing today for an acute complaint of abdominal pain with radiating pain to the right testicles. He has a history of prostatitis in June for which he was hospitalized. He feels as though the pain is the same. His pain has been present for 2-3 days and is described as " tender". He has been using nsaids which help with the pain slightly.   He denies any fever, pain with bowel movements, n/v/d. He does not feel acutely ill.    Review of Systems See HPI   Past Medical History:  Diagnosis Date  . Asthma   . Emphysema of lung ([place])   . Erectile dysfunction   . Hyperlipidemia     Social History   Social History  . Marital status: Divorced    Spouse name: N/A  . Number of children: N/A  . Years of education: N/A   Occupational History  . Not on file.   Social History Main Topics  . Smoking status: Current Every Day Smoker    Packs/day: 2.00    Years: 53.00    Types: E-cigarettes  . Smokeless tobacco: Never Used     Comment: Pt now vapes, quit tobacco 2016  . Alcohol use No  . Drug use: Unknown  . Sexual activity: Not on file   Other Topics Concern  . Not on file   Social History Narrative  . No narrative on file    No past surgical history on file.  Family History  Problem Relation Age of Onset  . Hypertension Father   . Heart disease Father 47    No Known Allergies  Current Outpatient Prescriptions on File Prior to Visit  Medication Sig Dispense Refill  . aspirin 81 MG tablet Take 81 mg by mouth daily.      Marland Kitchen LORazepam (ATIVAN) 1 MG tablet Take one tablet one hour prior to procedure. 2 tablet 0  . Omega-3 Fatty Acids (FISH OIL) 1200 MG CAPS Take 1,200 mg by mouth daily.    Marland Kitchen omeprazole  (PRILOSEC) 40 MG capsule TAKE 1 CAPSULE BY MOUTH EVERY DAY 90 capsule 1  . rosuvastatin (CRESTOR) 10 MG tablet TAKE 1 TABLET BY MOUTH EVERY DAY 90 tablet 1  . vitamin B-12 (CYANOCOBALAMIN) 1000 MCG tablet Take 1,000-5,000 mcg by mouth daily.     . vitamin E 400 UNIT capsule Take 1,200 Units by mouth daily.     No current facility-administered medications on file prior to visit.     BP 120/80 (BP Location: Left Arm, Patient Position: Sitting, Cuff Size: Normal)   Pulse 80   Temp 99.2 F (37.3 C) (Oral)   Wt 173 lb 3.2 oz (78.6 kg)   SpO2 98%   BMI 29.73 kg/m       Objective:   Physical Exam  Constitutional: He is oriented to person, place, and time. He appears well-developed and well-nourished.  Cardiovascular: Normal rate, regular rhythm, normal heart sounds and intact distal pulses.  Exam reveals no gallop.   No murmur heard. Pulmonary/Chest: Breath sounds normal. No respiratory distress. He has no wheezes. He has no rales. He exhibits no tenderness.  Abdominal: Hernia confirmed negative in the right inguinal area  and confirmed negative in the left inguinal area.  Genitourinary: Prostate normal. Rectal exam shows anal tone normal. Prostate is not enlarged and not tender. Cremasteric reflex is present. Right testis shows swelling and tenderness. Right testis shows no mass. Left testis shows no mass, no swelling and no tenderness. Left testis is descended. Cremasteric reflex is not absent on the left side.  Genitourinary Comments: epididymitis swollen and tender. No torsion noted. No overt swelling to scrotum   Neurological: He is alert and oriented to person, place, and time.  Skin: Skin is warm and dry. No rash noted. He is not diaphoretic. No erythema. No pallor.  Psychiatric: He has a normal mood and affect. His behavior is normal. Judgment normal.  Nursing note and vitals reviewed.     Assessment & Plan:  1. Epididymitis - Continue with OTC Nsaids as needed - levofloxacin  (LEVAQUIN) 500 MG tablet; Take 1 tablet (500 mg total) by mouth daily.  Dispense: 10 tablet; Refill: 0 - Follow up if no improvement in the next 3-5 days or sooner if needed - Go to the ER over the weekend if fever develops or pain becomes worse   Dorothyann Peng, NP

## 2017-01-28 ENCOUNTER — Inpatient Hospital Stay (HOSPITAL_COMMUNITY)
Admission: EM | Admit: 2017-01-28 | Discharge: 2017-01-31 | DRG: 728 | Disposition: A | Discharge status: Home / Self Care | Payer: Medicare Other | Attending: Internal Medicine | Admitting: Internal Medicine

## 2017-01-28 ENCOUNTER — Emergency Department (HOSPITAL_COMMUNITY): Payer: Medicare Other

## 2017-01-28 ENCOUNTER — Encounter (HOSPITAL_COMMUNITY): Payer: Self-pay | Admitting: Emergency Medicine

## 2017-01-28 DIAGNOSIS — J45909 Unspecified asthma, uncomplicated: Secondary | ICD-10-CM | POA: Diagnosis not present

## 2017-01-28 DIAGNOSIS — I10 Essential (primary) hypertension: Secondary | ICD-10-CM | POA: Diagnosis not present

## 2017-01-28 DIAGNOSIS — N453 Epididymo-orchitis: Secondary | ICD-10-CM

## 2017-01-28 DIAGNOSIS — N5089 Other specified disorders of the male genital organs: Secondary | ICD-10-CM | POA: Diagnosis not present

## 2017-01-28 DIAGNOSIS — N451 Epididymitis: Secondary | ICD-10-CM | POA: Diagnosis not present

## 2017-01-28 DIAGNOSIS — E785 Hyperlipidemia, unspecified: Secondary | ICD-10-CM | POA: Diagnosis present

## 2017-01-28 DIAGNOSIS — Z7982 Long term (current) use of aspirin: Secondary | ICD-10-CM

## 2017-01-28 DIAGNOSIS — J439 Emphysema, unspecified: Secondary | ICD-10-CM | POA: Diagnosis not present

## 2017-01-28 DIAGNOSIS — N503 Cyst of epididymis: Secondary | ICD-10-CM | POA: Diagnosis not present

## 2017-01-28 DIAGNOSIS — D72829 Elevated white blood cell count, unspecified: Secondary | ICD-10-CM | POA: Diagnosis present

## 2017-01-28 DIAGNOSIS — F17290 Nicotine dependence, other tobacco product, uncomplicated: Secondary | ICD-10-CM | POA: Diagnosis present

## 2017-01-28 DIAGNOSIS — K219 Gastro-esophageal reflux disease without esophagitis: Secondary | ICD-10-CM | POA: Diagnosis present

## 2017-01-28 DIAGNOSIS — N50811 Right testicular pain: Secondary | ICD-10-CM | POA: Diagnosis not present

## 2017-01-28 DIAGNOSIS — Z79899 Other long term (current) drug therapy: Secondary | ICD-10-CM | POA: Diagnosis not present

## 2017-01-28 HISTORY — DX: Epididymo-orchitis: N45.3

## 2017-01-28 HISTORY — DX: Inflammatory disease of prostate, unspecified: N41.9

## 2017-01-28 LAB — CBC WITH DIFFERENTIAL/PLATELET
BASOS PCT: 0 %
Basophils Absolute: 0 10*3/uL (ref 0.0–0.1)
EOS ABS: 0.1 10*3/uL (ref 0.0–0.7)
Eosinophils Relative: 0 %
HCT: 42.8 % (ref 39.0–52.0)
HEMOGLOBIN: 14.4 g/dL (ref 13.0–17.0)
LYMPHS PCT: 9 %
Lymphs Abs: 1.3 10*3/uL (ref 0.7–4.0)
MCH: 31 pg (ref 26.0–34.0)
MCHC: 33.6 g/dL (ref 30.0–36.0)
MCV: 92 fL (ref 78.0–100.0)
MONOS PCT: 10 %
Monocytes Absolute: 1.4 10*3/uL — ABNORMAL HIGH (ref 0.1–1.0)
NEUTROS ABS: 11.1 10*3/uL — AB (ref 1.7–7.7)
NEUTROS PCT: 81 %
PLATELETS: 187 10*3/uL (ref 150–400)
RBC: 4.65 MIL/uL (ref 4.22–5.81)
RDW: 13.7 % (ref 11.5–15.5)
WBC: 13.9 10*3/uL — ABNORMAL HIGH (ref 4.0–10.5)

## 2017-01-28 LAB — BASIC METABOLIC PANEL
Anion gap: 8 (ref 5–15)
BUN: 12 mg/dL (ref 6–20)
CALCIUM: 9.1 mg/dL (ref 8.9–10.3)
CHLORIDE: 103 mmol/L (ref 101–111)
CO2: 24 mmol/L (ref 22–32)
CREATININE: 1.05 mg/dL (ref 0.61–1.24)
Glucose, Bld: 115 mg/dL — ABNORMAL HIGH (ref 65–99)
Potassium: 4.1 mmol/L (ref 3.5–5.1)
SODIUM: 135 mmol/L (ref 135–145)

## 2017-01-28 LAB — URINALYSIS, ROUTINE W REFLEX MICROSCOPIC
Bilirubin Urine: NEGATIVE
Glucose, UA: NEGATIVE mg/dL
Ketones, ur: 5 mg/dL — AB
NITRITE: POSITIVE — AB
PROTEIN: 30 mg/dL — AB
SPECIFIC GRAVITY, URINE: 1.023 (ref 1.005–1.030)
pH: 5 (ref 5.0–8.0)

## 2017-01-28 MED ORDER — SODIUM CHLORIDE 0.9% FLUSH: 3.0000 mL | INTRAVENOUS | Status: DC | PRN

## 2017-01-28 MED ORDER — SODIUM CHLORIDE 0.9% FLUSH
3.0000 mL | Freq: Two times a day (BID) | INTRAVENOUS | Status: DC
  Administered 2017-01-28 – 2017-01-30 (×3): 3 mL via INTRAVENOUS

## 2017-01-28 MED ORDER — HYDROCODONE-ACETAMINOPHEN 5-325 MG PO TABS
1.0000 | ORAL_TABLET | ORAL | Status: DC | PRN
  Filled 2017-01-28: qty 2
  Filled 2017-01-28: qty 1

## 2017-01-28 MED ORDER — DEXTROSE 5 % IV SOLN
2.0000 mg/kg | Freq: Once | INTRAVENOUS | Status: AC
  Administered 2017-01-28: 120 mg via INTRAVENOUS
  Filled 2017-01-28: qty 3

## 2017-01-28 MED ORDER — ROSUVASTATIN CALCIUM 10 MG PO TABS
10.0000 mg | ORAL_TABLET | Freq: Every day | ORAL | Status: DC
  Administered 2017-01-28 – 2017-01-31 (×4): 10 mg via ORAL
  Filled 2017-01-28 (×4): qty 1

## 2017-01-28 MED ORDER — ACETAMINOPHEN 650 MG RE SUPP: 650.0000 mg | Freq: Four times a day (QID) | RECTAL | Status: DC | PRN

## 2017-01-28 MED ORDER — PANTOPRAZOLE SODIUM 40 MG PO TBEC
40.0000 mg | DELAYED_RELEASE_TABLET | Freq: Every day | ORAL | Status: DC
  Administered 2017-01-28 – 2017-01-31 (×4): 40 mg via ORAL
  Filled 2017-01-28 (×4): qty 1

## 2017-01-28 MED ORDER — DEXTROSE 5 % IV SOLN
1.0000 g | Freq: Three times a day (TID) | INTRAVENOUS | Status: DC
  Administered 2017-01-28 – 2017-01-31 (×8): 1 g via INTRAVENOUS
  Filled 2017-01-28 (×10): qty 1

## 2017-01-28 MED ORDER — ENOXAPARIN SODIUM 40 MG/0.4ML ~~LOC~~ SOLN
40.0000 mg | SUBCUTANEOUS | Status: DC
  Administered 2017-01-28: 40 mg via SUBCUTANEOUS
  Filled 2017-01-28 (×3): qty 0.4

## 2017-01-28 MED ORDER — SODIUM CHLORIDE 0.9 % IV SOLN
INTRAVENOUS | Status: DC
  Administered 2017-01-28 – 2017-01-31 (×6): via INTRAVENOUS

## 2017-01-28 MED ORDER — DOXYCYCLINE HYCLATE 100 MG IV SOLR
100.0000 mg | Freq: Two times a day (BID) | INTRAVENOUS | Status: DC
  Administered 2017-01-28 – 2017-01-31 (×6): 100 mg via INTRAVENOUS
  Filled 2017-01-28 (×6): qty 100

## 2017-01-28 MED ORDER — VITAMIN B-12 1000 MCG PO TABS
1000.0000 ug | ORAL_TABLET | Freq: Every day | ORAL | Status: DC
Start: 2017-01-28 — End: 2017-01-29
  Administered 2017-01-28: 1000 ug via ORAL
  Filled 2017-01-28 (×2): qty 1

## 2017-01-28 MED ORDER — VITAMIN E 180 MG (400 UNIT) PO CAPS
1200.0000 [IU] | ORAL_CAPSULE | Freq: Every day | ORAL | Status: DC
  Administered 2017-01-28 – 2017-01-31 (×4): 1200 [IU] via ORAL
  Filled 2017-01-28 (×4): qty 3

## 2017-01-28 MED ORDER — TRAZODONE HCL 50 MG PO TABS
50.0000 mg | ORAL_TABLET | Freq: Every day | ORAL | Status: DC
  Administered 2017-01-28 – 2017-01-30 (×3): 50 mg via ORAL
  Filled 2017-01-28 (×4): qty 1

## 2017-01-28 MED ORDER — ACETAMINOPHEN 325 MG PO TABS
650.0000 mg | ORAL_TABLET | Freq: Four times a day (QID) | ORAL | Status: DC | PRN
  Administered 2017-01-29 – 2017-01-30 (×2): 650 mg via ORAL
  Filled 2017-01-28 (×2): qty 2

## 2017-01-28 MED ORDER — ASPIRIN 81 MG PO CHEW
81.0000 mg | CHEWABLE_TABLET | Freq: Every day | ORAL | Status: DC
  Administered 2017-01-28 – 2017-01-31 (×4): 81 mg via ORAL
  Filled 2017-01-28 (×4): qty 1

## 2017-01-28 MED ORDER — SODIUM CHLORIDE 0.9 % IV SOLN: 250.0000 mL | INTRAVENOUS | Status: DC | PRN

## 2017-01-28 MED ORDER — DEXTROSE 5 % IV SOLN
1.0000 g | Freq: Once | INTRAVENOUS | Status: AC
  Administered 2017-01-28: 1 g via INTRAVENOUS
  Filled 2017-01-28: qty 10

## 2017-01-28 NOTE — ED Triage Notes (Signed)
Daughter stated, He has right swollen testicle , went to doctor on Friday given medicine but the pain is worse, he has had prostitis before and has ended up in hospital.

## 2017-01-28 NOTE — H&P (Signed)
History and Physical  [name]:[id number] DOB: November 05, 1950 DOA: 01/28/2017  Referring physician: ER PCP: [name], MD  Outpatient Specialists: None Patient coming from: home & is able to ambulate independently  Chief Complaint: Right testicular pain and swelling   HPI: Martin Navarro is a 66 y.o. male with medical history significant for asthma, hyperlipidemia, GERD, prostatitis, presents to the ED complaining of right testicular pain and swelling for the past 4 days.  Patient initially saw his PCP on 11/2 for similar complaints and was discharged on p.o. Levaquin.  Patient continued to have more pain and significant swelling with associated fever/chills, despite taking p.o. Levaquin.  Of note, patient was diagnosed with prostatitis in June 2018 under the urology service.  Patient denied any urethral discharge, dysuria, chest pain, abdominal pain, nausea/vomiting.  Patient admitted for further management, consult to urology  ED Course: In the ED, ultrasound of the scrotum showed epididymo-orchitis.  Patient remained afebrile but with leukocytosis.  U/A positive. Vital signs remained stable.  Urology resident saw the patient and recommended IV antibiotics.  Review of Systems: Review of systems are otherwise negative.   Past Medical History:  Diagnosis Date  . Asthma   . Emphysema of lung ([place])   . Erectile dysfunction   . Hyperlipidemia   . Prostatitis    History reviewed. No pertinent surgical history.  Social History:  reports that he has been smoking e-cigarettes.  He has a 106.00 pack-year smoking history. he has never used smokeless tobacco. He reports that he does not drink alcohol. His drug history is not on file.   No Known Allergies  Family History  Problem Relation Age of Onset  . Hypertension Father   . Heart disease Father 92      Prior to Admission medications   Medication Sig Start Date End Date Taking? Authorizing Provider  aspirin 81 MG  tablet Take 81 mg by mouth daily.     Yes [provider]  Omega-3 Fatty Acids (FISH OIL) 1200 MG CAPS Take 1,200 mg by mouth daily.   Yes [provider]  omeprazole (PRILOSEC) 40 MG capsule TAKE 1 CAPSULE BY MOUTH EVERY DAY 01/08/17  Yes Burchette, Alinda Sierras, MD  oxymetazoline (AFRIN) 0.05 % nasal spray Place 2 sprays 2 (two) times daily as needed into both nostrils for congestion.   Yes [provider]  rosuvastatin (CRESTOR) 10 MG tablet TAKE 1 TABLET BY MOUTH EVERY DAY 01/08/17  Yes Burchette, Alinda Sierras, MD  vitamin B-12 (CYANOCOBALAMIN) 1000 MCG tablet Take 1,000-5,000 mcg by mouth daily.    Yes [provider]  vitamin E 400 UNIT capsule Take 1,200 Units by mouth daily.   Yes [provider]    Physical Exam: BP (!) 141/85   Pulse 76   Temp 98.3 F (36.8 C) (Oral)   Resp 12   Ht 5\' 5"  (1.651 m)   Wt 78.5 kg (173 lb)   SpO2 100%   BMI 28.79 kg/m   General: Awake, alert, oriented x3 Eyes: Normal ENT: Normal Neck: Normal Cardiovascular: S1-S2 present, no added sounds Respiratory: Chest clear bilaterally Abdomen: Soft, nontender, nondistended GU: Right testicle swollen, mildly tender to touch Musculoskeletal: No pedal edema Psychiatric: Normal mood Neurologic: No focal neurologic deficit          Labs on Admission:  Basic Metabolic Panel: Recent Labs  Lab 01/28/17 1018  NA 135  K 4.1  CL 103  CO2 24  GLUCOSE 115*  BUN 12  CREATININE 1.05  CALCIUM 9.1   Liver Function Tests: No results for input(s): AST, ALT, ALKPHOS, BILITOT, PROT, ALBUMIN in the last 168 hours. No results for input(s): LIPASE, AMYLASE in the last 168 hours. No results for input(s): AMMONIA in the last 168 hours. CBC: Recent Labs  Lab 01/28/17 1018  WBC 13.9*  NEUTROABS 11.1*  HGB 14.4  HCT 42.8  MCV 92.0  PLT 187   Cardiac Enzymes: No results for input(s): CKTOTAL, CKMB, CKMBINDEX, TROPONINI in the last 168 hours.  BNP (last 3 results) No  results for input(s): BNP in the last 8760 hours.  ProBNP (last 3 results) No results for input(s): PROBNP in the last 8760 hours.  CBG: No results for input(s): GLUCAP in the last 168 hours.  Radiological Exams on Admission: US Scrotum  Result Date: 01/28/2017 CLINICAL DATA:  Since right testicle pain for 1 day.  Fever. EXAM: SCROTAL ULTRASOUND DOPPLER ULTRASOUND OF THE TESTICLES TECHNIQUE: Complete ultrasound examination of the testicles, epididymis, and other scrotal structures was performed. Color and spectral Doppler ultrasound were also utilized to evaluate blood flow to the testicles. COMPARISON:  None. FINDINGS: Right testicle Measurements: 3.8 x 2.3 x 2.8 cm. A complex cystic lesion is present within the parenchyma of the right testicle measuring 0.9 x 0.5 x 0.8 cm. The right testicle is diffusely increased in vascularity. Left testicle Measurements: 3.9 x 2.3 x 2.5 cm. No mass or microlithiasis visualized. Right epididymis: There is increased vascularity throughout the right epididymis. Left epididymis: Normal vascularity. There is an epididymal cyst measuring 0.9 x 0.7 x 0.6 cm. Hydrocele:  A right hydrocele is present with septations. Varicocele: Mild left varicocele is present. Veins measure up to 2 mm. Pulsed Doppler interrogation of both testes demonstrates normal low resistance arterial and venous waveforms bilaterally. IMPRESSION: No evidence of testicular torsion. There is increased vascularity within the right testicle and epididymis consistent with epididymo-orchitis. There is a complex cystic lesion in the right testicle measuring up 0.9 cm. Malignancy is not excluded. Urology consultation is recommended. Benign left epididymal cyst. Loculated right hydrocele worrisome foreign inflammatory process. Mild left varicocele. Electronically Signed   By: Marybelle Killings M.D.   On: 01/28/2017 12:03   [place] Scrotom Doppler  Result Date: 01/28/2017 CLINICAL DATA:  Since right testicle pain for 1  day.  Fever. EXAM: SCROTAL ULTRASOUND DOPPLER ULTRASOUND OF THE TESTICLES TECHNIQUE: Complete ultrasound examination of the testicles, epididymis, and other scrotal structures was performed. Color and spectral Doppler ultrasound were also utilized to evaluate blood flow to the testicles. COMPARISON:  None. FINDINGS: Right testicle Measurements: 3.8 x 2.3 x 2.8 cm. A complex cystic lesion is present within the parenchyma of the right testicle measuring 0.9 x 0.5 x 0.8 cm. The right testicle is diffusely increased in vascularity. Left testicle Measurements: 3.9 x 2.3 x 2.5 cm. No mass or microlithiasis visualized. Right epididymis: There is increased vascularity throughout the right epididymis. Left epididymis: Normal vascularity. There is an epididymal cyst measuring 0.9 x 0.7 x 0.6 cm. Hydrocele:  A right hydrocele is present with septations. Varicocele: Mild left varicocele is present. Veins measure up to 2 mm. Pulsed Doppler interrogation of both testes demonstrates normal low resistance arterial and venous waveforms bilaterally. IMPRESSION: No evidence of testicular torsion. There is increased vascularity within the right testicle and epididymis consistent with epididymo-orchitis. There is a complex cystic lesion in the right testicle measuring up 0.9 cm. Malignancy is not excluded. Urology consultation is recommended. Benign left epididymal cyst. Loculated right  hydrocele worrisome foreign inflammatory process. Mild left varicocele. Electronically Signed   By: Marybelle Killings M.D.   On: 01/28/2017 12:03    EKG: Independently reviewed.   Assessment/Plan Present on Admission: . Epididymitis  Active Problems:   Epididymitis  #Epididymo-orchitis Afebrile, with leukocytosis U/A+ for infection UC pending Ultrasound of the scrotum showed epididymo-orchitis S/p IV ceftriaxone and gentamycin Urology on board, recommend broad coverage with cefepime and doxycycline IV fluids Monitor  closely  #Hyperlipidemia Continue crestor  #GERD Continue pantoprazole     DVT prophylaxis: Lovenox  Code Status: Full   Family Communication: None at bedside  Disposition Plan: Home once stable  Consults called: Urology   Admission status: Observation    Alma Friendly MD [facility] Pager [phone]  If 7PM-7AM, please contact night-coverage [email or web address] Password [facility]  01/28/2017, 10:47 PM

## 2017-01-28 NOTE — ED Notes (Signed)
Pt transported to US

## 2017-01-28 NOTE — Progress Notes (Signed)
ANTIBIOTIC CONSULT NOTE - INITIAL  Pharmacy Consult for Cefepime Indication:  Epidydimo-orchitis   No Known Allergies  Patient Measurements: Height: 5\' 5"  (165.1 cm) Weight: 173 lb (78.5 kg) IBW/kg (Calculated) : 61.5 Adjusted Body Weight:    Vital Signs: Temp: 98.3 F (36.8 C) (11/04 0916) Temp Source: Oral (11/04 0916) BP: 141/85 (11/04 1515) Pulse Rate: 76 (11/04 1515) Intake/Output from previous day: No intake/output data recorded. Intake/Output from this shift: No intake/output data recorded.  Labs: Recent Labs    01/28/17 1018  WBC 13.9*  HGB 14.4  PLT 187  CREATININE 1.05   Estimated Creatinine Clearance: 66 mL/min (by C-G formula based on SCr of 1.05 mg/dL). No results for input(s): VANCOTROUGH, VANCOPEAK, VANCORANDOM, GENTTROUGH, GENTPEAK, GENTRANDOM, TOBRATROUGH, TOBRAPEAK, TOBRARND, AMIKACINPEAK, AMIKACINTROU, AMIKACIN in the last 72 hours.   Microbiology: No results found for this or any previous visit (from the past 720 hour(s)).  Medical History: Past Medical History:  Diagnosis Date  . Asthma   . Emphysema of lung ([place])   . Erectile dysfunction   . Hyperlipidemia   . Prostatitis    Assessment: CC/HPI: Testicle pain  PMH: h/o prostatitis, asthma, emphysema, ED, HLD, GERD, nicotine  ID: Epidydimo-orchitis (levaquin as outpatient). WBC 13.9. Afebrile. Scr 1.05  Antimicrobials this admission:  Doxy 11/4>> Cefepime 11/4>> Rocephin 11/4 x 1 Gent 2mg /kg 11/4 x 11   Goal of Therapy:  Eradication of infection  Plan:  Cefepime 1g IV q 8 hrs.  Martin Navarro, PharmD, BCPS Clinical Staff Pharmacist Pager [phone]  Martin Navarro 01/28/2017,4:29 PM

## 2017-01-28 NOTE — Consult Note (Signed)
Urology Consult Note   Requesting Attending Physician:  Alma Friendly, MD Service Providing Consult: Urology  Consulting Attending: Pilar Jarvis   Reason for Consult:  Epidydimo-orchitis    HPI: Martin Navarro is seen in consultation for reasons noted above at the request of Alma Friendly, MD for evaluation of epidydimo-orchitis   Seen by local PCP on 11/2 with right testicular pain for the past 2-3 days. Tender right testis. Some pain resolution w/ NSAIDs. Exam concerning for epididymitis. Started on Levaquin 500mg .   Pain did not resolve and progressed. Increased right testicular swelling. Seen in [facility] ED on 11/4. Endorses fever and chills. WBC - 14. U/A dirty ( + LE, nitrites, many bacteria, TNTC WBC).   Scrotal ultrasound revealed hypervascular right testis and epididymus. Complex cyst 0.9cm in right testis, right sided hydrocele with some loculations. Normal left testis, no evidence of torsion.   Today feels OK but has had intermittent fever. On exam, tender and indurated right testis with focal pain on the posterior aspect. Erythema over the right hemiscrotum, base of penis and slightly past the median raphe on the left   Of note, hx of prostatitis. Was seen by Dr. Jeffie Navarro in 10/2016 at which time his prostatitis had resolved. U/a from that visit benign.   No hematuria   Past Medical History: Past Medical History:  Diagnosis Date  . Asthma   . Emphysema of lung ([place])   . Erectile dysfunction   . Hyperlipidemia   . Prostatitis     Past Surgical History:  History reviewed. No pertinent surgical history.  Medication: No current facility-administered medications for this encounter.    Current Outpatient Medications  Medication Sig Dispense Refill  . aspirin 81 MG tablet Take 81 mg by mouth daily.      Marland Kitchen levofloxacin (LEVAQUIN) 500 MG tablet Take 1 tablet (500 mg total) by mouth daily. 10 tablet 0  . Omega-3 Fatty Acids (FISH OIL) 1200 MG CAPS Take 1,200 mg by mouth  daily.    Marland Kitchen omeprazole (PRILOSEC) 40 MG capsule TAKE 1 CAPSULE BY MOUTH EVERY DAY 90 capsule 1  . oxymetazoline (AFRIN) 0.05 % nasal spray Place 2 sprays 2 (two) times daily as needed into both nostrils for congestion.    . rosuvastatin (CRESTOR) 10 MG tablet TAKE 1 TABLET BY MOUTH EVERY DAY 90 tablet 1  . vitamin B-12 (CYANOCOBALAMIN) 1000 MCG tablet Take 1,000-5,000 mcg by mouth daily.     . vitamin E 400 UNIT capsule Take 1,200 Units by mouth daily.      Allergies: No Known Allergies  Social History: Social History   Tobacco Use  . Smoking status: Current Every Day Smoker    Packs/day: 2.00    Years: 53.00    Pack years: 106.00    Types: E-cigarettes  . Smokeless tobacco: Never Used  . Tobacco comment: Pt now vapes, quit tobacco 2016  Substance Use Topics  . Alcohol use: No  . Drug use: Not on file    Family History Family History  Problem Relation Age of Onset  . Hypertension Father   . Heart disease Father 71    Review of Systems 10 systems were reviewed and are negative except as noted specifically in the HPI.  Objective   Vital signs in last 24 hours: BP 118/89 (BP Location: Left Arm)   Pulse 94   Temp 98.3 F (36.8 C) (Oral)   Resp 12   Ht 5\' 5"  (1.651 m)   Wt 78.5 kg (  173 lb)   SpO2 98%   BMI 28.79 kg/m   Physical Exam General: NAD, A&O, resting, appropriate HEENT: [name], EOMI, MMM Pulmonary: Normal work of breathing Cardiovascular: HDS, adequate peripheral perfusion Abdomen: Soft, NTTP, nondistended. GU: , tender and indurated right testis with focal pain on the posterior aspect. Erythema over the right hemiscrotum, base of penis and slightly past the median raphe on the left. Left testis without pain, penis uncircumcise, no CVA tenderness Extremities: warm and well perfused Neuro: Appropriate, no focal neurological deficits  Most Recent Labs: Lab Results  Component Value Date   WBC 13.9 (H) 01/28/2017   HGB 14.4 01/28/2017   HCT 42.8  01/28/2017   PLT 187 01/28/2017    Lab Results  Component Value Date   NA 135 01/28/2017   K 4.1 01/28/2017   CL 103 01/28/2017   CO2 24 01/28/2017   BUN 12 01/28/2017   CREATININE 1.05 01/28/2017   CALCIUM 9.1 01/28/2017    Lab Results  Component Value Date   INR 1.18 09/20/2016     IMAGING: US Scrotum  Result Date: 01/28/2017 CLINICAL DATA:  Since right testicle pain for 1 day.  Fever. EXAM: SCROTAL ULTRASOUND DOPPLER ULTRASOUND OF THE TESTICLES TECHNIQUE: Complete ultrasound examination of the testicles, epididymis, and other scrotal structures was performed. Color and spectral Doppler ultrasound were also utilized to evaluate blood flow to the testicles. COMPARISON:  None. FINDINGS: Right testicle Measurements: 3.8 x 2.3 x 2.8 cm. A complex cystic lesion is present within the parenchyma of the right testicle measuring 0.9 x 0.5 x 0.8 cm. The right testicle is diffusely increased in vascularity. Left testicle Measurements: 3.9 x 2.3 x 2.5 cm. No mass or microlithiasis visualized. Right epididymis: There is increased vascularity throughout the right epididymis. Left epididymis: Normal vascularity. There is an epididymal cyst measuring 0.9 x 0.7 x 0.6 cm. Hydrocele:  A right hydrocele is present with septations. Varicocele: Mild left varicocele is present. Veins measure up to 2 mm. Pulsed Doppler interrogation of both testes demonstrates normal low resistance arterial and venous waveforms bilaterally. IMPRESSION: No evidence of testicular torsion. There is increased vascularity within the right testicle and epididymis consistent with epididymo-orchitis. There is a complex cystic lesion in the right testicle measuring up 0.9 cm. Malignancy is not excluded. Urology consultation is recommended. Benign left epididymal cyst. Loculated right hydrocele worrisome foreign inflammatory process. Mild left varicocele. Electronically Signed   By: Marybelle Killings M.D.   On: 01/28/2017 12:03   [place] Scrotom  Doppler  Result Date: 01/28/2017 CLINICAL DATA:  Since right testicle pain for 1 day.  Fever. EXAM: SCROTAL ULTRASOUND DOPPLER ULTRASOUND OF THE TESTICLES TECHNIQUE: Complete ultrasound examination of the testicles, epididymis, and other scrotal structures was performed. Color and spectral Doppler ultrasound were also utilized to evaluate blood flow to the testicles. COMPARISON:  None. FINDINGS: Right testicle Measurements: 3.8 x 2.3 x 2.8 cm. A complex cystic lesion is present within the parenchyma of the right testicle measuring 0.9 x 0.5 x 0.8 cm. The right testicle is diffusely increased in vascularity. Left testicle Measurements: 3.9 x 2.3 x 2.5 cm. No mass or microlithiasis visualized. Right epididymis: There is increased vascularity throughout the right epididymis. Left epididymis: Normal vascularity. There is an epididymal cyst measuring 0.9 x 0.7 x 0.6 cm. Hydrocele:  A right hydrocele is present with septations. Varicocele: Mild left varicocele is present. Veins measure up to 2 mm. Pulsed Doppler interrogation of both testes demonstrates normal low resistance arterial and venous waveforms  bilaterally. IMPRESSION: No evidence of testicular torsion. There is increased vascularity within the right testicle and epididymis consistent with epididymo-orchitis. There is a complex cystic lesion in the right testicle measuring up 0.9 cm. Malignancy is not excluded. Urology consultation is recommended. Benign left epididymal cyst. Loculated right hydrocele worrisome foreign inflammatory process. Mild left varicocele. Electronically Signed   By: Marybelle Killings M.D.   On: 01/28/2017 12:03    ------  Assessment:  66 y.o. male with refractory epididymo-orchitis and worsening of symptoms. Treated with appropriate initial course of abx; however, symptoms have worsened. This may represent antimicrobial resistance.   Ultrasound findings consistent with clinical picture of epididymo-orchitis. Subcentimeter hypoechoic  lesion in testis likely represents simple cyst. Low likelihood of abscess. Given its size, a conservative approach is warranted.   Recommend IV abx until pain and exam improve. Recommend broad coverage with cefepime and doxycyline. OK to narrow coverage as culture data results.   Urology will follow, recommend all treatment teams perform serial exams to ensure clinical improvement.   Recommendations: - Start broad spectrum coverage with cefepime and doxycycline - Follow up Urine culture and narrow abx as able  - Observation until pain, erythema and exam being to improve - Patient will require outpatient urologic follow up after resolution of infection with repeat scrotal ultrasound.  - No indication for surgical intervention  - Please contact urology if patients clinical exam worsens.    Thank you for this consult. Please contact the urology consult pager with any further questions/concerns.

## 2017-01-28 NOTE — Progress Notes (Signed)
Martin Navarro is a 66 y.o. male patient admitted from ED awake, alert - oriented  X 4 - no acute distress noted.  VSS - Blood pressure (!) 141/85, pulse 76, temperature 98.3 F (36.8 C), temperature source Oral, resp. rate 12, height 5\' 5"  (1.651 m), weight 78.5 kg (173 lb), SpO2 100 %.    IV in place, occlusive dsg intact without redness.  Orientation to room, and floor completed with information packet given to patient/family.  Patient declined safety video at this time.  Admission INP armband ID verified with patient/family, and in place.   SR up x 2, fall assessment complete, with patient and family able to verbalize understanding of risk associated with falls, and verbalized understanding to call nsg before up out of bed.  Call light within reach, patient able to voice, and demonstrate understanding.  Skin, clean-dry- intact without evidence of bruising, or skin tears.   No evidence of skin break down noted on exam.     Will cont to eval and treat per MD orders.  Dorris Carnes, RN 01/28/2017 7:45 PM

## 2017-01-28 NOTE — ED Provider Notes (Signed)
[facility] EMERGENCY DEPARTMENT Provider Note   CSN: [id number] Arrival date & time: 01/28/17  0911     History   Chief Complaint Chief Complaint  Patient presents with  . Testicle Pain    HPI Martin Navarro is a 66 y.o. male.  Right testicular pain and swelling since Thursday.  He was seen by his primary care doctor on Friday and diagnosed with epididymitis.  He was given Levaquin 500 mg daily.  He now complains of fever and chills.  In June 2018 he was diagnosed with prostatitis admitted to the hospital under the urology service.  No urethral discharge.  Severity of symptoms is moderate.  Palpation makes pain worse.      Past Medical History:  Diagnosis Date  . Asthma   . Emphysema of lung ([place])   . Erectile dysfunction   . Hyperlipidemia   . Prostatitis     Patient Active Problem List   Diagnosis Date Noted  . Hypokalemia   . AKI (acute kidney injury) ([place])   . Nausea   . Acute prostatitis 09/21/2016  . Sepsis ([place]) 09/21/2016  . 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 Erectile dysfunction 10/25/2010  . GERD (gastroesophageal reflux disease) 10/25/2010  . Hyperlipidemia 10/25/2010  . Nicotine abuse 10/25/2010    History reviewed. No pertinent surgical history.     Home Medications    Prior to Admission medications   Medication Sig Start Date End Date Taking? Authorizing Provider  aspirin 81 MG tablet Take 81 mg by mouth daily.      [provider]  levofloxacin (LEVAQUIN) 500 MG tablet Take 1 tablet (500 mg total) by mouth daily. 01/26/17   Nafziger, Tommi Rumps, NP  LORazepam (ATIVAN) 1 MG tablet Take one tablet one hour prior to procedure. 10/03/16   Burchette, Alinda Sierras, MD  Omega-3 Fatty Acids (FISH OIL) 1200 MG CAPS Take 1,200 mg by mouth daily.    [provider]  omeprazole (PRILOSEC) 40 MG capsule TAKE 1 CAPSULE BY MOUTH EVERY DAY 01/08/17   Burchette, Alinda Sierras, MD  rosuvastatin (CRESTOR) 10 MG tablet TAKE 1 TABLET BY MOUTH  EVERY DAY 01/08/17   Burchette, Alinda Sierras, MD  vitamin B-12 (CYANOCOBALAMIN) 1000 MCG tablet Take 1,000-5,000 mcg by mouth daily.     [provider]  vitamin E 400 UNIT capsule Take 1,200 Units by mouth daily.    [provider]    Family History Family History  Problem Relation Age of Onset  . Hypertension Father   . Heart disease Father 62    Social History Social History   Tobacco Use  . Smoking status: Current Every Day Smoker    Packs/day: 2.00    Years: 53.00    Pack years: 106.00    Types: E-cigarettes  . Smokeless tobacco: Never Used  . Tobacco comment: Pt now vapes, quit tobacco 2016  Substance Use Topics  . Alcohol use: No  . Drug use: Not on file     Allergies   Patient has no known allergies.   Review of Systems Review of Systems  All other systems reviewed and are negative.    Physical Exam Updated Vital Signs BP 118/89 (BP Location: Left Arm)   Pulse 94   Temp 98.3 F (36.8 C) (Oral)   Resp 12   Ht 5\' 5"  (1.651 m)   Wt 78.5 kg (173 lb)   SpO2 98%   BMI 28.79 kg/m   Physical Exam  Constitutional: He is oriented  to person, place, and time. He appears well-developed and well-nourished.  HENT:  Head: Normocephalic and atraumatic.  Eyes: Conjunctivae are normal.  Neck: Neck supple.  Cardiovascular: Normal rate and regular rhythm.  Pulmonary/Chest: Effort normal and breath sounds normal.  Abdominal: Soft. Bowel sounds are normal.  Genitourinary:  Genitourinary Comments: Genitourinary exam: Penis normal.  Left testicle normal.  Right testicle is approximately 4 cm x 3 cm and tender.  Musculoskeletal: Normal range of motion.  Neurological: He is alert and oriented to person, place, and time.  Skin: Skin is warm and dry.  Psychiatric: He has a normal mood and affect. His behavior is normal.  Nursing note and vitals reviewed.    ED Treatments / Results  Labs (all labs ordered are listed, but only abnormal results are  displayed) Labs Reviewed  CBC WITH DIFFERENTIAL/PLATELET - Abnormal; Notable for the following components:      Result Value   WBC 13.9 (*)    Neutro Abs 11.1 (*)    Monocytes Absolute 1.4 (*)    All other components within normal limits  BASIC METABOLIC PANEL - Abnormal; Notable for the following components:   Glucose, Bld 115 (*)    All other components within normal limits  URINALYSIS, ROUTINE W REFLEX MICROSCOPIC - Abnormal; Notable for the following components:   Color, Urine AMBER (*)    APPearance CLOUDY (*)    Hgb urine dipstick MODERATE (*)    Ketones, ur 5 (*)    Protein, ur 30 (*)    Nitrite POSITIVE (*)    Leukocytes, UA LARGE (*)    Bacteria, UA MANY (*)    Squamous Epithelial / LPF 0-5 (*)    Non Squamous Epithelial 0-5 (*)    All other components within normal limits  URINE CULTURE    EKG  EKG Interpretation None       Radiology US Scrotum  Result Date: 01/28/2017 CLINICAL DATA:  Since right testicle pain for 1 day.  Fever. EXAM: SCROTAL ULTRASOUND DOPPLER ULTRASOUND OF THE TESTICLES TECHNIQUE: Complete ultrasound examination of the testicles, epididymis, and other scrotal structures was performed. Color and spectral Doppler ultrasound were also utilized to evaluate blood flow to the testicles. COMPARISON:  None. FINDINGS: Right testicle Measurements: 3.8 x 2.3 x 2.8 cm. A complex cystic lesion is present within the parenchyma of the right testicle measuring 0.9 x 0.5 x 0.8 cm. The right testicle is diffusely increased in vascularity. Left testicle Measurements: 3.9 x 2.3 x 2.5 cm. No mass or microlithiasis visualized. Right epididymis: There is increased vascularity throughout the right epididymis. Left epididymis: Normal vascularity. There is an epididymal cyst measuring 0.9 x 0.7 x 0.6 cm. Hydrocele:  A right hydrocele is present with septations. Varicocele: Mild left varicocele is present. Veins measure up to 2 mm. Pulsed Doppler interrogation of both testes  demonstrates normal low resistance arterial and venous waveforms bilaterally. IMPRESSION: No evidence of testicular torsion. There is increased vascularity within the right testicle and epididymis consistent with epididymo-orchitis. There is a complex cystic lesion in the right testicle measuring up 0.9 cm. Malignancy is not excluded. Urology consultation is recommended. Benign left epididymal cyst. Loculated right hydrocele worrisome foreign inflammatory process. Mild left varicocele. Electronically Signed   By: Marybelle Killings M.D.   On: 01/28/2017 12:03   [place] Scrotom Doppler  Result Date: 01/28/2017 CLINICAL DATA:  Since right testicle pain for 1 day.  Fever. EXAM: SCROTAL ULTRASOUND DOPPLER ULTRASOUND OF THE TESTICLES TECHNIQUE: Complete ultrasound examination of the  testicles, epididymis, and other scrotal structures was performed. Color and spectral Doppler ultrasound were also utilized to evaluate blood flow to the testicles. COMPARISON:  None. FINDINGS: Right testicle Measurements: 3.8 x 2.3 x 2.8 cm. A complex cystic lesion is present within the parenchyma of the right testicle measuring 0.9 x 0.5 x 0.8 cm. The right testicle is diffusely increased in vascularity. Left testicle Measurements: 3.9 x 2.3 x 2.5 cm. No mass or microlithiasis visualized. Right epididymis: There is increased vascularity throughout the right epididymis. Left epididymis: Normal vascularity. There is an epididymal cyst measuring 0.9 x 0.7 x 0.6 cm. Hydrocele:  A right hydrocele is present with septations. Varicocele: Mild left varicocele is present. Veins measure up to 2 mm. Pulsed Doppler interrogation of both testes demonstrates normal low resistance arterial and venous waveforms bilaterally. IMPRESSION: No evidence of testicular torsion. There is increased vascularity within the right testicle and epididymis consistent with epididymo-orchitis. There is a complex cystic lesion in the right testicle measuring up 0.9 cm. Malignancy  is not excluded. Urology consultation is recommended. Benign left epididymal cyst. Loculated right hydrocele worrisome foreign inflammatory process. Mild left varicocele. Electronically Signed   By: Marybelle Killings M.D.   On: 01/28/2017 12:03    Procedures Procedures (including critical care time)  Medications Ordered in ED Medications  cefTRIAXone (ROCEPHIN) 1 g in dextrose 5 % 50 mL IVPB (not administered)     Initial Impression / Assessment and Plan / ED Course  I have reviewed the triage vital signs and the nursing notes.  Pertinent labs & imaging results that were available during my care of the patient were reviewed by me and considered in my medical decision making (see chart for details).     1220:   Rocephin 1 g IV given.  Will consult urology.  1230:   Discussed with urologist Dr. Dayle Points.  He recommended adding gentamicin.  Will consult.  Admit to general medicine.  Final Clinical Impressions(s) / ED Diagnoses   Final diagnoses:  Swelling of right testicle    New Prescriptions This SmartLink is deprecated. Use AVSMEDLIST instead to display the medication list for a patient.   Nat Christen, MD 01/28/17 [phone]

## 2017-01-29 DIAGNOSIS — N50811 Right testicular pain: Secondary | ICD-10-CM | POA: Diagnosis present

## 2017-01-29 DIAGNOSIS — F17290 Nicotine dependence, other tobacco product, uncomplicated: Secondary | ICD-10-CM | POA: Diagnosis present

## 2017-01-29 DIAGNOSIS — I10 Essential (primary) hypertension: Secondary | ICD-10-CM | POA: Diagnosis not present

## 2017-01-29 DIAGNOSIS — Z79899 Other long term (current) drug therapy: Secondary | ICD-10-CM | POA: Diagnosis not present

## 2017-01-29 DIAGNOSIS — E785 Hyperlipidemia, unspecified: Secondary | ICD-10-CM | POA: Diagnosis present

## 2017-01-29 DIAGNOSIS — N451 Epididymitis: Secondary | ICD-10-CM

## 2017-01-29 DIAGNOSIS — D72829 Elevated white blood cell count, unspecified: Secondary | ICD-10-CM

## 2017-01-29 DIAGNOSIS — J45909 Unspecified asthma, uncomplicated: Secondary | ICD-10-CM | POA: Diagnosis present

## 2017-01-29 DIAGNOSIS — N453 Epididymo-orchitis: Secondary | ICD-10-CM | POA: Diagnosis not present

## 2017-01-29 DIAGNOSIS — K219 Gastro-esophageal reflux disease without esophagitis: Secondary | ICD-10-CM | POA: Diagnosis present

## 2017-01-29 DIAGNOSIS — Z7982 Long term (current) use of aspirin: Secondary | ICD-10-CM | POA: Diagnosis not present

## 2017-01-29 DIAGNOSIS — J439 Emphysema, unspecified: Secondary | ICD-10-CM | POA: Diagnosis present

## 2017-01-29 LAB — COMPREHENSIVE METABOLIC PANEL
ALT: 29 U/L (ref 17–63)
AST: 37 U/L (ref 15–41)
Albumin: 3 g/dL — ABNORMAL LOW (ref 3.5–5.0)
Alkaline Phosphatase: 96 U/L (ref 38–126)
Anion gap: 7 (ref 5–15)
BILIRUBIN TOTAL: 0.9 mg/dL (ref 0.3–1.2)
BUN: 11 mg/dL (ref 6–20)
CO2: 22 mmol/L (ref 22–32)
CREATININE: 0.97 mg/dL (ref 0.61–1.24)
Calcium: 8.3 mg/dL — ABNORMAL LOW (ref 8.9–10.3)
Chloride: 102 mmol/L (ref 101–111)
GFR calc Af Amer: >60 mL/min (ref >60)
Glucose, Bld: 109 mg/dL — ABNORMAL HIGH (ref 65–99)
POTASSIUM: 4 mmol/L (ref 3.5–5.1)
Sodium: 131 mmol/L — ABNORMAL LOW (ref 135–145)
TOTAL PROTEIN: 6.4 g/dL — AB (ref 6.5–8.1)

## 2017-01-29 LAB — HEMOGLOBIN A1C
Hgb A1c MFr Bld: 5.9 % — ABNORMAL HIGH (ref 4.8–5.6)
Mean Plasma Glucose: 122.63 mg/dL

## 2017-01-29 LAB — CBC
HEMATOCRIT: 39.7 % (ref 39.0–52.0)
Hemoglobin: 13.4 g/dL (ref 13.0–17.0)
MCH: 30.9 pg (ref 26.0–34.0)
MCHC: 33.8 g/dL (ref 30.0–36.0)
MCV: 91.5 fL (ref 78.0–100.0)
PLATELETS: 199 10*3/uL (ref 150–400)
RBC: 4.34 MIL/uL (ref 4.22–5.81)
RDW: 13.6 % (ref 11.5–15.5)
WBC: 9.8 10*3/uL (ref 4.0–10.5)

## 2017-01-29 LAB — APTT: APTT: 35 s (ref 24–36)

## 2017-01-29 LAB — PROTIME-INR
INR: 1.1
PROTHROMBIN TIME: 14.1 s (ref 11.4–15.2)

## 2017-01-29 MED ORDER — DIPHENHYDRAMINE HCL 25 MG PO CAPS
25.0000 mg | ORAL_CAPSULE | Freq: Once | ORAL | Status: AC
  Administered 2017-01-29: 25 mg via ORAL
  Filled 2017-01-29: qty 1

## 2017-01-29 NOTE — Progress Notes (Signed)
PROGRESS NOTE    [name]  EXH:[id number] DOB: 1950-04-16 DOA: 01/28/2017 PCP: Martin Post, MD   Outpatient Specialists:    Brief Narrative:  Martin Navarro is a 66 y.o. male with medical history significant for asthma, hyperlipidemia, GERD, prostatitis, presents to the ED complaining of right testicular pain and swelling for the past 4 days.  Patient initially saw his PCP on 11/2 for similar complaints and was discharged on p.o. Levaquin.  Patient continued to have more pain and significant swelling with associated fever/chills, despite taking p.o. Levaquin.  Of note, patient was diagnosed with prostatitis in June 2018 under the urology service.  Patient denied any urethral discharge, dysuria, chest pain, abdominal pain, nausea/vomiting.  Patient admitted for further management, consult to urology    Assessment & Plan:   Active Problems:   Epididymitis   Epididymo-orchitis Afebrile, with leukocytosis U/A+ for infection UC pending-- >100,000 gram negative rods Ultrasound of the scrotum showed epididymo-orchitis Urology on board: recommend broad coverage with cefepime and doxycycline IV fluids Monitor closely -WBC count trending down  Hyperlipidemia Continue crestor  GERD Continue pantoprazole     DVT prophylaxis:  Lovenox   Code Status: Full Code   Family Communication:   Disposition Plan:     Consultants:    Subjective: Still with swelling and pain Does not want to take our B12 here  Objective: Vitals:   01/29/17 0203 01/29/17 0409 01/29/17 0559 01/29/17 0900  BP:   (!) 152/87   Pulse:   77   Resp:   17   Temp: 99.6 F (37.6 C) 98.1 F (36.7 C) 97.8 F (36.6 C) 98.5 F (36.9 C)  TempSrc: Oral Oral Oral   SpO2:   100%   Weight:      Height:       No intake or output data in the 24 hours ending 01/29/17 1214 Filed Weights   01/28/17 0917  Weight: 78.5 kg (173 lb)    Examination:  General exam: Appears calm and  comfortable  Respiratory system: Clear to auscultation. Respiratory effort normal. Cardiovascular system: S1 & S2 heard, RRR. No JVD, murmurs, rubs, gallops or clicks. No pedal edema. Gastrointestinal system: Abdomen is nondistended, soft and nontender. No organomegaly or masses felt. Normal bowel sounds heard. Central nervous system: Alert and oriented. No focal neurological deficits. Extremities: Symmetric 5 x 5 power. Skin: enlarged testicles Psychiatry: Judgement and insight appear normal. Mood & affect appropriate.     Data Reviewed: I have personally reviewed following labs and imaging studies  CBC: Recent Labs  Lab 01/28/17 1018 01/29/17 0120  WBC 13.9* 9.8  NEUTROABS 11.1*  --   HGB 14.4 13.4  HCT 42.8 39.7  MCV 92.0 91.5  PLT 187 381   Basic Metabolic Panel: Recent Labs  Lab 01/28/17 1018 01/29/17 0120  NA 135 131*  K 4.1 4.0  CL 103 102  CO2 24 22  GLUCOSE 115* 109*  BUN 12 11  CREATININE 1.05 0.97  CALCIUM 9.1 8.3*   GFR: Estimated Creatinine Clearance: 71.4 mL/min (by C-G formula based on SCr of 0.97 mg/dL). Liver Function Tests: Recent Labs  Lab 01/29/17 0120  AST 37  ALT 29  ALKPHOS 96  BILITOT 0.9  PROT 6.4*  ALBUMIN 3.0*   No results for input(s): LIPASE, AMYLASE in the last 168 hours. No results for input(s): AMMONIA in the last 168 hours. Coagulation Profile: Recent Labs  Lab 01/29/17 0120  INR 1.10   Cardiac Enzymes: No results  for input(s): CKTOTAL, CKMB, CKMBINDEX, TROPONINI in the last 168 hours. BNP (last 3 results) No results for input(s): PROBNP in the last 8760 hours. HbA1C: Recent Labs    01/29/17 0120  HGBA1C 5.9*   CBG: No results for input(s): GLUCAP in the last 168 hours. Lipid Profile: No results for input(s): CHOL, HDL, LDLCALC, TRIG, CHOLHDL, LDLDIRECT in the last 72 hours. Thyroid Function Tests: No results for input(s): TSH, T4TOTAL, FREET4, T3FREE, THYROIDAB in the last 72 hours. Anemia Panel: No results  for input(s): VITAMINB12, FOLATE, FERRITIN, TIBC, IRON, RETICCTPCT in the last 72 hours. Urine analysis:    Component Value Date/Time   COLORURINE AMBER (A) 01/28/2017 1035   APPEARANCEUR CLOUDY (A) 01/28/2017 1035   LABSPEC 1.023 01/28/2017 1035   PHURINE 5.0 01/28/2017 1035   GLUCOSEU NEGATIVE 01/28/2017 1035   HGBUR MODERATE (A) 01/28/2017 1035   BILIRUBINUR NEGATIVE 01/28/2017 1035   BILIRUBINUR negative 09/21/2016 1334   KETONESUR 5 (A) 01/28/2017 1035   PROTEINUR 30 (A) 01/28/2017 1035   UROBILINOGEN 1.0 09/21/2016 1334   UROBILINOGEN 0.2 06/07/2012 1635   NITRITE POSITIVE (A) 01/28/2017 1035   LEUKOCYTESUR LARGE (A) 01/28/2017 1035      Recent Results (from the past 240 hour(s))  Urine culture     Status: Abnormal (Preliminary result)   Collection Time: 01/28/17 12:46 PM  Result Value Ref Range Status   Specimen Description URINE, CLEAN CATCH  Final   Special Requests NONE  Final   Culture >=100,000 COLONIES/mL GRAM NEGATIVE RODS (A)  Final   Report Status PENDING  Incomplete      Anti-infectives (From admission, onward)   Start     Dose/Rate Route Frequency Ordered Stop   01/28/17 1800  ceFEPIme (MAXIPIME) 1 g in dextrose 5 % 50 mL IVPB     1 g 100 mL/hr over 30 Minutes Intravenous Every 8 hours 01/28/17 1632     01/28/17 1630  doxycycline (VIBRAMYCIN) 100 mg in dextrose 5 % 250 mL IVPB     100 mg 125 mL/hr over 120 Minutes Intravenous Every 12 hours 01/28/17 1619     01/28/17 1315  gentamicin (GARAMYCIN) 120 mg in dextrose 5 % 50 mL IVPB     2 mg/kg  61.5 kg (Ideal) 106 mL/hr over 30 Minutes Intravenous  Once 01/28/17 1234 01/28/17 1426   01/28/17 1130  cefTRIAXone (ROCEPHIN) 1 g in dextrose 5 % 50 mL IVPB     1 g 100 mL/hr over 30 Minutes Intravenous  Once 01/28/17 1122 01/28/17 1332       Radiology Studies: US Scrotum  Result Date: 01/28/2017 CLINICAL DATA:  Since right testicle pain for 1 day.  Fever. EXAM: SCROTAL ULTRASOUND DOPPLER ULTRASOUND OF  THE TESTICLES TECHNIQUE: Complete ultrasound examination of the testicles, epididymis, and other scrotal structures was performed. Color and spectral Doppler ultrasound were also utilized to evaluate blood flow to the testicles. COMPARISON:  None. FINDINGS: Right testicle Measurements: 3.8 x 2.3 x 2.8 cm. A complex cystic lesion is present within the parenchyma of the right testicle measuring 0.9 x 0.5 x 0.8 cm. The right testicle is diffusely increased in vascularity. Left testicle Measurements: 3.9 x 2.3 x 2.5 cm. No mass or microlithiasis visualized. Right epididymis: There is increased vascularity throughout the right epididymis. Left epididymis: Normal vascularity. There is an epididymal cyst measuring 0.9 x 0.7 x 0.6 cm. Hydrocele:  A right hydrocele is present with septations. Varicocele: Mild left varicocele is present. Veins measure up to 2 mm. Pulsed  Doppler interrogation of both testes demonstrates normal low resistance arterial and venous waveforms bilaterally. IMPRESSION: No evidence of testicular torsion. There is increased vascularity within the right testicle and epididymis consistent with epididymo-orchitis. There is a complex cystic lesion in the right testicle measuring up 0.9 cm. Malignancy is not excluded. Urology consultation is recommended. Benign left epididymal cyst. Loculated right hydrocele worrisome foreign inflammatory process. Mild left varicocele. Electronically Signed   By: Marybelle Killings M.D.   On: 01/28/2017 12:03   [place] Scrotom Doppler  Result Date: 01/28/2017 CLINICAL DATA:  Since right testicle pain for 1 day.  Fever. EXAM: SCROTAL ULTRASOUND DOPPLER ULTRASOUND OF THE TESTICLES TECHNIQUE: Complete ultrasound examination of the testicles, epididymis, and other scrotal structures was performed. Color and spectral Doppler ultrasound were also utilized to evaluate blood flow to the testicles. COMPARISON:  None. FINDINGS: Right testicle Measurements: 3.8 x 2.3 x 2.8 cm. A complex  cystic lesion is present within the parenchyma of the right testicle measuring 0.9 x 0.5 x 0.8 cm. The right testicle is diffusely increased in vascularity. Left testicle Measurements: 3.9 x 2.3 x 2.5 cm. No mass or microlithiasis visualized. Right epididymis: There is increased vascularity throughout the right epididymis. Left epididymis: Normal vascularity. There is an epididymal cyst measuring 0.9 x 0.7 x 0.6 cm. Hydrocele:  A right hydrocele is present with septations. Varicocele: Mild left varicocele is present. Veins measure up to 2 mm. Pulsed Doppler interrogation of both testes demonstrates normal low resistance arterial and venous waveforms bilaterally. IMPRESSION: No evidence of testicular torsion. There is increased vascularity within the right testicle and epididymis consistent with epididymo-orchitis. There is a complex cystic lesion in the right testicle measuring up 0.9 cm. Malignancy is not excluded. Urology consultation is recommended. Benign left epididymal cyst. Loculated right hydrocele worrisome foreign inflammatory process. Mild left varicocele. Electronically Signed   By: Marybelle Killings M.D.   On: 01/28/2017 12:03        Scheduled Meds: . aspirin  81 mg Oral Daily  . enoxaparin (LOVENOX) injection  40 mg Subcutaneous Q24H  . pantoprazole  40 mg Oral Daily  . rosuvastatin  10 mg Oral Daily  . sodium chloride flush  3 mL Intravenous Q12H  . traZODone  50 mg Oral QHS  . vitamin E  1,200 Units Oral Daily   Continuous Infusions: . sodium chloride 100 mL/hr at 01/29/17 0947  . sodium chloride    . ceFEPime (MAXIPIME) IV 1 g (01/29/17 1000)  . doxycycline (VIBRAMYCIN) IV Stopped (01/29/17 0807)     LOS: 0 days    Time spent: 81 min    [place], DO [facility] Pager [phone]  If 7PM-7AM, please contact night-coverage [email or web address] Password [email or web address] 01/29/2017, 12:14 PM

## 2017-01-29 NOTE — Progress Notes (Signed)
Patient education completed regarding risks and potential effects on lungs of vaping.  Printed out the following link source as patient handout from the CDC.  [email or web address]  Donneta Romberg, SN [facility]

## 2017-01-29 NOTE — Progress Notes (Signed)
Educated patient how to use incentive spirometer.   Bekah FridayBethann Goo, [facility]

## 2017-01-30 LAB — CBC
HCT: 36.3 % — ABNORMAL LOW (ref 39.0–52.0)
Hemoglobin: 11.9 g/dL — ABNORMAL LOW (ref 13.0–17.0)
MCH: 30 pg (ref 26.0–34.0)
MCHC: 32.8 g/dL (ref 30.0–36.0)
MCV: 91.4 fL (ref 78.0–100.0)
Platelets: 187 10*3/uL (ref 150–400)
RBC: 3.97 MIL/uL — ABNORMAL LOW (ref 4.22–5.81)
RDW: 14 % (ref 11.5–15.5)
WBC: 7.6 10*3/uL (ref 4.0–10.5)

## 2017-01-30 LAB — BASIC METABOLIC PANEL
Anion gap: 6 (ref 5–15)
BUN: 9 mg/dL (ref 6–20)
CHLORIDE: 106 mmol/L (ref 101–111)
CO2: 23 mmol/L (ref 22–32)
Calcium: 8.4 mg/dL — ABNORMAL LOW (ref 8.9–10.3)
Creatinine, Ser: 0.83 mg/dL (ref 0.61–1.24)
GFR calc Af Amer: >60 mL/min (ref >60)
GFR calc non Af Amer: >60 mL/min (ref >60)
GLUCOSE: 168 mg/dL — AB (ref 65–99)
POTASSIUM: 4 mmol/L (ref 3.5–5.1)
SODIUM: 135 mmol/L (ref 135–145)

## 2017-01-30 LAB — URINE CULTURE: Culture: >=100000 — AB

## 2017-01-30 NOTE — Progress Notes (Signed)
PROGRESS NOTE    Martin Navarro  UYQ:[id number] DOB: 06-Mar-1951 DOA: 01/28/2017 PCP: [name], MD   Outpatient Specialists:    Brief Narrative:  Martin Navarro is a 66 y.o. male with medical history significant for asthma, hyperlipidemia, GERD, prostatitis, presents to the ED complaining of right testicular pain and swelling for the past 4 days.  Patient initially saw his PCP on 11/2 for similar complaints and was discharged on p.o. Levaquin.  Patient continued to have more pain and significant swelling with associated fever/chills, despite taking p.o. Levaquin.  Of note, patient was diagnosed with prostatitis in June 2018 under the urology service.  Patient denied any urethral discharge, dysuria, chest pain, abdominal pain, nausea/vomiting.  Patient admitted for further management, consult to urology    Assessment & Plan:   Active Problems:   Epididymitis   Epididymo-orchitis U/A+  UC -- >100,000 gram negative rods- e coli Ultrasound of the scrotum showed epididymo-orchitis Urology on board: recommend broad coverage with cefepime and doxycycline with outpatient follow up with urology IV fluids Monitor closely -WBC count trending down Plan to change to PO ceftin in the AM for total of 10 days  Hyperlipidemia Continue crestor  GERD Continue pantoprazole     DVT prophylaxis:  Lovenox   Code Status: Full Code   Family Communication:   Disposition Plan:     Consultants:    Subjective: Low grade fever last PM Scrotum less tender  Objective: Vitals:   01/29/17 1410 01/29/17 2347 01/30/17 0200 01/30/17 0511  BP: 132/87 125/73  (!) 118/55  Pulse: 69 80  (!) 48  Resp: 14 18  18   Temp: 99.2 F (37.3 C) 100 F (37.8 C) 98.9 F (37.2 C) 98.6 F (37 C)  TempSrc: Oral Oral Oral Oral  SpO2: 100% 97%  (!) 79%  Weight:      Height:        Intake/Output Summary (Last 24 hours) at 01/30/2017 1250 Last data filed at 01/30/2017 6387 Gross per  24 hour  Intake 1629.17 ml  Output -  Net 1629.17 ml   Filed Weights   01/28/17 0917  Weight: 78.5 kg (173 lb)    Examination:  General exam: NAD, in bed Respiratory system: no wheezing Cardiovascular system: rrr Gastrointestinal system: +BS ,soft Central nervous system: A+Ox3 Psychiatry: normal mood    Data Reviewed: I have personally reviewed following labs and imaging studies  CBC: Recent Labs  Lab 01/28/17 1018 01/29/17 0120 01/30/17 0627  WBC 13.9* 9.8 7.6  NEUTROABS 11.1*  --   --   HGB 14.4 13.4 11.9*  HCT 42.8 39.7 36.3*  MCV 92.0 91.5 91.4  PLT 187 199 564   Basic Metabolic Panel: Recent Labs  Lab 01/28/17 1018 01/29/17 0120 01/30/17 0627  NA 135 131* 135  K 4.1 4.0 4.0  CL 103 102 106  CO2 24 22 23   GLUCOSE 115* 109* 168*  BUN 12 11 9   CREATININE 1.05 0.97 0.83  CALCIUM 9.1 8.3* 8.4*   GFR: Estimated Creatinine Clearance: 83.4 mL/min (by C-G formula based on SCr of 0.83 mg/dL). Liver Function Tests: Recent Labs  Lab 01/29/17 0120  AST 37  ALT 29  ALKPHOS 96  BILITOT 0.9  PROT 6.4*  ALBUMIN 3.0*   No results for input(s): LIPASE, AMYLASE in the last 168 hours. No results for input(s): AMMONIA in the last 168 hours. Coagulation Profile: Recent Labs  Lab 01/29/17 0120  INR 1.10   Cardiac Enzymes: No results for  input(s): CKTOTAL, CKMB, CKMBINDEX, TROPONINI in the last 168 hours. BNP (last 3 results) No results for input(s): PROBNP in the last 8760 hours. HbA1C: Recent Labs    01/29/17 0120  HGBA1C 5.9*   CBG: No results for input(s): GLUCAP in the last 168 hours. Lipid Profile: No results for input(s): CHOL, HDL, LDLCALC, TRIG, CHOLHDL, LDLDIRECT in the last 72 hours. Thyroid Function Tests: No results for input(s): TSH, T4TOTAL, FREET4, T3FREE, THYROIDAB in the last 72 hours. Anemia Panel: No results for input(s): VITAMINB12, FOLATE, FERRITIN, TIBC, IRON, RETICCTPCT in the last 72 hours. Urine analysis:    Component  Value Date/Time   COLORURINE AMBER (A) 01/28/2017 1035   APPEARANCEUR CLOUDY (A) 01/28/2017 1035   LABSPEC 1.023 01/28/2017 1035   PHURINE 5.0 01/28/2017 1035   GLUCOSEU NEGATIVE 01/28/2017 1035   HGBUR MODERATE (A) 01/28/2017 1035   BILIRUBINUR NEGATIVE 01/28/2017 1035   BILIRUBINUR negative 09/21/2016 1334   KETONESUR 5 (A) 01/28/2017 1035   PROTEINUR 30 (A) 01/28/2017 1035   UROBILINOGEN 1.0 09/21/2016 1334   UROBILINOGEN 0.2 06/07/2012 1635   NITRITE POSITIVE (A) 01/28/2017 1035   LEUKOCYTESUR LARGE (A) 01/28/2017 1035      Recent Results (from the past 240 hour(s))  Urine culture     Status: Abnormal   Collection Time: 01/28/17 12:46 PM  Result Value Ref Range Status   Specimen Description URINE, CLEAN CATCH  Final   Special Requests NONE  Final   Culture >=100,000 COLONIES/mL ESCHERICHIA COLI (A)  Final   Report Status 01/30/2017 FINAL  Final   Organism ID, Bacteria ESCHERICHIA COLI (A)  Final      Susceptibility   Escherichia coli - MIC*    AMPICILLIN 16 INTERMEDIATE Intermediate     CEFAZOLIN <=4 SENSITIVE Sensitive     CEFTRIAXONE <=1 SENSITIVE Sensitive     CIPROFLOXACIN >=4 RESISTANT Resistant     GENTAMICIN <=1 SENSITIVE Sensitive     IMIPENEM <=0.25 SENSITIVE Sensitive     NITROFURANTOIN <=16 SENSITIVE Sensitive     TRIMETH/SULFA >=320 RESISTANT Resistant     AMPICILLIN/SULBACTAM 4 SENSITIVE Sensitive     PIP/TAZO <=4 SENSITIVE Sensitive     Extended ESBL NEGATIVE Sensitive     * >=100,000 COLONIES/mL ESCHERICHIA COLI      Anti-infectives (From admission, onward)   Start     Dose/Rate Route Frequency Ordered Stop   01/28/17 1800  ceFEPIme (MAXIPIME) 1 g in dextrose 5 % 50 mL IVPB     1 g 100 mL/hr over 30 Minutes Intravenous Every 8 hours 01/28/17 1632     01/28/17 1630  doxycycline (VIBRAMYCIN) 100 mg in dextrose 5 % 250 mL IVPB     100 mg 125 mL/hr over 120 Minutes Intravenous Every 12 hours 01/28/17 1619     01/28/17 1315  gentamicin (GARAMYCIN)  120 mg in dextrose 5 % 50 mL IVPB     2 mg/kg  61.5 kg (Ideal) 106 mL/hr over 30 Minutes Intravenous  Once 01/28/17 1234 01/28/17 1426   01/28/17 1130  cefTRIAXone (ROCEPHIN) 1 g in dextrose 5 % 50 mL IVPB     1 g 100 mL/hr over 30 Minutes Intravenous  Once 01/28/17 1122 01/28/17 1332       Radiology Studies: No results found.      Scheduled Meds: . aspirin  81 mg Oral Daily  . enoxaparin (LOVENOX) injection  40 mg Subcutaneous Q24H  . pantoprazole  40 mg Oral Daily  . rosuvastatin  10 mg Oral Daily  .  sodium chloride flush  3 mL Intravenous Q12H  . traZODone  50 mg Oral QHS  . vitamin E  1,200 Units Oral Daily   Continuous Infusions: . sodium chloride 100 mL/hr at 01/30/17 0817  . sodium chloride    . ceFEPime (MAXIPIME) IV Stopped (01/30/17 0950)  . doxycycline (VIBRAMYCIN) IV Stopped (01/30/17 2811)     LOS: 1 day    Time spent: 36 min    [place], DO [facility] Pager [phone]  If 7PM-7AM, please contact night-coverage [email or web address] Password [email or web address] 01/30/2017, 12:50 PM

## 2017-01-31 DIAGNOSIS — I10 Essential (primary) hypertension: Secondary | ICD-10-CM

## 2017-01-31 MED ORDER — CEFUROXIME AXETIL 500 MG PO TABS
500.0000 mg | ORAL_TABLET | Freq: Two times a day (BID) | ORAL | Status: DC
  Administered 2017-01-31: 500 mg via ORAL
  Filled 2017-01-31: qty 1

## 2017-01-31 MED ORDER — CEFUROXIME AXETIL 500 MG PO TABS: 500.0000 mg | ORAL_TABLET | Freq: Two times a day (BID) | ORAL | 0 refills | Status: DC

## 2017-01-31 NOTE — Progress Notes (Signed)
[place] [facility] to be D/C'd to home per MD order.  Discussed with the patient and all questions fully answered.  VSS, Skin clean, dry and intact without evidence of skin break down, no evidence of skin tears noted. IV catheter discontinued intact. Site without signs and symptoms of complications. Dressing and pressure applied.  An After Visit Summary was printed and given to the patient. Patient received prescription.  D/c education completed with patient/family including follow up instructions, medication list, d/c activities limitations if indicated, with other d/c instructions as indicated by MD - patient able to verbalize understanding, all questions fully answered.   Patient instructed to return to ED, call 911, or call MD for any changes in condition.   Patient escorted via [place], and D/C home via private auto.  Morley Kos Price 01/31/2017 10:57 AM

## 2017-01-31 NOTE — Discharge Summary (Signed)
Physician Discharge Summary  Martin Navarro ERX:[id number] DOB: 1950-12-13 DOA: 01/28/2017  PCP: [name], MD  Admit date: 01/28/2017 Discharge date: 01/31/2017   Recommendations for Outpatient Follow-Up:   1. Close follow up with Dr. Jeffie Pollock- Urology to ensure resolution   Discharge Diagnosis:   Active Problems:   Epididymitis   Discharge disposition:  Home  Discharge Condition: Improved.  Diet recommendation: Low sodium, heart healthy.  Wound care: None.   History of Present Illness:   Martin Navarro is a 66 y.o. male with medical history significant for asthma, hyperlipidemia, GERD, prostatitis, presents to the ED complaining of right testicular pain and swelling for the past 4 days.  Patient initially saw his PCP on 11/2 for similar complaints and was discharged on p.o. Levaquin.  Patient continued to have more pain and significant swelling with associated fever/chills, despite taking p.o. Levaquin.  Of note, patient was diagnosed with prostatitis in June 2018 under the urology service.  Patient denied any urethral discharge, dysuria, chest pain, abdominal pain, nausea/vomiting.  Patient admitted for further management, consult to urology     Hospital Course by Problem:   Epididymo-orchitis U/A+  UC -- >100,000 gram negative rods- e coli Ultrasound of the scrotum showed epididymo-orchitis Urology on board: recommendbroadcoverage with cefepime and doxycycline with outpatient follow up with urology-- once de-escalate abx once cultures back IV fluids Monitor closely -WBC count trending down/afebrile/pain improved  PO ceftin in the AM for total of 10 days  Hyperlipidemia Continue crestor  GERD Continue pantoprazole      Medical Consultants:    urology   Discharge Exam:   Vitals:   01/31/17 0200 01/31/17 0527  BP:  130/72  Pulse:  63  Resp:  18  Temp: 98 F (36.7 C) 97.9 F (36.6 C)  SpO2:  97%   Vitals:   01/30/17 1343  01/30/17 2226 01/31/17 0200 01/31/17 0527  BP: (!) 132/91 (!) 143/76  130/72  Pulse: 72 71  63  Resp: 18 18  18   Temp: 98.2 F (36.8 C) (!) 97.4 F (36.3 C) 98 F (36.7 C) 97.9 F (36.6 C)  TempSrc: Oral Oral Oral Oral  SpO2: 96% 100%  97%  Weight:      Height:        Gen:  NAD   The results of significant diagnostics from this hospitalization (including imaging, microbiology, ancillary and laboratory) are listed below for reference.     Procedures and Diagnostic Studies:   US Scrotum  Result Date: 01/28/2017 CLINICAL DATA:  Since right testicle pain for 1 day.  Fever. EXAM: SCROTAL ULTRASOUND DOPPLER ULTRASOUND OF THE TESTICLES TECHNIQUE: Complete ultrasound examination of the testicles, epididymis, and other scrotal structures was performed. Color and spectral Doppler ultrasound were also utilized to evaluate blood flow to the testicles. COMPARISON:  None. FINDINGS: Right testicle Measurements: 3.8 x 2.3 x 2.8 cm. A complex cystic lesion is present within the parenchyma of the right testicle measuring 0.9 x 0.5 x 0.8 cm. The right testicle is diffusely increased in vascularity. Left testicle Measurements: 3.9 x 2.3 x 2.5 cm. No mass or microlithiasis visualized. Right epididymis: There is increased vascularity throughout the right epididymis. Left epididymis: Normal vascularity. There is an epididymal cyst measuring 0.9 x 0.7 x 0.6 cm. Hydrocele:  A right hydrocele is present with septations. Varicocele: Mild left varicocele is present. Veins measure up to 2 mm. Pulsed Doppler interrogation of both testes demonstrates normal low resistance arterial and venous waveforms bilaterally. IMPRESSION:  No evidence of testicular torsion. There is increased vascularity within the right testicle and epididymis consistent with epididymo-orchitis. There is a complex cystic lesion in the right testicle measuring up 0.9 cm. Malignancy is not excluded. Urology consultation is recommended. Benign left  epididymal cyst. Loculated right hydrocele worrisome foreign inflammatory process. Mild left varicocele. Electronically Signed   By: Marybelle Killings M.D.   On: 01/28/2017 12:03   [place] Scrotom Doppler  Result Date: 01/28/2017 CLINICAL DATA:  Since right testicle pain for 1 day.  Fever. EXAM: SCROTAL ULTRASOUND DOPPLER ULTRASOUND OF THE TESTICLES TECHNIQUE: Complete ultrasound examination of the testicles, epididymis, and other scrotal structures was performed. Color and spectral Doppler ultrasound were also utilized to evaluate blood flow to the testicles. COMPARISON:  None. FINDINGS: Right testicle Measurements: 3.8 x 2.3 x 2.8 cm. A complex cystic lesion is present within the parenchyma of the right testicle measuring 0.9 x 0.5 x 0.8 cm. The right testicle is diffusely increased in vascularity. Left testicle Measurements: 3.9 x 2.3 x 2.5 cm. No mass or microlithiasis visualized. Right epididymis: There is increased vascularity throughout the right epididymis. Left epididymis: Normal vascularity. There is an epididymal cyst measuring 0.9 x 0.7 x 0.6 cm. Hydrocele:  A right hydrocele is present with septations. Varicocele: Mild left varicocele is present. Veins measure up to 2 mm. Pulsed Doppler interrogation of both testes demonstrates normal low resistance arterial and venous waveforms bilaterally. IMPRESSION: No evidence of testicular torsion. There is increased vascularity within the right testicle and epididymis consistent with epididymo-orchitis. There is a complex cystic lesion in the right testicle measuring up 0.9 cm. Malignancy is not excluded. Urology consultation is recommended. Benign left epididymal cyst. Loculated right hydrocele worrisome foreign inflammatory process. Mild left varicocele. Electronically Signed   By: Marybelle Killings M.D.   On: 01/28/2017 12:03     Labs:   Basic Metabolic Panel: Recent Labs  Lab 01/28/17 1018 01/29/17 0120 01/30/17 0627  NA 135 131* 135  K 4.1 4.0 4.0  CL 103  102 106  CO2 24 22 23   GLUCOSE 115* 109* 168*  BUN 12 11 9   CREATININE 1.05 0.97 0.83  CALCIUM 9.1 8.3* 8.4*   GFR Estimated Creatinine Clearance: 83.4 mL/min (by C-G formula based on SCr of 0.83 mg/dL). Liver Function Tests: Recent Labs  Lab 01/29/17 0120  AST 37  ALT 29  ALKPHOS 96  BILITOT 0.9  PROT 6.4*  ALBUMIN 3.0*   No results for input(s): LIPASE, AMYLASE in the last 168 hours. No results for input(s): AMMONIA in the last 168 hours. Coagulation profile Recent Labs  Lab 01/29/17 0120  INR 1.10    CBC: Recent Labs  Lab 01/28/17 1018 01/29/17 0120 01/30/17 0627  WBC 13.9* 9.8 7.6  NEUTROABS 11.1*  --   --   HGB 14.4 13.4 11.9*  HCT 42.8 39.7 36.3*  MCV 92.0 91.5 91.4  PLT 187 199 187   Cardiac Enzymes: No results for input(s): CKTOTAL, CKMB, CKMBINDEX, TROPONINI in the last 168 hours. BNP: Invalid input(s): POCBNP CBG: No results for input(s): GLUCAP in the last 168 hours. D-Dimer No results for input(s): DDIMER in the last 72 hours. Hgb A1c Recent Labs    01/29/17 0120  HGBA1C 5.9*   Lipid Profile No results for input(s): CHOL, HDL, LDLCALC, TRIG, CHOLHDL, LDLDIRECT in the last 72 hours. Thyroid function studies No results for input(s): TSH, T4TOTAL, T3FREE, THYROIDAB in the last 72 hours.  Invalid input(s): FREET3 Anemia work up No results for  input(s): VITAMINB12, FOLATE, FERRITIN, TIBC, IRON, RETICCTPCT in the last 72 hours. Microbiology Recent Results (from the past 240 hour(s))  Urine culture     Status: Abnormal   Collection Time: 01/28/17 12:46 PM  Result Value Ref Range Status   Specimen Description URINE, CLEAN CATCH  Final   Special Requests NONE  Final   Culture >=100,000 COLONIES/mL ESCHERICHIA COLI (A)  Final   Report Status 01/30/2017 FINAL  Final   Organism ID, Bacteria ESCHERICHIA COLI (A)  Final      Susceptibility   Escherichia coli - MIC*    AMPICILLIN 16 INTERMEDIATE Intermediate     CEFAZOLIN <=4 SENSITIVE  Sensitive     CEFTRIAXONE <=1 SENSITIVE Sensitive     CIPROFLOXACIN >=4 RESISTANT Resistant     GENTAMICIN <=1 SENSITIVE Sensitive     IMIPENEM <=0.25 SENSITIVE Sensitive     NITROFURANTOIN <=16 SENSITIVE Sensitive     TRIMETH/SULFA >=320 RESISTANT Resistant     AMPICILLIN/SULBACTAM 4 SENSITIVE Sensitive     PIP/TAZO <=4 SENSITIVE Sensitive     Extended ESBL NEGATIVE Sensitive     * >=100,000 COLONIES/mL ESCHERICHIA COLI  Culture, blood (Routine X 2) w Reflex to ID Panel     Status: None (Preliminary result)   Collection Time: 01/29/17  1:20 AM  Result Value Ref Range Status   Specimen Description BLOOD LEFT ANTECUBITAL  Final   Special Requests   Final    BOTTLES DRAWN AEROBIC AND ANAEROBIC Blood Culture adequate volume   Culture NO GROWTH 1 DAY  Final   Report Status PENDING  Incomplete  Culture, blood (Routine X 2) w Reflex to ID Panel     Status: None (Preliminary result)   Collection Time: 01/29/17  1:22 AM  Result Value Ref Range Status   Specimen Description BLOOD RIGHT HAND  Final   Special Requests IN PEDIATRIC BOTTLE Blood Culture adequate volume  Final   Culture NO GROWTH 1 DAY  Final   Report Status PENDING  Incomplete     Discharge Instructions:   Discharge Instructions    Diet - low sodium heart healthy   Complete by:  As directed    Increase activity slowly   Complete by:  As directed      Allergies as of 01/31/2017   No Known Allergies     Medication List    TAKE these medications   aspirin 81 MG tablet Take 81 mg by mouth daily.   cefUROXime 500 MG tablet Commonly known as:  CEFTIN Take 1 tablet (500 mg total) 2 (two) times daily with a meal by mouth.   Fish Oil 1200 MG Caps Take 1,200 mg by mouth daily.   omeprazole 40 MG capsule Commonly known as:  PRILOSEC TAKE 1 CAPSULE BY MOUTH EVERY DAY   oxymetazoline 0.05 % nasal spray Commonly known as:  AFRIN Place 2 sprays 2 (two) times daily as needed into both nostrils for congestion.     rosuvastatin 10 MG tablet Commonly known as:  CRESTOR TAKE 1 TABLET BY MOUTH EVERY DAY   vitamin B-12 1000 MCG tablet Commonly known as:  CYANOCOBALAMIN Take 1,000-5,000 mcg by mouth daily.   vitamin E 400 UNIT capsule Take 1,200 Units by mouth daily.      Follow-up Information    [name], MD Follow up in 1 week(s).   Specialty:  Family Medicine Contact information: [place] [address] [phone]        Irine Seal, MD Follow  up.   Specialty:  Urology Why:  at schedule appointment Contact information: [place] [address] [phone]            Time coordinating discharge: 35 min  Signed:  Dara Camargo U [place]   [facility] 01/31/2017, 8:28 AM

## 2017-02-01 ENCOUNTER — Telehealth: Payer: Self-pay | Admitting: Emergency Medicine

## 2017-02-01 NOTE — Telephone Encounter (Signed)
Spoke with patient daughter and she states that patient right testicle is still inflamed and also patient is running a fever but only at night. Per Dr. Volanda Napoleon patient needs to head to the ED in case patient needs IV antibiotics.Patient daughter understood and had no further questions.

## 2017-02-02 ENCOUNTER — Telehealth: Payer: Self-pay | Admitting: *Deleted

## 2017-02-02 NOTE — Telephone Encounter (Signed)
Transition Care Management Follow-up Telephone Call  Per Discharge Summary: Admit date: 01/28/2017 Discharge date: 01/31/2017   Recommendations for Outpatient Follow-Up:   1. Close follow up with Dr. Jeffie Pollock- Urology to ensure resolution   Discharge Diagnosis:   Active Problems:   Epididymitis   Discharge disposition:  Home  Discharge Condition: Improved.  Diet recommendation: Low sodium, heart healthy.  Wound care: None.  --  Call completed w/ patient's daughter, [name] (on [place]).  How have you been since you were released from the hospital? "He's still swollen, but doctor at hospital told him it could take 6-8 weeks to be back to normal. The first night he hurt pretty bad, but felt like he was just on his feet too much. Last night used some ice on testicle and that helped. Today he is trying to take it easy." Daughter reports he was running a low grade at night while in the hospital and continued to do so his first night home. He took PO Tylenol at bedtime last night and does not think he ran a fever. Daughter is concerned about him continuing to run a low grade fever at night only. Advised her he should continue to take antibiotics as directed and keep scheduled follow-up appointments. If he develops worsening fever, severe pain, confusion, weakness, N/V, or other sign of worsening infection, he should return to ED immediately. Daughter verbalized understanding of instructions and agreed. Please advise if any additional recommendations.   Do you understand why you were in the hospital? yes   Do you understand the discharge instructions? yes   Where were you discharged to? Home   Items Reviewed:  Medications reviewed: yes  Allergies reviewed: yes  Dietary changes reviewed: no  Referrals reviewed: yes, urology   Functional Questionnaire:   Activities of Daily Living (ADLs):   He states they are independent in the following: ambulation, bathing and  hygiene, feeding, continence, grooming, toileting and dressing States they require assistance with the following: none   Any transportation issues/concerns?: no   Any patient concerns? Yes, see above.   Confirmed importance and date/time of follow-up visits scheduled yes  Provider Appointment booked with Dr. Carolann Littler 02/06/17 @ 3:45pm  Confirmed with patient if condition begins to worsen call PCP or go to the ER.  Patient was given the office number and encouraged to call back with question or concerns.  : yes

## 2017-02-03 LAB — CULTURE, BLOOD (ROUTINE X 2)
Culture: NO GROWTH
Culture: NO GROWTH
SPECIAL REQUESTS: ADEQUATE
SPECIAL REQUESTS: ADEQUATE

## 2017-02-05 ENCOUNTER — Encounter: Payer: Self-pay | Admitting: Family Medicine

## 2017-02-06 ENCOUNTER — Ambulatory Visit: Payer: Medicare Other | Admitting: Family Medicine

## 2017-02-06 ENCOUNTER — Encounter: Payer: Self-pay | Admitting: Family Medicine

## 2017-02-06 VITALS — BP 138/80 | HR 68 | Temp 98.4°F | Wt 175.2 lb

## 2017-02-06 DIAGNOSIS — E785 Hyperlipidemia, unspecified: Secondary | ICD-10-CM | POA: Diagnosis not present

## 2017-02-06 DIAGNOSIS — N453 Epididymo-orchitis: Secondary | ICD-10-CM

## 2017-02-06 DIAGNOSIS — K219 Gastro-esophageal reflux disease without esophagitis: Secondary | ICD-10-CM | POA: Diagnosis not present

## 2017-02-06 NOTE — Patient Instructions (Signed)
Finish out the antibiotic Follow up for any recurrent fever or increased swelling/pain.

## 2017-02-06 NOTE — Progress Notes (Signed)
Subjective:     Patient ID: Martin Navarro, male   DOB: 05/17/1950, 66 y.o.   MRN: [id number]  HPI Patient seen for hospital follow-up. He had been seen here in our practice on November 2 with diagnosis of epididymitis and treated with Levaquin 500 milligrams daily. He continued to have progressive right testicular pain and swelling along with some fever and went to the hospital for further evaluation. He had recent prostatitis which require hospitalization back in June.  Urine culture grew out greater than 100,000 colonies of Escherichia coli. Ultrasound scrotum showed epididymoorchitis. Was treated with broad spectrum antibiotic with cefepime and doxycycline and and then discharged on Ceftin 500 mg twice a day for total of 10 days. His white blood cell count was trending down during hospitalization. Blood cultures were negative.  Overall, he feels improved. He felt he had some low-grade fevers until 2 nights ago. No chills. No dysuria. Scrotal swelling is slightly improved. Denies any recent obstructive urinary symptoms. Patient also had scrotum Doppler which showed no evidence for testicular torsion. Patient has follow-up with urology scheduled for early December. Denies any side effects from medication  Patient's other medical problems include hyperlipidemia treated with Crestor and history of GERD which is currently stable on omeprazole. No dysphagia.  Past Medical History:  Diagnosis Date  . Asthma   . Emphysema of lung ([place])   . Erectile dysfunction   . Hyperlipidemia   . Prostatitis    No past surgical history on file.  reports that he quit smoking about 2 years ago. His smoking use included e-cigarettes. He has a 106.00 pack-year smoking history. He uses smokeless tobacco. He reports that he does not drink alcohol. His drug history is not on file. family history includes Heart disease (age of onset: 65) in his father; Hypertension in his father. No Known Allergies   Review of  Systems  Constitutional: Negative for chills and fever.  Respiratory: Negative for shortness of breath.   Cardiovascular: Negative for chest pain.  Gastrointestinal: Negative for abdominal pain.  Genitourinary: Positive for scrotal swelling. Negative for decreased urine volume, discharge, dysuria, flank pain and hematuria.  Musculoskeletal: Negative for back pain.  Neurological: Negative for weakness.       Objective:   Physical Exam  Constitutional: He appears well-developed and well-nourished.  Cardiovascular: Normal rate and regular rhythm.  Pulmonary/Chest: Effort normal and breath sounds normal. No respiratory distress. He has no wheezes. He has no rales.  Genitourinary:  Genitourinary Comments: Patient still has significant swelling of the right testicle and epididymis region but minimally tender. Left testicle is normal. No hernia.  Neurological: He is alert.       Assessment:     #1 right epididymo-orchitis-gradually improving with antibiotics  #2 hyperlipidemia on Crestor  #3 GERD stable    Plan:     -Finish out cefuroxime -Follow-up immediately for any recurrent fever or increased swelling or pain -Keep follow-up with urology as scheduled -Recheck CBC with differential  [name] MD [facility]

## 2017-02-07 LAB — CBC WITH DIFFERENTIAL/PLATELET
Basophils Absolute: 0 10*3/uL (ref 0.0–0.1)
Basophils Relative: 0.2 % (ref 0.0–3.0)
EOS PCT: 2 % (ref 0.0–5.0)
Eosinophils Absolute: 0.2 10*3/uL (ref 0.0–0.7)
HEMATOCRIT: 39.5 % (ref 39.0–52.0)
Hemoglobin: 13 g/dL (ref 13.0–17.0)
LYMPHS PCT: 18.7 % (ref 12.0–46.0)
Lymphs Abs: 2.2 10*3/uL (ref 0.7–4.0)
MCHC: 33 g/dL (ref 30.0–36.0)
MCV: 92.8 fl (ref 78.0–100.0)
MONOS PCT: 9.1 % (ref 3.0–12.0)
Monocytes Absolute: 1.1 10*3/uL — ABNORMAL HIGH (ref 0.1–1.0)
Neutro Abs: 8.3 10*3/uL — ABNORMAL HIGH (ref 1.4–7.7)
Neutrophils Relative %: 70 % (ref 43.0–77.0)
Platelets: 332 10*3/uL (ref 150.0–400.0)
RBC: 4.25 Mil/uL (ref 4.22–5.81)
RDW: 14 % (ref 11.5–15.5)
WBC: 11.9 10*3/uL — AB (ref 4.0–10.5)

## 2017-02-28 DIAGNOSIS — N451 Epididymitis: Secondary | ICD-10-CM | POA: Diagnosis not present

## 2017-04-12 DIAGNOSIS — N442 Benign cyst of testis: Secondary | ICD-10-CM | POA: Diagnosis not present

## 2017-04-12 DIAGNOSIS — N451 Epididymitis: Secondary | ICD-10-CM | POA: Diagnosis not present

## 2017-04-24 DIAGNOSIS — D485 Neoplasm of uncertain behavior of skin: Secondary | ICD-10-CM | POA: Diagnosis not present

## 2017-07-09 ENCOUNTER — Other Ambulatory Visit: Payer: Self-pay | Admitting: Family Medicine

## 2017-07-11 ENCOUNTER — Encounter: Payer: Self-pay | Admitting: Family Medicine

## 2017-07-11 ENCOUNTER — Ambulatory Visit (INDEPENDENT_AMBULATORY_CARE_PROVIDER_SITE_OTHER): Payer: Medicare Other | Admitting: Family Medicine

## 2017-07-11 VITALS — BP 118/70 | HR 76 | Temp 98.4°F | Ht 65.0 in | Wt 177.2 lb

## 2017-07-11 DIAGNOSIS — H6123 Impacted cerumen, bilateral: Secondary | ICD-10-CM | POA: Diagnosis not present

## 2017-07-11 NOTE — Progress Notes (Signed)
Subjective:     Patient ID: Martin Navarro, male   DOB: 1950-06-19, 67 y.o.   MRN: [id number]  HPI Patient presents with onset about 3 weeks ago of feeling sensation of fullness in both ears. He does use Q-tips frequently. 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 No pain. No drainage. No sinus or nasal congestion. No fevers or chills. No sudden hearing changes.  Past Medical History:  Diagnosis Date  . Asthma   . Emphysema of lung ([place])   . Erectile dysfunction   . Hyperlipidemia   . Prostatitis    History reviewed. No pertinent surgical history.  reports that he quit smoking about 2 years ago. His smoking use included e-cigarettes. He has a 106.00 pack-year smoking history. He uses smokeless tobacco. He reports that he does not drink alcohol. His drug history is not on file. family history includes Heart disease (age of onset: 35) in his father; Hypertension in his father. No Known Allergies   Review of Systems  Constitutional: Negative for chills and fever.  HENT: Negative for congestion, ear discharge, ear pain and sinus pressure.        Objective:   Physical Exam  Constitutional: He appears well-developed and well-nourished.  HENT:  Patient has moderate cerumen both canals. Mostly obscuring eardrums but not totally impacted  Cardiovascular: Normal rate and regular rhythm.  Pulmonary/Chest: Effort normal and breath sounds normal. No respiratory distress. He has no wheezes. He has no rales.       Assessment:     Bilateral cerumen.  Uses hearing aids chronically    Plan:     -Recommend irrigation -ear canals cleared of cerumen after irrigation.  [name] MD [facility] at [facility]

## 2017-07-18 ENCOUNTER — Ambulatory Visit (INDEPENDENT_AMBULATORY_CARE_PROVIDER_SITE_OTHER): Payer: Medicare Other | Admitting: Family Medicine

## 2017-07-18 ENCOUNTER — Encounter: Payer: Self-pay | Admitting: Family Medicine

## 2017-07-18 VITALS — BP 108/64 | HR 101 | Temp 99.9°F | Ht 65.0 in | Wt 178.9 lb

## 2017-07-18 DIAGNOSIS — H60501 Unspecified acute noninfective otitis externa, right ear: Secondary | ICD-10-CM | POA: Diagnosis not present

## 2017-07-18 MED ORDER — NEOMYCIN-POLYMYXIN-HC 1 % OT SOLN: 3.0000 [drp] | Freq: Four times a day (QID) | OTIC | 0 refills | Status: DC

## 2017-07-18 MED ORDER — CIPROFLOXACIN-DEXAMETHASONE 0.3-0.1 % OT SUSP: 4.0000 [drp] | Freq: Two times a day (BID) | OTIC | 1 refills | Status: DC

## 2017-07-18 NOTE — Progress Notes (Signed)
Subjective:     Patient ID: Martin Navarro, male   DOB: 1951-02-22, 67 y.o.   MRN: [id number]  HPI Patient seen with right ear pain.  Was seen here recently and had bilateral cerumen impactions with ear irrigation. His left ear feels fine. He's noticed some increasing right ear pain a few days ago which has progressed. He tried to irrigate this with water himself. He's had some occasional drainage. No fevers or chills. Pain is moderate to severe. No bloody discharge. No known drug allergies.  Past Medical History:  Diagnosis Date  . Asthma   . Emphysema of lung ([place])   . Erectile dysfunction   . Hyperlipidemia   . Prostatitis    No past surgical history on file.  reports that he quit smoking about 2 years ago. His smoking use included e-cigarettes. He has a 106.00 pack-year smoking history. He uses smokeless tobacco. He reports that he does not drink alcohol. His drug history is not on file. family history includes Heart disease (age of onset: 74) in his father; Hypertension in his father. No Known Allergies   Review of Systems  Constitutional: Negative for chills and fever.  HENT: Positive for ear pain. Negative for ear discharge.        Objective:   Physical Exam  Constitutional: He appears well-developed and well-nourished.  HENT:  Left ear canal and eardrum appear normal  Right ear canal is very edematous and some mild erythema. No visible exudate. Minimal debris within the canal. Eardrum not fully visualized  Cardiovascular: Normal rate.       Assessment:     Right otitis externa    Plan:     -Keep ear dry as possible. -Ciprodex eardrops 4 drops right ear twice a day -Touch base if not improving in 8-10 days  [name] MD [facility] at [facility]

## 2017-07-18 NOTE — Patient Instructions (Signed)

## 2017-07-23 ENCOUNTER — Encounter: Payer: Self-pay | Admitting: Family Medicine

## 2017-07-23 MED ORDER — OMEPRAZOLE 40 MG PO CPDR: DELAYED_RELEASE_CAPSULE | ORAL | 1 refills | Status: DC

## 2017-07-23 MED ORDER — ROSUVASTATIN CALCIUM 10 MG PO TABS: 10.0000 mg | ORAL_TABLET | Freq: Every day | ORAL | 1 refills | Status: DC

## 2017-07-25 DIAGNOSIS — N442 Benign cyst of testis: Secondary | ICD-10-CM | POA: Diagnosis not present

## 2017-07-25 DIAGNOSIS — N453 Epididymo-orchitis: Secondary | ICD-10-CM | POA: Diagnosis not present

## 2017-08-17 ENCOUNTER — Ambulatory Visit (INDEPENDENT_AMBULATORY_CARE_PROVIDER_SITE_OTHER): Payer: Medicare Other | Admitting: Family Medicine

## 2017-08-17 ENCOUNTER — Encounter: Payer: Self-pay | Admitting: Family Medicine

## 2017-08-17 VITALS — BP 102/70 | HR 75 | Temp 97.4°F | Wt 175.1 lb

## 2017-08-17 DIAGNOSIS — R1032 Left lower quadrant pain: Secondary | ICD-10-CM | POA: Diagnosis not present

## 2017-08-17 LAB — CBC WITH DIFFERENTIAL/PLATELET
BASOS ABS: 0.1 10*3/uL (ref 0.0–0.1)
Basophils Relative: 0.7 % (ref 0.0–3.0)
EOS PCT: 2.1 % (ref 0.0–5.0)
Eosinophils Absolute: 0.2 10*3/uL (ref 0.0–0.7)
HCT: 41.6 % (ref 39.0–52.0)
HEMOGLOBIN: 14.2 g/dL (ref 13.0–17.0)
LYMPHS ABS: 2 10*3/uL (ref 0.7–4.0)
Lymphocytes Relative: 19.6 % (ref 12.0–46.0)
MCHC: 34.2 g/dL (ref 30.0–36.0)
MCV: 92.3 fl (ref 78.0–100.0)
MONO ABS: 1 10*3/uL (ref 0.1–1.0)
MONOS PCT: 10 % (ref 3.0–12.0)
Neutro Abs: 7 10*3/uL (ref 1.4–7.7)
Neutrophils Relative %: 67.6 % (ref 43.0–77.0)
Platelets: 215 10*3/uL (ref 150.0–400.0)
RBC: 4.5 Mil/uL (ref 4.22–5.81)
RDW: 13.7 % (ref 11.5–15.5)
WBC: 10.4 10*3/uL (ref 4.0–10.5)

## 2017-08-17 LAB — POCT URINALYSIS DIPSTICK
Bilirubin, UA: NEGATIVE
GLUCOSE UA: NEGATIVE
Ketones, UA: NEGATIVE
LEUKOCYTES UA: NEGATIVE
NITRITE UA: NEGATIVE
PH UA: 6 (ref 5.0–8.0)
PROTEIN UA: NEGATIVE
Spec Grav, UA: 1.02 (ref 1.010–1.025)
Urobilinogen, UA: 0.2 E.U./dL

## 2017-08-17 MED ORDER — CIPROFLOXACIN HCL 500 MG PO TABS: 500.0000 mg | ORAL_TABLET | Freq: Two times a day (BID) | ORAL | 0 refills | Status: DC

## 2017-08-17 MED ORDER — METRONIDAZOLE 500 MG PO TABS: 500.0000 mg | ORAL_TABLET | Freq: Three times a day (TID) | ORAL | 0 refills | Status: DC

## 2017-08-17 NOTE — Progress Notes (Signed)
  Subjective:     Patient ID: Martin Navarro, male   DOB: May 03, 1950, 67 y.o.   MRN: [id number]  HPI Patient seen with lower abdominal pain past few days. Pain is near the midline. Somewhat intermittent. He has had some recent mild constipation. Somewhat of a bloated feeling intermittently. He had colonoscopy 2016 with some diverticulosis changes. No known history of diverticulitis. No burning with urination. Denies any fever, bloody stools, back pain. No recent injury. Pain somewhat worse with coughing.  Past Medical History:  Diagnosis Date  . Asthma   . Emphysema of lung ([place])   . Erectile dysfunction   . Hyperlipidemia   . Prostatitis    No past surgical history on file.  reports that he quit smoking about 2 years ago. His smoking use included e-cigarettes. He has a 106.00 pack-year smoking history. He uses smokeless tobacco. He reports that he does not drink alcohol. His drug history is not on file. family history includes Heart disease (age of onset: 4) in his father; Hypertension in his father. No Known Allergies   Review of Systems  Constitutional: Negative for chills and fever.  Respiratory: Negative for shortness of breath.   Cardiovascular: Negative for chest pain.  Gastrointestinal: Positive for abdominal pain. Negative for blood in stool, constipation, nausea and vomiting.  Genitourinary: Negative for difficulty urinating and dysuria.  Skin: Negative for rash.  Neurological: Negative for dizziness and weakness.       Objective:   Physical Exam  Constitutional: He appears well-developed and well-nourished.  HENT:  Mouth/Throat: Oropharynx is clear and moist.  Abdominal: Normal appearance and bowel sounds are normal. There is no hepatosplenomegaly. There is no rigidity, no rebound and no guarding.  Tender left lower quadrant to deep palpation. No guarding or rebound. No masses. No right lower quadrant tenderness.  Skin: No rash noted.       Assessment:     Left  lower quadrant abdominal pain. Question acute diverticulitis. He has not had any fever but does have some tenderness around the sigmoid region. Nonacute abdomen.    Plan:     -Check urine dipstick and CBC with differential -Consider. Coverage with Cipro 500 mg twice a day and Flagyl 500 mg 3 times a day for the next 10 days -Follow-up immediately for increased fever, vomiting, or worsening abdominal pain -CT abdomen and pelvis for any persistent or worsening pain.  [name] MD [facility] at [facility]

## 2017-08-17 NOTE — Patient Instructions (Signed)
Diverticulitis °Diverticulitis is infection or inflammation of small pouches (diverticula) in the colon that form due to a condition called diverticulosis. Diverticula can trap stool (feces) and bacteria, causing infection and inflammation. °Diverticulitis may cause severe stomach pain and diarrhea. It may lead to tissue damage in the colon that causes bleeding. The diverticula may also burst (rupture) and cause infected stool to enter other areas of the abdomen. °Complications of diverticulitis can include: °· Bleeding. °· Severe infection. °· Severe pain. °· Rupture (perforation) of the colon. °· Blockage (obstruction) of the colon. ° °What are the causes? °This condition is caused by stool becoming trapped in the diverticula, which allows bacteria to grow in the diverticula. This leads to inflammation and infection. °What increases the risk? °You are more likely to develop this condition if: °· You have diverticulosis. The risk for diverticulosis increases if: °? You are overweight or obese. °? You use tobacco products. °? You do not get enough exercise. °· You eat a diet that does not include enough fiber. High-fiber foods include fruits, vegetables, beans, nuts, and whole grains. ° °What are the signs or symptoms? °Symptoms of this condition may include: °· Pain and tenderness in the abdomen. The pain is normally located on the left side of the abdomen, but it may occur in other areas. °· Fever and chills. °· Bloating. °· Cramping. °· Nausea. °· Vomiting. °· Changes in bowel routines. °· Blood in your stool. ° °How is this diagnosed? °This condition is diagnosed based on: °· Your medical history. °· A physical exam. °· Tests to make sure there is nothing else causing your condition. These tests may include: °? Blood tests. °? Urine tests. °? Imaging tests of the abdomen, including X-rays, ultrasounds, MRIs, or CT scans. ° °How is this treated? °Most cases of this condition are mild and can be treated at home.  Treatment may include: °· Taking over-the-counter pain medicines. °· Following a clear liquid diet. °· Taking antibiotic medicines by mouth. °· Rest. ° °More severe cases may need to be treated at a hospital. Treatment may include: °· Not eating or drinking. °· Taking prescription pain medicine. °· Receiving antibiotic medicines through an IV tube. °· Receiving fluids and nutrition through an IV tube. °· Surgery. ° °When your condition is under control, your health care provider may recommend that you have a colonoscopy. This is an exam to look at the entire large intestine. During the exam, a lubricated, bendable tube is inserted into the anus and then passed into the rectum, colon, and other parts of the large intestine. A colonoscopy can show how severe your diverticula are and whether something else may be causing your symptoms. °Follow these instructions at home: °Medicines °· Take over-the-counter and prescription medicines only as told by your health care provider. These include fiber supplements, probiotics, and stool softeners. °· If you were prescribed an antibiotic medicine, take it as told by your health care provider. Do not stop taking the antibiotic even if you start to feel better. °· Do not drive or use heavy machinery while taking prescription pain medicine. °General instructions °· Follow a full liquid diet or another diet as directed by your health care provider. After your symptoms improve, your health care provider may tell you to change your diet. He or she may recommend that you eat a diet that contains at least 25 g (25 grams) of fiber daily. Fiber makes it easier to pass stool. Healthy sources of fiber include: °? Berries. One cup   contains 4-8 grams of fiber. °? Beans or lentils. One half cup contains 5-8 grams of fiber. °? Green vegetables. One cup contains 4 grams of fiber. °· Exercise for at least 30 minutes, 3 times each week. You should exercise hard enough to raise your heart rate and  break a sweat. °· Keep all follow-up visits as told by your health care provider. This is important. You may need a colonoscopy. °Contact a health care provider if: °· Your pain does not improve. °· You have a hard time drinking or eating food. °· Your bowel movements do not return to normal. °Get help right away if: °· Your pain gets worse. °· Your symptoms do not get better with treatment. °· Your symptoms suddenly get worse. °· You have a fever. °· You vomit more than one time. °· You have stools that are bloody, black, or tarry. °Summary °· Diverticulitis is infection or inflammation of small pouches (diverticula) in the colon that form due to a condition called diverticulosis. Diverticula can trap stool (feces) and bacteria, causing infection and inflammation. °· You are at higher risk for this condition if you have diverticulosis and you eat a diet that does not include enough fiber. °· Most cases of this condition are mild and can be treated at home. More severe cases may need to be treated at a hospital. °· When your condition is under control, your health care provider may recommend that you have an exam called a colonoscopy. This exam can show how severe your diverticula are and whether something else may be causing your symptoms. °This information is not intended to replace advice given to you by your health care provider. Make sure you discuss any questions you have with your health care provider. °Document Released: 12/21/2004 Document Revised: 04/15/2016 Document Reviewed: 04/15/2016 °Elsevier Interactive Patient Education © 2018 [employer] ° °

## 2017-11-20 ENCOUNTER — Encounter: Payer: Self-pay | Admitting: Family Medicine

## 2017-11-20 ENCOUNTER — Ambulatory Visit (INDEPENDENT_AMBULATORY_CARE_PROVIDER_SITE_OTHER): Payer: Medicare Other | Admitting: Family Medicine

## 2017-11-20 VITALS — BP 140/80 | HR 80 | Temp 97.6°F | Ht 64.0 in | Wt 174.9 lb

## 2017-11-20 DIAGNOSIS — Z0000 Encounter for general adult medical examination without abnormal findings: Secondary | ICD-10-CM | POA: Diagnosis not present

## 2017-11-20 LAB — CBC WITH DIFFERENTIAL/PLATELET
BASOS ABS: 0.1 10*3/uL (ref 0.0–0.1)
Basophils Relative: 0.8 % (ref 0.0–3.0)
EOS PCT: 3 % (ref 0.0–5.0)
Eosinophils Absolute: 0.3 10*3/uL (ref 0.0–0.7)
HCT: 44.7 % (ref 39.0–52.0)
HEMOGLOBIN: 15.2 g/dL (ref 13.0–17.0)
Lymphocytes Relative: 26.6 % (ref 12.0–46.0)
Lymphs Abs: 2.6 10*3/uL (ref 0.7–4.0)
MCHC: 33.9 g/dL (ref 30.0–36.0)
MCV: 93 fl (ref 78.0–100.0)
Monocytes Absolute: 1 10*3/uL (ref 0.1–1.0)
Monocytes Relative: 10.2 % (ref 3.0–12.0)
Neutro Abs: 5.7 10*3/uL (ref 1.4–7.7)
Neutrophils Relative %: 59.4 % (ref 43.0–77.0)
Platelets: 227 10*3/uL (ref 150.0–400.0)
RBC: 4.81 Mil/uL (ref 4.22–5.81)
RDW: 13.8 % (ref 11.5–15.5)
WBC: 9.6 10*3/uL (ref 4.0–10.5)

## 2017-11-20 LAB — LIPID PANEL
Cholesterol: 145 mg/dL (ref 0–200)
HDL: 42.2 mg/dL (ref >39.00)
LDL Cholesterol: 82 mg/dL (ref 0–99)
NonHDL: 102.93
Total CHOL/HDL Ratio: 3
Triglycerides: 104 mg/dL (ref 0.0–149.0)
VLDL: 20.8 mg/dL (ref 0.0–40.0)

## 2017-11-20 LAB — BASIC METABOLIC PANEL
BUN: 13 mg/dL (ref 6–23)
CALCIUM: 9.6 mg/dL (ref 8.4–10.5)
CO2: 30 meq/L (ref 19–32)
CREATININE: 0.91 mg/dL (ref 0.40–1.50)
Chloride: 102 mEq/L (ref 96–112)
GFR: 87.94 mL/min (ref >60.00)
GLUCOSE: 98 mg/dL (ref 70–99)
Potassium: 4.7 mEq/L (ref 3.5–5.1)
Sodium: 137 mEq/L (ref 135–145)

## 2017-11-20 LAB — PSA: PSA: 0.77 ng/mL (ref 0.10–4.00)

## 2017-11-20 LAB — HEPATIC FUNCTION PANEL
ALBUMIN: 4.1 g/dL (ref 3.5–5.2)
ALK PHOS: 61 U/L (ref 39–117)
ALT: 11 U/L (ref 0–53)
AST: 13 U/L (ref 0–37)
BILIRUBIN DIRECT: 0.1 mg/dL (ref 0.0–0.3)
TOTAL PROTEIN: 6.9 g/dL (ref 6.0–8.3)
Total Bilirubin: 0.8 mg/dL (ref 0.2–1.2)

## 2017-11-20 LAB — TSH: TSH: 1.87 u[IU]/mL (ref 0.35–4.50)

## 2017-11-20 NOTE — Progress Notes (Signed)
Subjective:     Patient ID: Martin Navarro, male   DOB: 1950-09-20, 67 y.o.   MRN: [id number]  HPI Patient seen for physical exam. He has long history of smoking and is currently smoking "vapes "  He had low-dose CT lung cancer screening last year with no acute findings. He will be due for one year follow-up this September. He's had previous CT abdomen back about a year ago with no evidence for abdominal aortic aneurysm. He had previous shingles vaccine but not Shingrix.  Colonoscopy 01/06/15 was recommended 10 her follow-up Tetanus booster 10/25/10 Patient declines flu vaccine Pneumonia vaccinations up-to-date Previous hepatitis C screening last year normal  Past Medical History:  Diagnosis Date  . Asthma   . Emphysema of lung ([place])   . Erectile dysfunction   . Hyperlipidemia   . Prostatitis    No past surgical history on file.  reports that he quit smoking about 2 years ago. His smoking use included e-cigarettes. He has a 106.00 pack-year smoking history. He uses smokeless tobacco. He reports that he does not drink alcohol. His drug history is not on file. family history includes Heart disease (age of onset: 72) in his father; Hypertension in his father. No Known Allergies    Review of Systems  Constitutional: Negative for activity change, appetite change, fatigue, fever and unexpected weight change.  HENT: Negative for congestion, ear pain and trouble swallowing.   Eyes: Negative for pain and visual disturbance.  Respiratory: Negative for cough, shortness of breath and wheezing.   Cardiovascular: Negative for chest pain and palpitations.  Gastrointestinal: Positive for constipation. Negative for abdominal distention, abdominal pain, blood in stool, diarrhea, nausea, rectal pain and vomiting.  Genitourinary: Negative for dysuria, hematuria and testicular pain.  Musculoskeletal: Negative for arthralgias and joint swelling.  Skin: Negative for rash.  Neurological: Negative for  dizziness, syncope and headaches.  Hematological: Negative for adenopathy.  Psychiatric/Behavioral: Negative for confusion and dysphoric mood.       Objective:   Physical Exam  Constitutional: He is oriented to person, place, and time. He appears well-developed and well-nourished. No distress.  HENT:  Head: Normocephalic and atraumatic.  Right Ear: External ear normal.  Left Ear: External ear normal.  Mouth/Throat: Oropharynx is clear and moist.  Eyes: Pupils are equal, round, and reactive to light. Conjunctivae and EOM are normal.  Neck: Normal range of motion. Neck supple. No thyromegaly present.  Cardiovascular: Normal rate, regular rhythm and normal heart sounds.  No murmur heard. Pulmonary/Chest: No respiratory distress. He has no wheezes. He has no rales.  Abdominal: Soft. Bowel sounds are normal. He exhibits no distension and no mass. There is no tenderness. There is no rebound and no guarding.  Musculoskeletal: He exhibits no edema.  Lymphadenopathy:    He has no cervical adenopathy.  Neurological: He is alert and oriented to person, place, and time. He displays normal reflexes. No cranial nerve deficit.  Skin: No rash noted.  Psychiatric: He has a normal mood and affect.       Assessment:     Physical exam. Several health maintenance issues addressed as below. He has some chronic constipation but very little water intake and probably an adequate fiber intake    Plan:     -recommend continue with yearly low-dose CT lung cancer screening -Colonoscopy up-to-date -Patient declines flu vaccination -No indication for abdominal aortic aneurysm screening given recent CT scan above -Discussed no shingles vaccine and he will check with insurance coverage -Pneumonia vaccinations  up-to-date -Previous hepatitis C antibody negative -Obtain lab work. The natural history of prostate cancer and ongoing controversy regarding screening and potential treatment outcomes of prostate  cancer has been discussed with the patient. The meaning of a false positive PSA and a false negative PSA has been discussed. He indicates understanding of the limitations of this screening test and wishes to proceed with screening PSA testing.  [name] MD [facility] at [facility]

## 2017-11-20 NOTE — Patient Instructions (Signed)
Consider new shingles vaccine (Shingrix) and check on insurance coverage if interested  Consider repeat low dose CT lung cancer screen (they should be calling you about that in one month.  Increase fluid and fiber intake.

## 2018-01-29 ENCOUNTER — Telehealth: Payer: Self-pay | Admitting: Acute Care

## 2018-01-29 NOTE — Telephone Encounter (Signed)
Called and spoke with [name], Patient's Daughter, [phone].  She was returning a call about scheduling a CT for the lung cancer screening.   Explained that I would send a message to follow up to [facility]. [name] stated understanding.  Will route to Denise,RN to follow up

## 2018-02-01 ENCOUNTER — Other Ambulatory Visit: Payer: Self-pay | Admitting: Acute Care

## 2018-02-01 DIAGNOSIS — Z122 Encounter for screening for malignant neoplasm of respiratory organs: Secondary | ICD-10-CM

## 2018-02-01 DIAGNOSIS — Z87891 Personal history of nicotine dependence: Secondary | ICD-10-CM

## 2018-02-01 NOTE — Telephone Encounter (Signed)
I have this [facility] CT scheduled for 03/07/2018 @ 8:30am per daughters request

## 2018-02-01 NOTE — Telephone Encounter (Signed)
Martin Navarro, can you call pt to schedule this Ct?  It looks like you had tried to call them recently.

## 2018-02-04 ENCOUNTER — Other Ambulatory Visit: Payer: Self-pay | Admitting: Family Medicine

## 2018-03-07 ENCOUNTER — Ambulatory Visit (INDEPENDENT_AMBULATORY_CARE_PROVIDER_SITE_OTHER)
Admission: RE | Admit: 2018-03-07 | Discharge: 2018-03-07 | Disposition: A | Discharge status: Home / Self Care | Payer: Medicare Other | Source: Ambulatory Visit | Attending: Acute Care | Admitting: Acute Care

## 2018-03-07 DIAGNOSIS — Z87891 Personal history of nicotine dependence: Secondary | ICD-10-CM

## 2018-03-07 DIAGNOSIS — Z122 Encounter for screening for malignant neoplasm of respiratory organs: Secondary | ICD-10-CM

## 2018-03-11 ENCOUNTER — Other Ambulatory Visit: Payer: Self-pay | Admitting: Acute Care

## 2018-03-11 DIAGNOSIS — Z87891 Personal history of nicotine dependence: Secondary | ICD-10-CM

## 2018-03-11 DIAGNOSIS — Z122 Encounter for screening for malignant neoplasm of respiratory organs: Secondary | ICD-10-CM

## 2018-03-25 ENCOUNTER — Encounter: Payer: Self-pay | Admitting: Family Medicine

## 2018-03-25 ENCOUNTER — Ambulatory Visit (INDEPENDENT_AMBULATORY_CARE_PROVIDER_SITE_OTHER): Payer: Medicare Other | Admitting: Family Medicine

## 2018-03-25 VITALS — BP 128/88 | HR 44 | Temp 98.2°F | Wt 177.1 lb

## 2018-03-25 DIAGNOSIS — H60501 Unspecified acute noninfective otitis externa, right ear: Secondary | ICD-10-CM | POA: Diagnosis not present

## 2018-03-25 MED ORDER — DIAZEPAM 2 MG PO TABS: 2.0000 mg | ORAL_TABLET | Freq: Four times a day (QID) | ORAL | 0 refills | Status: DC | PRN

## 2018-03-25 MED ORDER — CIPROFLOXACIN-DEXAMETHASONE 0.3-0.1 % OT SUSP: 4.0000 [drp] | Freq: Two times a day (BID) | OTIC | 0 refills | Status: DC

## 2018-03-25 NOTE — Progress Notes (Signed)
   Subjective:    Patient ID: Martin Navarro, male    DOB: [birth year], 67 y.o.   MRN: [id number]  HPI Here for 3 days of pain in the right ear. No fever or sinus congestion.    Review of Systems  Constitutional: Negative.   HENT: Positive for ear pain. Negative for congestion, ear discharge, postnasal drip, sinus pressure, sinus pain and sore throat.   Eyes: Negative.   Respiratory: Negative.        Objective:   Physical Exam Constitutional:      Appearance: Normal appearance.  HENT:     Head:     Comments: Right ear canal is red and swollen     Right Ear: Tympanic membrane normal.     Left Ear: Tympanic membrane and ear canal normal.     Nose: Nose normal.     Mouth/Throat:     Pharynx: Oropharynx is clear.  Eyes:     Conjunctiva/sclera: Conjunctivae normal.  Pulmonary:     Effort: Pulmonary effort is normal.     Breath sounds: Normal breath sounds.  Lymphadenopathy:     Cervical: No cervical adenopathy.  Neurological:     Mental Status: He is alert.           Assessment & Plan:  Otitis externa, treat with Ciprofex drops. I advised him to never use QTips in the ear canals.  Alysia Penna, MD

## 2018-03-29 ENCOUNTER — Ambulatory Visit: Payer: Self-pay | Admitting: Family Medicine

## 2018-03-29 ENCOUNTER — Encounter: Payer: Self-pay | Admitting: Family Medicine

## 2018-03-29 DIAGNOSIS — L570 Actinic keratosis: Secondary | ICD-10-CM | POA: Diagnosis not present

## 2018-03-29 DIAGNOSIS — R238 Other skin changes: Secondary | ICD-10-CM | POA: Diagnosis not present

## 2018-03-29 DIAGNOSIS — L814 Other melanin hyperpigmentation: Secondary | ICD-10-CM | POA: Diagnosis not present

## 2018-03-29 DIAGNOSIS — D225 Melanocytic nevi of trunk: Secondary | ICD-10-CM | POA: Diagnosis not present

## 2018-03-29 DIAGNOSIS — D1801 Hemangioma of skin and subcutaneous tissue: Secondary | ICD-10-CM | POA: Diagnosis not present

## 2018-03-29 DIAGNOSIS — D485 Neoplasm of uncertain behavior of skin: Secondary | ICD-10-CM | POA: Diagnosis not present

## 2018-03-29 DIAGNOSIS — L821 Other seborrheic keratosis: Secondary | ICD-10-CM | POA: Diagnosis not present

## 2018-03-29 NOTE — Telephone Encounter (Signed)
Called patient and LMOVM to return call  Simpson for [facility] to Discuss results / PCP / recommendations / Schedule patient  Per Dr. Elease Hashimoto: Oral antibiotics generally not effective for otitis externa.   Really feel he needs assessment first.   CRM Created in case [name] (patients daughter) Calls back

## 2018-03-29 NOTE — Telephone Encounter (Signed)
Pt's daughter called back and wants him to have oral antibiotic. Spoke with [facility] and advised to make an appointment. No openings on Monday with PCP. Appt made with Dr Martinique for 2:30 pm 04/01/17.

## 2018-03-29 NOTE — Telephone Encounter (Signed)
Left message on machine for patient. CRM 

## 2018-03-29 NOTE — Telephone Encounter (Signed)
Patient saw Dr. Sarajane Jews on 03/25/18 and was prescribed some ear drops.  Per daughter, after visit her dad was having trouble with his hearing aid.  She took patient to see Dr. Anders Simmonds manages his hearing aid)and it was  suggested patient be on a antibiotic for his ear problem.  I explained to daughter [name] that the medication prescribed on 03-25-18 was in fact an antibiotic ear drop.  Per [name] it is not working because her dad can not hear with the hearing aid in his right ear.  Daughter wants  Dr. Elease Hashimoto, patients PCP to know and see  if antibiotic can be changed or if she needs to bring her dad back in.  No additional or worsening symptoms. Reason for Disposition . Caller has medication question only, adult not sick, and triager answers question  Answer Assessment - Initial Assessment Questions 1. SYMPTOMS: "Do you have any symptoms?"    no 2. SEVERITY: If symptoms are present, ask "Are they mild, moderate or severe?" Pain has improved  Protocols used: MEDICATION QUESTION CALL-A-AH

## 2018-03-29 NOTE — Telephone Encounter (Signed)
He received a good topical antibiotic ear drop.  If not better, recommend we reassess.

## 2018-03-29 NOTE — Telephone Encounter (Signed)
Please see message. Please advise if oral antibiotic is OK??

## 2018-03-29 NOTE — Telephone Encounter (Signed)
Oral antibiotics generally not effective for otitis externa.   Really feel he needs assessment first.

## 2018-04-01 ENCOUNTER — Ambulatory Visit (INDEPENDENT_AMBULATORY_CARE_PROVIDER_SITE_OTHER): Payer: Medicare Other | Admitting: Family Medicine

## 2018-04-01 ENCOUNTER — Encounter: Payer: Self-pay | Admitting: Family Medicine

## 2018-04-01 VITALS — BP 122/76 | HR 73 | Temp 98.3°F | Resp 12 | Ht 64.0 in | Wt 182.4 lb

## 2018-04-01 DIAGNOSIS — H6591 Unspecified nonsuppurative otitis media, right ear: Secondary | ICD-10-CM | POA: Diagnosis not present

## 2018-04-01 DIAGNOSIS — H9193 Unspecified hearing loss, bilateral: Secondary | ICD-10-CM

## 2018-04-01 MED ORDER — PREDNISONE 20 MG PO TABS: ORAL_TABLET | ORAL | 0 refills | Status: AC

## 2018-04-01 MED ORDER — AMOXICILLIN-POT CLAVULANATE 875-125 MG PO TABS
1.0000 | ORAL_TABLET | Freq: Two times a day (BID) | ORAL | 0 refills | Status: AC
Start: 2018-04-01 — End: 2018-04-11

## 2018-04-01 NOTE — Patient Instructions (Signed)
  [name] I have seen you today for an acute visit.  A few things to remember from today's visit:   Bilateral hearing loss, unspecified hearing loss type - Plan: predniSONE (DELTASONE) 20 MG tablet  Right serous otitis media, unspecified chronicity - Plan: amoxicillin-clavulanate (AUGMENTIN) 875-125 MG tablet   If medications prescribed today, they will not be refill upon request, a follow up appointment with PCP will be necessary to discuss continuation of of treatment if appropriate.     In general please monitor for signs of worsening symptoms and seek immediate medical attention if any concerning.    I hope you get better soon!

## 2018-04-01 NOTE — Progress Notes (Signed)
ACUTE VISIT  HPI:  Chief Complaint  Patient presents with  . Otitis Externa    right ear, still unable to hear, pain and swelling    Martin Navarro is a 68 y.o.male here today complaining of right hearing loss.  Evaluated for right earache on 03/25/2018, he was treated with Ciprodex eardrops. Pain has resolved but he developed sudden worsening hearing loss.  He has he denies, he was recently evaluated by his usual audiologist.  He was told that he needed to be treated for ear infection. He has mild nasal congestion or rhinorrhea. He has not noted ear drainage. Negative for recent, exposure to loud noise, or Hx of trauma.   Otalgia   There is pain in the right ear. This is a new problem. The current episode started 1 to 4 weeks ago. The problem has been resolved. There has been no fever. The patient is experiencing no pain. Associated symptoms include coughing, hearing loss and rhinorrhea. Pertinent negatives include no abdominal pain, diarrhea, ear discharge, headaches, neck pain, rash, sore throat or vomiting. He has tried ear drops and antibiotics for the symptoms. His past medical history is significant for hearing loss.   Negative for associated dizziness, headache, focal deficit. He has not noted a skin rash.  No sick contact. No known insect bite.   Because of hearing loss, her daughter was on speaker to complement history.  Review of Systems  Constitutional: Negative for activity change, appetite change, chills, fatigue and fever.  HENT: Positive for congestion, ear pain, hearing loss, postnasal drip and rhinorrhea. Negative for ear discharge, facial swelling, mouth sores, sinus pressure, sore throat, trouble swallowing and voice change.   Eyes: Negative for discharge and redness.  Respiratory: Positive for cough. Negative for shortness of breath and wheezing.   Gastrointestinal: Negative for abdominal pain, diarrhea and vomiting.  Musculoskeletal:  Negative for myalgias and neck pain.  Skin: Negative for rash.  Allergic/Immunologic: Positive for environmental allergies.  Neurological: Negative for syncope, weakness and headaches.  Psychiatric/Behavioral: Negative for confusion. The patient is nervous/anxious.       Current Outpatient Medications on File Prior to Visit  Medication Sig Dispense Refill  . aspirin 81 MG tablet Take 81 mg by mouth daily.      . ciprofloxacin-dexamethasone (CIPRODEX) OTIC suspension Place 4 drops into the right ear 2 (two) times daily. 7.5 mL 0  . diazepam (VALIUM) 2 MG tablet Take 1 tablet (2 mg total) by mouth every 6 (six) hours as needed for anxiety. 30 tablet 0  . Omega-3 Fatty Acids (FISH OIL) 1200 MG CAPS Take 1,200 mg by mouth daily.    Marland Kitchen omeprazole (PRILOSEC) 40 MG capsule TAKE 1 CAPSULE BY MOUTH EVERY DAY 90 capsule 0  . oxymetazoline (AFRIN) 0.05 % nasal spray Place 2 sprays 2 (two) times daily as needed into both nostrils for congestion.    . rosuvastatin (CRESTOR) 10 MG tablet TAKE 1 TABLET(10 MG) BY MOUTH DAILY 90 tablet 0  . vitamin B-12 (CYANOCOBALAMIN) 1000 MCG tablet Take 1,000-5,000 mcg by mouth daily.     . vitamin E 400 UNIT capsule Take 1,200 Units by mouth daily.     No current facility-administered medications on file prior to visit.      Past Medical History:  Diagnosis Date  . Asthma   . Emphysema of lung ([place])   . Erectile dysfunction   . Hyperlipidemia   . Prostatitis    No Known Allergies  Social History   Socioeconomic History  . Marital status: Divorced    Spouse name: Not on file  . Number of children: Not on file  . Years of education: Not on file  . Highest education level: Not on file  Occupational History  . Not on file  Social Needs  . Financial resource strain: Not on file  . Food insecurity:    Worry: Not on file    Inability: Not on file  . Transportation needs:    Medical: Not on file    Non-medical: Not on file  Tobacco Use  . Smoking  status: Former Smoker    Packs/day: 2.00    Years: 53.00    Pack years: 106.00    Types: E-cigarettes    Last attempt to quit: 02/07/2015    Years since quitting: 3.1  . Smokeless tobacco: Current User  . Tobacco comment: Pt now vapes, quit tobacco 2016  Substance and Sexual Activity  . Alcohol use: No  . Drug use: Not on file  . Sexual activity: Yes  Lifestyle  . Physical activity:    Days per week: Not on file    Minutes per session: Not on file  . Stress: Not on file  Relationships  . Social connections:    Talks on phone: Not on file    Gets together: Not on file    Attends religious service: Not on file    Active member of club or organization: Not on file    Attends meetings of clubs or organizations: Not on file    Relationship status: Not on file  Other Topics Concern  . Not on file  Social History Narrative  . Not on file    Vitals:   04/01/18 1502  BP: 122/76  Pulse: 73  Resp: 12  Temp: 98.3 F (36.8 C)  SpO2: 95%   Body mass index is 31.3 kg/m.   Physical Exam  Nursing note and vitals reviewed. Constitutional: He is oriented to person, place, and time. He appears well-developed. He does not appear ill. No distress.  HENT:  Head: Atraumatic.  Right Ear: External ear and ear canal normal. Tympanic membrane is not erythematous. A middle ear effusion is present. Decreased hearing is noted.  Left Ear: Tympanic membrane, external ear and ear canal normal. Decreased hearing is noted.  Nose: Rhinorrhea present. Right sinus exhibits no maxillary sinus tenderness and no frontal sinus tenderness. Left sinus exhibits no maxillary sinus tenderness and no frontal sinus tenderness.  Mouth/Throat: Oropharynx is clear and moist and mucous membranes are normal.  Hearing aid, bilateral.  Eyes: Pupils are equal, round, and reactive to light. Conjunctivae are normal.  Cardiovascular: Normal rate and regular rhythm.  No murmur heard. Respiratory: Effort normal and  breath sounds normal. No stridor. No respiratory distress.  Lymphadenopathy:    He has no cervical adenopathy.  Neurological: He is alert and oriented to person, place, and time. He has normal strength.  Skin: Skin is warm. No rash noted. No erythema.  Psychiatric: He has a normal mood and affect.  Well groomed, good eye contact.    ASSESSMENT AND PLAN:  Mr. Shubham was seen today for otitis externa.  Diagnoses and all orders for this visit:  Bilateral hearing loss, unspecified hearing loss type We discussed possible etiologies. Because starting worsening hearing loss, I recommend prednisone taper.  He understands side effects, he has taken medication before and has been well-tolerated. Follow-up with PCP in 8 days, before if needed.  Hearing Screening   125Hz  250Hz  500Hz  1000Hz  2000Hz  3000Hz  4000Hz  6000Hz  8000Hz   Right ear:   Fail Fail Fail  Fail    Left ear:   Fail Fail Fail  Fail      -     predniSONE (DELTASONE) 20 MG tablet; 3 tabs for 5 days, 2 tabs for 2 days, 1 tabs for 2 days, and 1/2 tab for 2 days. Take tables together with breakfast.  Right serous otitis media, unspecified chronicity We discussed current recommendations in regard to treatment. He agrees with trying oral antibiotic, we discussed some side effects. Instructed about warning signs. He is going to need ENT evaluation if symptoms are not greatly improved within 5 to 7 days. Follow-up with PCP in 8 days, before if needed.   -     amoxicillin-clavulanate (AUGMENTIN) 875-125 MG tablet; Take 1 tablet by mouth 2 (two) times daily for 10 days.   Plan was discussed with his daughter, she voices understanding.     Valmore Arabie G. Martinique, MD  [facility] [place] office.

## 2018-04-02 NOTE — Telephone Encounter (Signed)
Patient came to see Dr. Martinique on 04/01/18 and has a follow up with Dr. Elease Hashimoto on 04/09/18.

## 2018-04-03 DIAGNOSIS — H1132 Conjunctival hemorrhage, left eye: Secondary | ICD-10-CM | POA: Diagnosis not present

## 2018-04-05 DIAGNOSIS — H10013 Acute follicular conjunctivitis, bilateral: Secondary | ICD-10-CM | POA: Diagnosis not present

## 2018-04-09 ENCOUNTER — Ambulatory Visit (INDEPENDENT_AMBULATORY_CARE_PROVIDER_SITE_OTHER): Payer: Medicare Other | Admitting: Family Medicine

## 2018-04-09 ENCOUNTER — Encounter: Payer: Self-pay | Admitting: Family Medicine

## 2018-04-09 ENCOUNTER — Other Ambulatory Visit: Payer: Self-pay

## 2018-04-09 VITALS — BP 140/82 | HR 68 | Temp 97.8°F | Ht 64.0 in | Wt 181.4 lb

## 2018-04-09 DIAGNOSIS — H1132 Conjunctival hemorrhage, left eye: Secondary | ICD-10-CM | POA: Diagnosis not present

## 2018-04-09 DIAGNOSIS — H9121 Sudden idiopathic hearing loss, right ear: Secondary | ICD-10-CM | POA: Diagnosis not present

## 2018-04-09 DIAGNOSIS — M25521 Pain in right elbow: Secondary | ICD-10-CM | POA: Diagnosis not present

## 2018-04-09 MED ORDER — HYDROCODONE-HOMATROPINE 5-1.5 MG/5ML PO SYRP: 5.0000 mL | ORAL_SOLUTION | Freq: Four times a day (QID) | ORAL | 0 refills | Status: AC | PRN

## 2018-04-09 NOTE — Patient Instructions (Signed)
Hearing Loss  Hearing loss is a partial or total loss of the ability to hear. This can be temporary or permanent, and it can happen in one or both ears. Hearing loss may be referred to as deafness. Medical care is necessary to treat hearing loss properly and to prevent the condition from getting worse. Your hearing may partially or completely come back, depending on what caused your hearing loss and how severe it is. In some cases, hearing loss is permanent. What are the causes? Common causes of hearing loss include:  Too much wax in the ear canal.  Infection of the ear canal or middle ear.  Fluid in the middle ear.  Injury to the ear or surrounding area.  An object stuck in the ear.  Prolonged exposure to loud sounds, such as music. Less common causes of hearing loss include:  Tumors in the ear.  Viral or bacterial infections, such as meningitis.  A hole in the eardrum (perforated eardrum).  Problems with the hearing nerve that sends signals between the brain and the ear.  Certain medicines. What are the signs or symptoms? Symptoms of this condition may include:  Difficulty telling the difference between sounds.  Difficulty following a conversation when there is background noise.  Lack of response to sounds in your environment. This may be most noticeable when you do not respond to startling sounds.  Needing to turn up the volume on the television, radio, etc.  Ringing in the ears.  Dizziness.  Pain in the ears. How is this diagnosed? This condition is diagnosed based on a physical exam and a hearing test (audiometry). The audiometry test will be performed by a hearing specialist (audiologist). You may also be referred to an ear, nose, and throat (ENT) specialist (otolaryngologist). How is this treated? Treatment for recent onset of hearing loss may include:  Ear wax removal.  Being prescribed medicines to prevent infection (antibiotics).  Being prescribed  medicines to reduce inflammation (corticosteroids). Follow these instructions at home:  If you were prescribed an antibiotic medicine, take it as told by your health care provider. Do not stop taking the antibiotic even if you start to feel better.  Take over-the-counter and prescription medicines only as told by your health care provider.  Avoid loud noises.  Return to your normal activities as told by your health care provider. Ask your health care provider what activities are safe for you.  Keep all follow-up visits as told by your health care provider. This is important. Contact a health care provider if:  You feel dizzy.  You develop new symptoms.  You vomit or feel nauseous.  You have a fever. Get help right away if:  You develop sudden changes in your vision.  You have severe ear pain.  You have new or increased weakness.  You have a severe headache. This information is not intended to replace advice given to you by your health care provider. Make sure you discuss any questions you have with your health care provider. Document Released: 03/13/2005 Document Revised: 08/19/2015 Document Reviewed: 07/29/2014 Elsevier Interactive Patient Education  2019 [employer].  We are setting up ENT referral.

## 2018-04-09 NOTE — Progress Notes (Signed)
Subjective:     Patient ID: Martin Navarro, male   DOB: 1951/02/09, 68 y.o.   MRN: [id number]  HPI Patient seen with decreased hearing right ear.  Recent history is that he was here around end of December with right otitis externa.  He was treated with antibiotic drop.  He continued to have ear symptoms and was seen in follow-up a week later and at that point had noticed some decreased hearing of the right ear and was started on prednisone along with Augmentin and he is still taking both of those.  His hearing is not changed.  He has no ear pain at this time and no ear drainage.  Denies any vertigo.  He has bilateral hearing aids but notices an acute difference between the right and left ear  Other issue is onset recently of subconjunctival hemorrhage of the left eye.  He went to urgent care and then subsequently seen the ophthalmologist and prescribed antibiotic drop.  No blurred vision.  Right elbow pain.  Right-hand-dominant.  No injury.  He has some pain and stiffness but no warmth.  No rash.  Past Medical History:  Diagnosis Date  . Asthma   . Emphysema of lung ([place])   . Erectile dysfunction   . Hyperlipidemia   . Prostatitis    History reviewed. No pertinent surgical history.  reports that he quit smoking about 3 years ago. His smoking use included e-cigarettes. He has a 106.00 pack-year smoking history. He uses smokeless tobacco. He reports that he does not drink alcohol. No history on file for drug. family history includes Heart disease (age of onset: 43) in his father; Hypertension in his father. No Known Allergies   Review of Systems  Constitutional: Negative for chills and fever.  HENT: Positive for hearing loss. Negative for congestion, ear discharge and ear pain.   Respiratory: Negative for shortness of breath.   Cardiovascular: Negative for chest pain.       Objective:   Physical Exam HENT:     Right Ear: Ear canal normal.     Left Ear: Ear canal normal.     Ears:      Comments: External canal appears normal bilaterally.  He has no erythema of either eardrum.  No visible effusion.  No cerumen Eyes:     Comments: Left subconjunctival hemorrhage  Neck:     Musculoskeletal: Neck supple.  Cardiovascular:     Rate and Rhythm: Normal rate and regular rhythm.  Pulmonary:     Effort: Pulmonary effort is normal.     Breath sounds: Normal breath sounds.  Musculoskeletal:     Comments: Right elbow full range of motion.  He has some mild right medial epicondylar tenderness.  No pain with gripping.  No warmth.  No erythema.  No ecchymosis.  No localized bony tenderness.  Lymphadenopathy:     Cervical: No cervical adenopathy.        Assessment:     #1 acute hearing loss right ear.  He did have recent otitis externa and was started promptly a week ago on prednisone with onset of hearing loss about 2 weeks ago. ?sudden right sensorineural hearing loss  #2 benign-appearing left subconjunctival hemorrhage  #3 right elbow pain.  Etiology unclear.  Does have some mild medial epicondylar tenderness but no pain with gripping.    Plan:     -Recommend he finish out the Augmentin and prednisone -Set up ENT referral regarding acute right hearing loss -We recommend observation regarding right  elbow for now.  If not improving of the next couple weeks consider x-rays  [name] MD [facility] at [facility]

## 2018-04-19 DIAGNOSIS — H6523 Chronic serous otitis media, bilateral: Secondary | ICD-10-CM | POA: Diagnosis not present

## 2018-04-19 DIAGNOSIS — H903 Sensorineural hearing loss, bilateral: Secondary | ICD-10-CM | POA: Diagnosis not present

## 2018-04-24 DIAGNOSIS — D485 Neoplasm of uncertain behavior of skin: Secondary | ICD-10-CM | POA: Diagnosis not present

## 2018-04-24 DIAGNOSIS — L905 Scar conditions and fibrosis of skin: Secondary | ICD-10-CM | POA: Diagnosis not present

## 2018-05-13 ENCOUNTER — Other Ambulatory Visit: Payer: Self-pay | Admitting: Family Medicine

## 2018-11-03 IMAGING — US US SCROTUM
1 series · 13 of 25 positions shown · non-contrast
Comparison: None.

CLINICAL DATA: Since right testicle pain for 1 day.  Fever.

EXAM:
SCROTAL ULTRASOUND
DOPPLER ULTRASOUND OF THE TESTICLES
TECHNIQUE: Complete ultrasound examination of the testicles, epididymis, and
other scrotal structures was performed. Color and spectral Doppler
ultrasound were also utilized to evaluate blood flow to the
testicles.

[Series 1: us scrotum · 0.06mm/px · 13 of 72 slices shown]
[im 1/72]
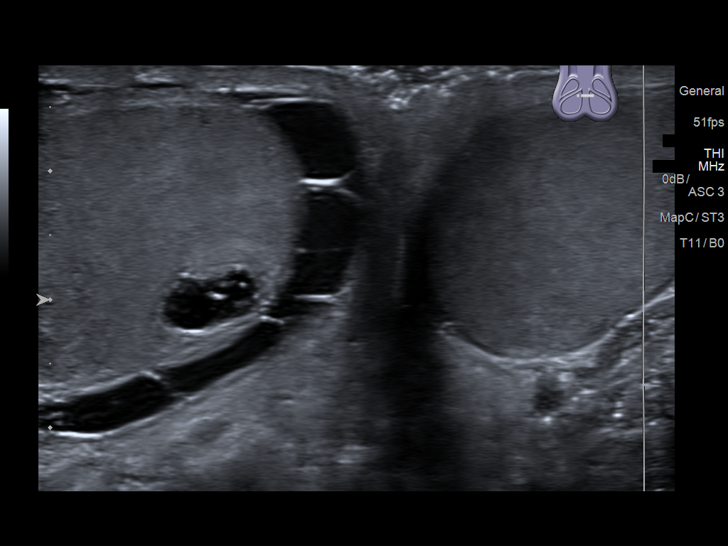
[im 6/72]
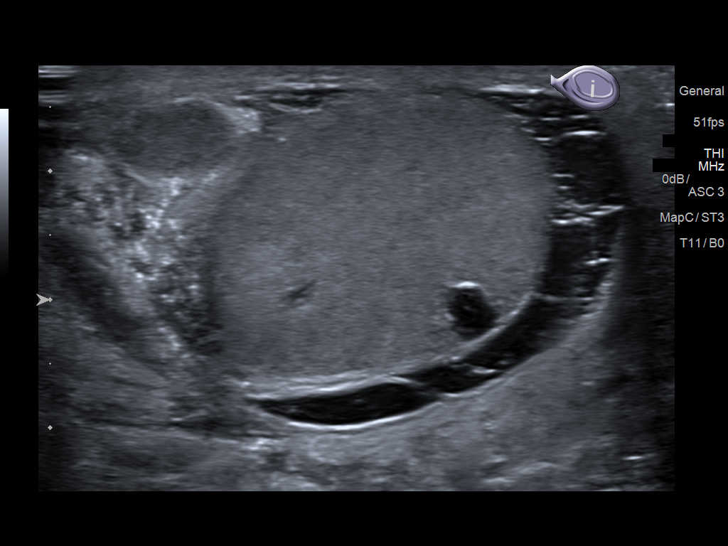
[im 12/72]
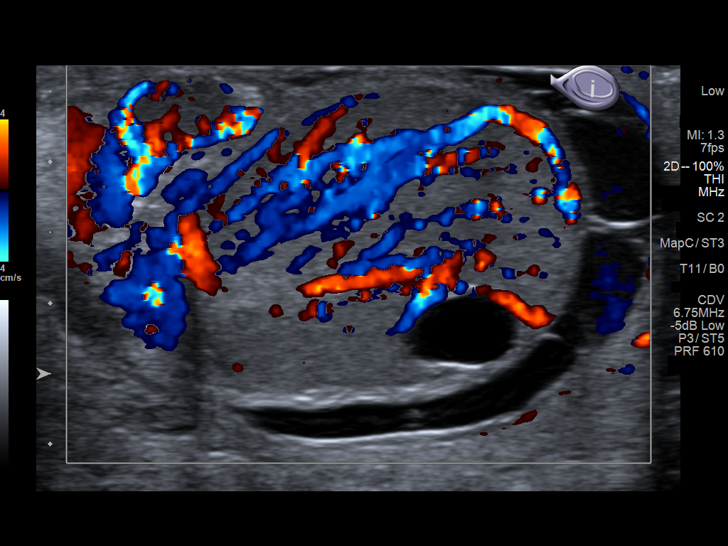
[im 18/72]
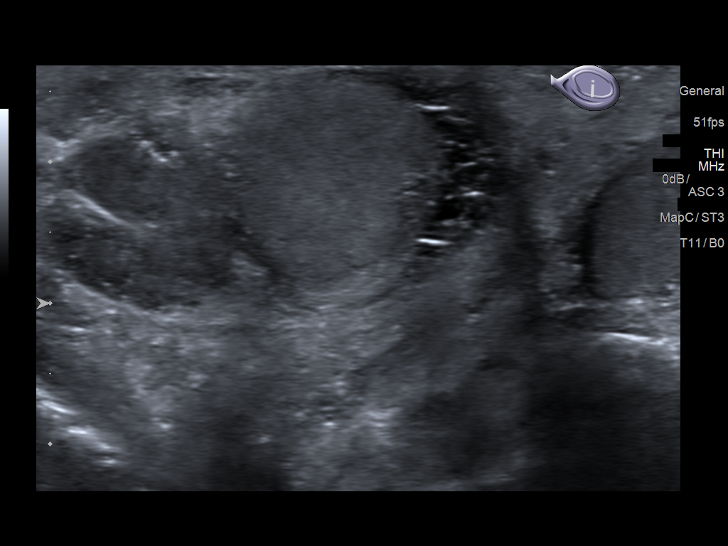
[im 24/72]
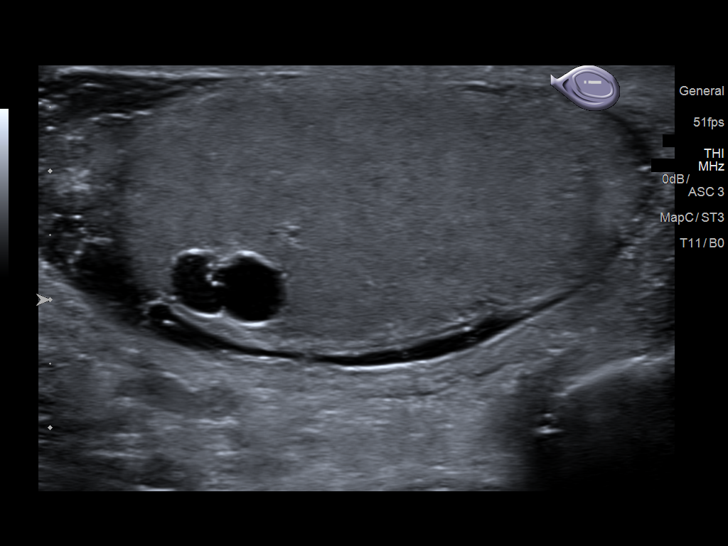
[im 30/72]
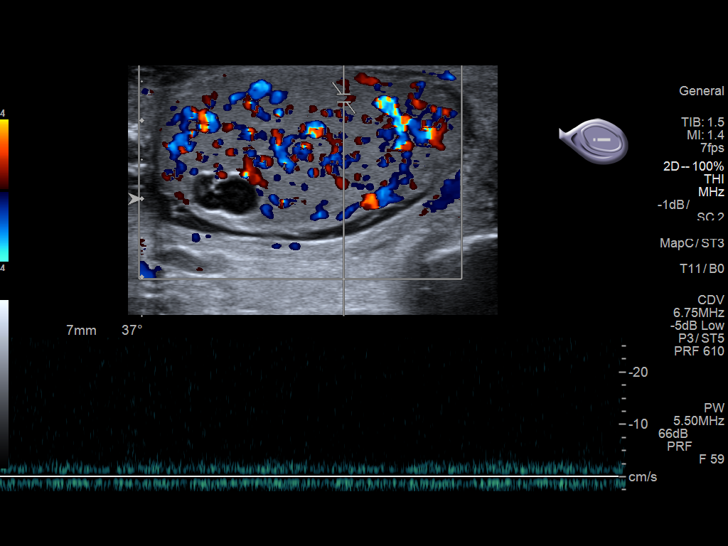
[im 36/72]
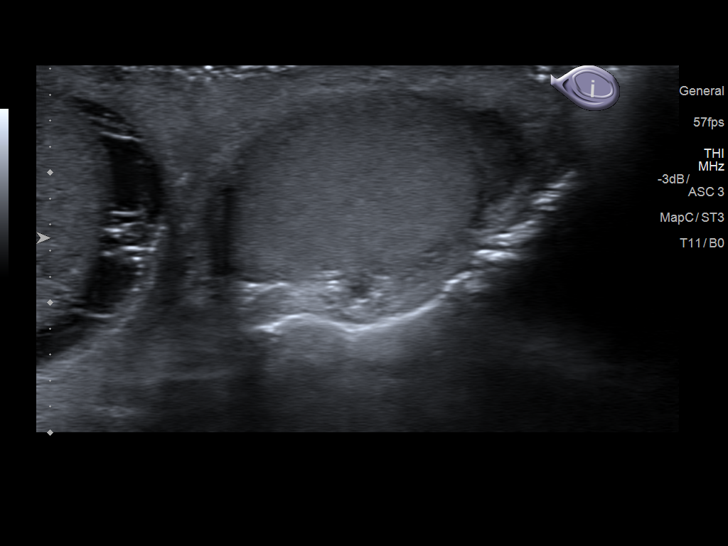
[im 42/72]
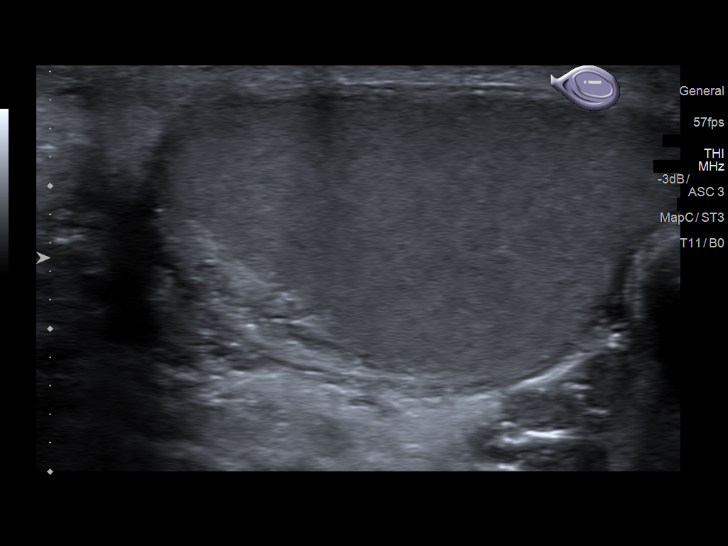
[im 48/72]
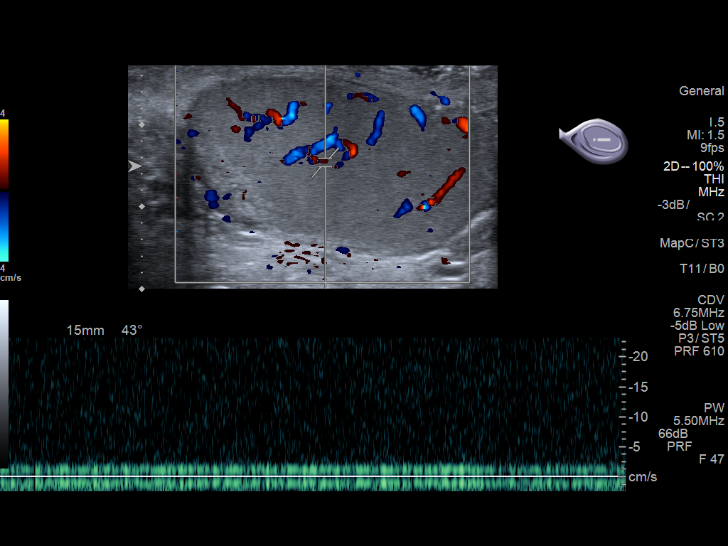
[im 54/72]
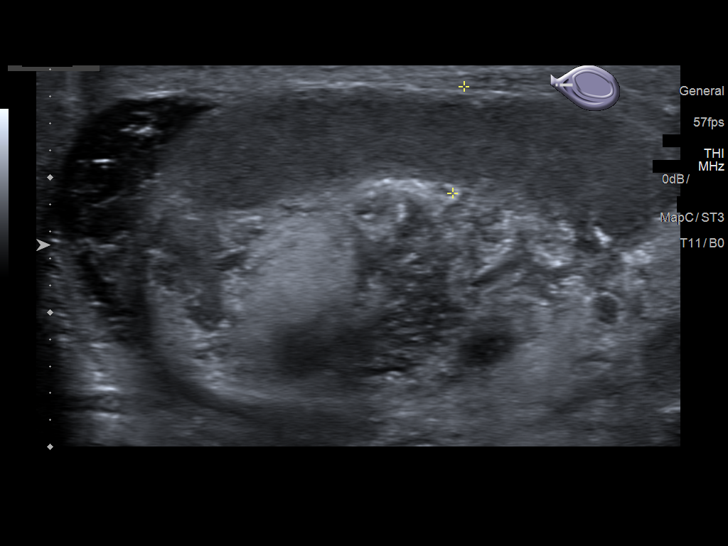
[im 60/72]
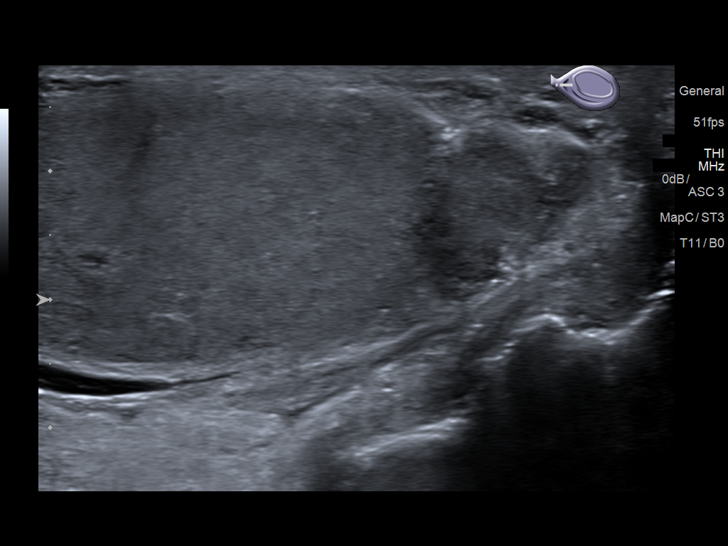
[im 66/72]
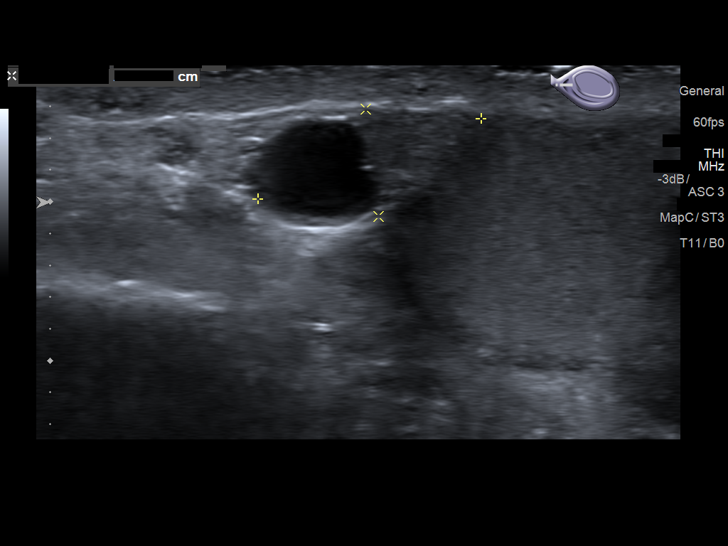
[im 72/72]
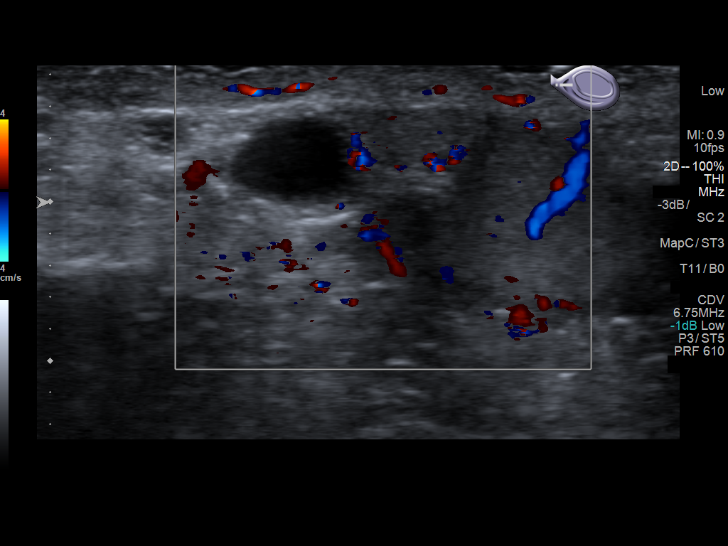

[13 of 25 positions shown; findings below may reference images not displayed]

FINDINGS: Right testicle

Measurements: 3.8 x 2.3 x 2.8 cm. A complex cystic lesion is present
within the parenchyma of the right testicle measuring 0.9 x 0.5 x
0.8 cm. The right testicle is diffusely increased in vascularity.

Left testicle

Measurements: 3.9 x 2.3 x 2.5 cm. No mass or microlithiasis
visualized.

Right epididymis: There is increased vascularity throughout the
right epididymis.

Left epididymis: Normal vascularity. There is an epididymal cyst
measuring 0.9 x 0.7 x 0.6 cm.

Hydrocele:  A right hydrocele is present with septations.

Varicocele: Mild left varicocele is present. Veins measure up to 2
mm.

Pulsed Doppler interrogation of both testes demonstrates normal low
resistance arterial and venous waveforms bilaterally.
IMPRESSION: No evidence of testicular torsion.

There is increased vascularity within the right testicle and
epididymis consistent with epididymo-orchitis.

There is a complex cystic lesion in the right testicle measuring up
0.9 cm. Malignancy is not excluded. Urology consultation is
recommended.

Benign left epididymal cyst.

Loculated right hydrocele worrisome foreign inflammatory process.

Mild left varicocele.

## 2018-11-16 ENCOUNTER — Other Ambulatory Visit: Payer: Self-pay | Admitting: Family Medicine

## 2018-11-18 ENCOUNTER — Ambulatory Visit (INDEPENDENT_AMBULATORY_CARE_PROVIDER_SITE_OTHER): Payer: Medicare Other | Admitting: Family Medicine

## 2018-11-18 ENCOUNTER — Other Ambulatory Visit: Payer: Self-pay

## 2018-11-18 ENCOUNTER — Encounter: Payer: Self-pay | Admitting: Family Medicine

## 2018-11-18 VITALS — BP 148/86 | HR 73 | Temp 98.3°F | Ht 64.0 in | Wt 176.6 lb

## 2018-11-18 DIAGNOSIS — M7061 Trochanteric bursitis, right hip: Secondary | ICD-10-CM | POA: Diagnosis not present

## 2018-11-18 MED ORDER — METHYLPREDNISOLONE ACETATE 80 MG/ML IJ SUSP
80.0000 mg | Freq: Once | INTRAMUSCULAR | Status: AC
  Administered 2018-11-18: 14:00:00 80 mg via INTRA_ARTICULAR

## 2018-11-18 NOTE — Patient Instructions (Signed)
Hip Bursitis  Hip bursitis is inflammation of a fluid-filled sac (bursa) in the hip joint. The bursa prevents the bones in the hip joint from rubbing against each other. Hip bursitis can cause mild to moderate pain, and symptoms often come and go over time. What are the causes? This condition may be caused by:  Injury to the hip.  Overuse of the muscles that surround the hip joint.  Previous injury or surgery of the hip.  Arthritis or gout.  Diabetes.  Thyroid disease.  Infection. In some cases, the cause may not be known. What are the signs or symptoms? Symptoms of this condition include:  Mild or moderate pain in the hip area. Pain may get worse with movement.  Tenderness and swelling of the hip, especially on the outer side of the hip.  In rare cases, the bursa may become infected. This may cause a fever, as well as warmth and redness in the area. Symptoms may come and go. How is this diagnosed? This condition may be diagnosed based on:  A physical exam.  Your medical history.  X-rays.  Removal of fluid from your inflamed bursa for testing (biopsy). You may be sent to a health care provider who specializes in bone diseases (orthopedist) or a provider who specializes in joint inflammation (rheumatologist). How is this treated? This condition is treated by resting, icing, applying pressure (compression), and raising (elevating) the injured area. This is called RICE treatment. In some cases, this may be enough to make your symptoms go away. Treatment may also include:  Using crutches.  Draining fluid out of the bursa to help relieve swelling.  Injecting medicine that helps to reduce inflammation (cortisone).  Additional medicines if the bursa is infected. Follow these instructions at home: Managing pain, stiffness, and swelling   If directed, put ice on the painful area. ? Put ice in a plastic bag. ? Place a towel between your skin and the bag. ? Leave the ice  on for 20 minutes, 2-3 times a day. ? Raise (elevate) your hip above the level of your heart as much as you can without pain. To do this, try putting a pillow under your hips while you lie down. Activity  Return to your normal activities as told by your health care provider. Ask your health care provider what activities are safe for you.  Rest and protect your hip as much as possible until your pain and swelling get better. General instructions  Take over-the-counter and prescription medicines only as told by your health care provider.  Wear compression wraps only as told by your health care provider.  Do not use your hip to support your body weight until your health care provider says that you can. Use crutches as told by your health care provider.  Gently massage and stretch your injured area as often as is comfortable.  Keep all follow-up visits as told by your health care provider. This is important. How is this prevented?  Exercise regularly, as told by your health care provider.  Warm up and stretch before being active.  Cool down and stretch after being active.  If an activity irritates your hip or causes pain, avoid the activity as much as possible.  Avoid sitting down for long periods at a time. Contact a health care provider if you:  Have a fever.  Develop new symptoms.  Have difficulty walking or doing everyday activities.  Have pain that gets worse or does not get better with medicine.  Develop red skin or a feeling of warmth in your hip area. Get help right away if you:  Cannot move your hip.  Have severe pain. Summary  Hip bursitis is inflammation of a fluid-filled sac (bursa) in the hip joint.  Hip bursitis can cause mild to moderate pain, and symptoms often come and go over time.  This condition is treated with rest, ice, compression, elevation, and medicines. This information is not intended to replace advice given to you by your health care  provider. Make sure you discuss any questions you have with your health care provider. Document Released: 09/02/2001 Document Revised: 11/19/2017 Document Reviewed: 11/19/2017 Elsevier Patient Education  2020 [employer] Inc[employer]  

## 2018-11-18 NOTE — Progress Notes (Signed)
  Subjective:     Patient ID: Martin Navarro, male   DOB: [birth year], 68 y.o.   MRN: [id number]  HPI Patient seen right lateral hip pain.  Onset about 8 days ago.  No injury.  Describes achy pain especially at night.  Moderate to severe at times.  No medial pain.  No real pain with ambulation.  He tried Advil and Aleve and Tylenol without relief.  Also tried some topical BenGay without relief Not aware of any history of similar pain.  Denies any low back pain.  No recent skin rashes in this area.  Past Medical History:  Diagnosis Date  . Asthma   . Emphysema of lung ([place])   . Erectile dysfunction   . Hyperlipidemia   . Prostatitis    History reviewed. No pertinent surgical history.  reports that he quit smoking about 3 years ago. His smoking use included e-cigarettes. He has a 106.00 pack-year smoking history. He uses smokeless tobacco. He reports that he does not drink alcohol. No history on file for drug. family history includes Heart disease (age of onset: 20) in his father; Hypertension in his father. No Known Allergies   Review of Systems  Constitutional: Negative for chills and fever.       Objective:   Physical Exam Constitutional:      Appearance: Normal appearance.  Cardiovascular:     Rate and Rhythm: Normal rate and regular rhythm.  Musculoskeletal:     Comments: Excellent range of motion right hip.  He has tenderness localized over the right greater trochanteric bursa. No leg edema  Neurological:     Mental Status: He is alert.        Assessment:     Right lateral hip pain.  Not relieved with over-the-counter nonsteroidals    Plan:     Discussed risk and benefits of steroid injection right lateral hip including risk of bleeding, bruising, infection and patient consented.  Prepped skin with Betadine.  Sterile technique used.  Using 25-gauge 1 inch needle injected 80 mg Depo-Medrol and 2 cc of plain Xylocaine -Try some icing tonight and touch base in 2 to 3  weeks if not improving  [name] MD [facility]

## 2018-12-23 ENCOUNTER — Ambulatory Visit (INDEPENDENT_AMBULATORY_CARE_PROVIDER_SITE_OTHER): Payer: Medicare Other | Admitting: Family Medicine

## 2018-12-23 ENCOUNTER — Encounter: Payer: Self-pay | Admitting: Family Medicine

## 2018-12-23 ENCOUNTER — Other Ambulatory Visit: Payer: Self-pay

## 2018-12-23 VITALS — BP 130/70 | HR 62 | Temp 97.9°F | Wt 172.9 lb

## 2018-12-23 DIAGNOSIS — Z0000 Encounter for general adult medical examination without abnormal findings: Secondary | ICD-10-CM | POA: Diagnosis not present

## 2018-12-23 LAB — BASIC METABOLIC PANEL
BUN: 14 mg/dL (ref 6–23)
CO2: 30 mEq/L (ref 19–32)
Calcium: 9.2 mg/dL (ref 8.4–10.5)
Chloride: 103 mEq/L (ref 96–112)
Creatinine, Ser: 0.88 mg/dL (ref 0.40–1.50)
GFR: 85.73 mL/min (ref >60.00)
Glucose, Bld: 95 mg/dL (ref 70–99)
Potassium: 4.1 mEq/L (ref 3.5–5.1)
Sodium: 140 mEq/L (ref 135–145)

## 2018-12-23 LAB — CBC WITH DIFFERENTIAL/PLATELET
Basophils Absolute: 0.1 10*3/uL (ref 0.0–0.1)
Basophils Relative: 0.9 % (ref 0.0–3.0)
Eosinophils Absolute: 0.2 10*3/uL (ref 0.0–0.7)
Eosinophils Relative: 2.2 % (ref 0.0–5.0)
HCT: 42.6 % (ref 39.0–52.0)
Hemoglobin: 14.2 g/dL (ref 13.0–17.0)
Lymphocytes Relative: 27.8 % (ref 12.0–46.0)
Lymphs Abs: 2.2 10*3/uL (ref 0.7–4.0)
MCHC: 33.3 g/dL (ref 30.0–36.0)
MCV: 94.1 fl (ref 78.0–100.0)
Monocytes Absolute: 0.8 10*3/uL (ref 0.1–1.0)
Monocytes Relative: 10.3 % (ref 3.0–12.0)
Neutro Abs: 4.6 10*3/uL (ref 1.4–7.7)
Neutrophils Relative %: 58.8 % (ref 43.0–77.0)
Platelets: 230 10*3/uL (ref 150.0–400.0)
RBC: 4.53 Mil/uL (ref 4.22–5.81)
RDW: 14.3 % (ref 11.5–15.5)
WBC: 7.9 10*3/uL (ref 4.0–10.5)

## 2018-12-23 LAB — LIPID PANEL
Cholesterol: 148 mg/dL (ref 0–200)
HDL: 45 mg/dL (ref >39.00)
LDL Cholesterol: 80 mg/dL (ref 0–99)
NonHDL: 103.3
Total CHOL/HDL Ratio: 3
Triglycerides: 115 mg/dL (ref 0.0–149.0)
VLDL: 23 mg/dL (ref 0.0–40.0)

## 2018-12-23 LAB — TSH: TSH: 1.9 u[IU]/mL (ref 0.35–4.50)

## 2018-12-23 LAB — HEPATIC FUNCTION PANEL
ALT: 14 U/L (ref 0–53)
AST: 12 U/L (ref 0–37)
Albumin: 4.1 g/dL (ref 3.5–5.2)
Alkaline Phosphatase: 62 U/L (ref 39–117)
Bilirubin, Direct: 0.1 mg/dL (ref 0.0–0.3)
Total Bilirubin: 0.7 mg/dL (ref 0.2–1.2)
Total Protein: 6.8 g/dL (ref 6.0–8.3)

## 2018-12-23 LAB — PSA: PSA: 0.6 ng/mL (ref 0.10–4.00)

## 2018-12-23 NOTE — Progress Notes (Signed)
Subjective:     Patient ID: Martin Navarro, male   DOB: 02-27-1951, 68 y.o.   MRN: [id number]  HPI Patient seen for physical exam.  He has history of GERD, hyperlipidemia, ongoing nicotine use.  Stays very active.  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  He is quite busy between both businesses.  Generally feels well.  He takes Crestor for hyperlipidemia.  Is on omeprazole for GERD and that is well controlled.  Health maintenance reviewed:  He declines flu vaccines.  Pneumonia vaccines up-to-date.  Previous hepatitis C screen negative.  Colonoscopy up-to-date.  Will need tetanus booster in 2 years.  He had previous Zostavax 2012.  Declines Shingrix  He has had previous CT abdomen pelvis so Medicare abdominal aortic aneurysm screen not indicated  Past Medical History:  Diagnosis Date  . Asthma   . Emphysema of lung ([place])   . Erectile dysfunction   . Hyperlipidemia   . Prostatitis    No past surgical history on file.  reports that he quit smoking about 3 years ago. His smoking use included e-cigarettes. He has a 106.00 pack-year smoking history. He uses smokeless tobacco. He reports that he does not drink alcohol. No history on file for drug. family history includes Heart disease (age of onset: 86) in his father; Hypertension in his father. No Known Allergies   Review of Systems  Constitutional: Negative for activity change, appetite change, fatigue and fever.  HENT: Negative for congestion, ear pain and trouble swallowing.   Eyes: Negative for pain and visual disturbance.  Respiratory: Negative for cough, shortness of breath and wheezing.   Cardiovascular: Negative for chest pain and palpitations.  Gastrointestinal: Negative for abdominal distention, abdominal pain, blood in stool, constipation, diarrhea, nausea, rectal pain and vomiting.  Endocrine: Negative for polydipsia and polyuria.  Genitourinary: Negative for  dysuria, hematuria and testicular pain.  Musculoskeletal: Negative for joint swelling.  Skin: Negative for rash.  Neurological: Negative for dizziness, syncope and headaches.  Hematological: Negative for adenopathy.  Psychiatric/Behavioral: Negative for confusion and dysphoric mood.       Objective:   Physical Exam Constitutional:      General: He is not in acute distress.    Appearance: He is well-developed.  HENT:     Head: Normocephalic and atraumatic.     Right Ear: External ear normal.     Left Ear: External ear normal.  Eyes:     Conjunctiva/sclera: Conjunctivae normal.     Pupils: Pupils are equal, round, and reactive to light.  Neck:     Musculoskeletal: Normal range of motion and neck supple.     Thyroid: No thyromegaly.  Cardiovascular:     Rate and Rhythm: Normal rate and regular rhythm.     Heart sounds: Normal heart sounds. No murmur.  Pulmonary:     Effort: No respiratory distress.     Breath sounds: No wheezing or rales.  Abdominal:     General: Bowel sounds are normal. There is no distension.     Palpations: Abdomen is soft. There is no mass.     Tenderness: There is no abdominal tenderness. There is no guarding or rebound.  Lymphadenopathy:     Cervical: No cervical adenopathy.  Skin:    Findings: No rash.  Neurological:     Mental Status: He is alert and oriented to person, place, and time.     Cranial Nerves: No cranial nerve  deficit.     Deep Tendon Reflexes: Reflexes normal.        Assessment:     Physical exam.  Patient has history of ongoing nicotine use.  The following health maintenance issues were addressed    Plan:     -Obtain follow-up labs -The natural history of prostate cancer and ongoing controversy regarding screening and potential treatment outcomes of prostate cancer has been discussed with the patient. The meaning of a false positive PSA and a false negative PSA has been discussed. He indicates understanding of the limitations  of this screening test and wishes to proceed with screening PSA testing. -Recommended flu vaccine but he declines -He is already participating in CT lung cancer screening program -Strongly encouraged to stop smoking -We mention Shingrix vaccine but he is not interested at this time  [name] MD [facility]

## 2019-02-24 ENCOUNTER — Other Ambulatory Visit: Payer: Self-pay | Admitting: Family Medicine

## 2019-04-07 ENCOUNTER — Ambulatory Visit (INDEPENDENT_AMBULATORY_CARE_PROVIDER_SITE_OTHER)
Admission: RE | Admit: 2019-04-07 | Discharge: 2019-04-07 | Disposition: A | Discharge status: Home / Self Care | Payer: Medicare Other | Source: Ambulatory Visit | Attending: Acute Care | Admitting: Acute Care

## 2019-04-07 ENCOUNTER — Other Ambulatory Visit: Payer: Self-pay

## 2019-04-07 DIAGNOSIS — Z122 Encounter for screening for malignant neoplasm of respiratory organs: Secondary | ICD-10-CM

## 2019-04-07 DIAGNOSIS — Z87891 Personal history of nicotine dependence: Secondary | ICD-10-CM

## 2019-04-07 DIAGNOSIS — F17210 Nicotine dependence, cigarettes, uncomplicated: Secondary | ICD-10-CM | POA: Diagnosis not present

## 2019-04-17 ENCOUNTER — Other Ambulatory Visit: Payer: Self-pay | Admitting: *Deleted

## 2019-04-17 DIAGNOSIS — F17210 Nicotine dependence, cigarettes, uncomplicated: Secondary | ICD-10-CM

## 2019-04-17 DIAGNOSIS — Z87891 Personal history of nicotine dependence: Secondary | ICD-10-CM

## 2019-04-17 NOTE — Progress Notes (Signed)
Please call patient and let them  know their  low dose Ct was read as a Lung RADS 2: nodules that are benign in appearance and behavior with a very low likelihood of becoming a clinically active cancer due to size or lack of growth. Recommendation per radiology is for a repeat LDCT in 12 months. .Please let them  know we will order and schedule their  annual screening scan for 03/2020. Please let them  know there was notation of CAD on their  scan.  Please remind the patient  that this is a non-gated exam therefore degree or severity of disease  cannot be determined. Please have them  follow up with their PCP regarding potential risk factor modification, dietary therapy or pharmacologic therapy if clinically indicated. Pt.  is  currently on statin therapy. Please place order for annual  screening scan for  03/2020 and fax results to PCP. Thanks so much. 

## 2019-05-13 DIAGNOSIS — M25562 Pain in left knee: Secondary | ICD-10-CM | POA: Diagnosis not present

## 2019-05-13 DIAGNOSIS — S76312A Strain of muscle, fascia and tendon of the posterior muscle group at thigh level, left thigh, initial encounter: Secondary | ICD-10-CM | POA: Diagnosis not present

## 2019-05-13 DIAGNOSIS — M1712 Unilateral primary osteoarthritis, left knee: Secondary | ICD-10-CM | POA: Diagnosis not present

## 2019-05-13 DIAGNOSIS — M25552 Pain in left hip: Secondary | ICD-10-CM | POA: Diagnosis not present

## 2019-05-19 ENCOUNTER — Ambulatory Visit: Payer: Medicare Other | Admitting: Physical Therapy

## 2019-05-26 ENCOUNTER — Telehealth: Payer: Self-pay | Admitting: Interventional Cardiology

## 2019-05-26 NOTE — Telephone Encounter (Signed)
Note placed in chart, ok for daughter to accompany.

## 2019-05-26 NOTE — Telephone Encounter (Signed)
Patient's daughter, [name], is requesting to accompany the patient during his appointment scheduled for 06/20/19 at 9:40 with Dr. Tamala Julian due to a hearing impairment. Please advise.

## 2019-06-02 DIAGNOSIS — M1712 Unilateral primary osteoarthritis, left knee: Secondary | ICD-10-CM | POA: Diagnosis not present

## 2019-06-02 DIAGNOSIS — M25562 Pain in left knee: Secondary | ICD-10-CM | POA: Diagnosis not present

## 2019-06-19 NOTE — Progress Notes (Signed)
Cardiology Office Note:    Date:  06/20/2019   ID:  Boston Service, DOB 12-04-1950, MRN [id number]  PCP:  [name], MD  Cardiologist:  No primary care provider on file.   Referring MD: [name], MD   Chief Complaint  Patient presents with  . Coronary Artery Disease  . Congestive Heart Failure  . Advice Only    Obstructive sleep apnea    History of Present Illness:    Martin Navarro is a 69 y.o. male with a hx of chest pain, asthma, aortic atherosclerosis by CT and family h/o CAD. Here to establish cardiology care after retirement of Dr. Lisbeth Ply.   Accompanied by his daughter.  He has always had intermittent cardiovascular assessments because of his significant paternal family history of CAD involving his father and his grandmother.  Both had coronary bypass surgery.  The father also had a stroke.  He has seen Dr. Wynonia Lawman in the past.  Lipid therapy has been advised.  He has had at least 2 nuclear perfusion studies, most recently in 2018 that was unremarkable.  Several lung cancer CT screens have demonstrated coronary/aortic atherosclerosis.  Recently he has experienced a tingling feeling in the left pectoral area.  It occurs randomly.  It is not precipitated by physical activity.  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  He can do it quite as vigorously as he did 10 years ago but still gets all the work done.  He gets greater than 30 minutes of moderate activity per day.  The discomfort occurs randomly.  It may last 15 to 20 minutes before going away.  He is a smoker, most recently over the past 2 years converting to vaping.  He is prediabetic but did not consider himself to be.  Past Medical History:  Diagnosis Date  . Acute prostatitis 09/21/2016  . AKI (acute kidney injury) ([place])   . Asthma   . Chest pain   . COPD (chronic obstructive pulmonary disease) ([place])   . Dyspnea   . Emphysema of lung  ([place])   . Epididymoorchitis 01/28/2017  . Erectile dysfunction   . Family history of ischemic heart disease and other diseases of the circulatory system   . GERD (gastroesophageal reflux disease) 10/25/2010  . History of leukocytosis   . Hyperlipidemia   . 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 Hypokalemia   . Nausea   . Nicotine abuse 10/25/2010  . Prostatitis   . Sepsis ([place]) 09/21/2016    History reviewed. No pertinent surgical history.  Current Medications: Current Meds  Medication Sig  . aspirin 81 MG tablet Take 81 mg by mouth daily.    . meloxicam (MOBIC) 15 MG tablet Take 15 mg by mouth 3 (three) times daily.  . Omega-3 Fatty Acids (FISH OIL) 1200 MG CAPS Take 1,200 mg by mouth daily.  Marland Kitchen omeprazole (PRILOSEC) 40 MG capsule TAKE 1 CAPSULE BY MOUTH EVERY DAY  . oxymetazoline (AFRIN) 0.05 % nasal spray Place 2 sprays 2 (two) times daily as needed into both nostrils for congestion.  . rosuvastatin (CRESTOR) 10 MG tablet TAKE 1 TABLET(10 MG) BY MOUTH DAILY  . vitamin B-12 (CYANOCOBALAMIN) 1000 MCG tablet Take 1,000-5,000 mcg by mouth daily.   . vitamin E 400 UNIT capsule Take 1,200 Units by mouth daily.     Allergies:   Patient has no known allergies.   Social History   Socioeconomic History  .  Marital status: Divorced    Spouse name: Not on file  . Number of children: Not on file  . Years of education: Not on file  . Highest education level: Not on file  Occupational History  . Not on file  Tobacco Use  . Smoking status: Former Smoker    Packs/day: 2.00    Years: 53.00    Pack years: 106.00    Types: E-cigarettes    Quit date: 02/07/2015    Years since quitting: 4.3  . Smokeless tobacco: Current User  . Tobacco comment: Pt now vapes, quit tobacco 2016  Substance and Sexual Activity  . Alcohol use: No  . Drug use: Not on file  . Sexual activity: Yes  Other Topics Concern  . Not on file  Social History Narrative  . Not on file   Social Determinants of Health    Financial Resource Strain:   . Difficulty of Paying Living Expenses:   Food Insecurity:   . Worried About Charity fundraiser in the Last Year:   . Arboriculturist in the Last Year:   Transportation Needs:   . Film/video editor (Medical):   Marland Kitchen Lack of Transportation (Non-Medical):   Physical Activity:   . Days of Exercise per Week:   . Minutes of Exercise per Session:   Stress:   . Feeling of Stress :   Social Connections:   . Frequency of Communication with Friends and Family:   . Frequency of Social Gatherings with Friends and Family:   . Attends Religious Services:   . Active Member of Clubs or Organizations:   . Attends Archivist Meetings:   Marland Kitchen Marital Status:      Family History: The patient's family history includes Heart disease (age of onset: 96) in his father; Hypertension in his father.  ROS:   Please see the history of present illness.    Has some chronic back and knee discomfort.  Can crawl on the cars and do work as vigorously as before because of his knees.  All other systems reviewed and are negative.  EKGs/Labs/Other Studies Reviewed:    The following studies were reviewed today:  A CT chest/lung cancer screening performed 04/07/2019: IMPRESSION: 1. Lung-RADS 2S, benign appearance or behavior. Continue annual screening with low-dose chest CT without contrast in 12 months. 2. The "S" modifier above refers to potentially clinically significant non lung cancer related findings. Specifically, there is aortic atherosclerosis, in addition to left main and 2 vessel coronary artery disease. Please note that although the presence of coronary artery calcium documents the presence of coronary artery disease, the severity of this disease and any potential stenosis cannot be assessed on this non-gated CT examination. Assessment for potential risk factor modification, dietary therapy or pharmacologic therapy may be warranted, if clinically indicated. 3.  Mild diffuse bronchial wall thickening with mild to moderate centrilobular and paraseptal emphysema in saber sheath trachea; imaging findings suggestive of underlying COPD.  Aortic Atherosclerosis (ICD10-I70.0) and Emphysema (ICD10-J43.9).  EKG:  EKG performed today is normal with PR interval of 192 ms, normal sinus rhythm, and no evidence of LVH or acute ST-T wave change.  Recent Labs: 12/23/2018: ALT 14; BUN 14; Creatinine, Ser 0.88; Hemoglobin 14.2; Platelets 230.0; Potassium 4.1; Sodium 140; TSH 1.90  Recent Lipid Panel    Component Value Date/Time   CHOL 148 12/23/2018 1027   TRIG 115.0 12/23/2018 1027   HDL 45.00 12/23/2018 1027   CHOLHDL 3 12/23/2018 1027  VLDL 23.0 12/23/2018 1027   LDLCALC 80 12/23/2018 1027    Physical Exam:    VS:  BP 128/78   Pulse (!) 58   Ht 5\' 4"  (1.626 m)   Wt 181 lb 1.9 oz (82.2 kg)   SpO2 98%   BMI 31.09 kg/m     Wt Readings from Last 3 Encounters:  06/20/19 181 lb 1.9 oz (82.2 kg)  12/23/18 172 lb 14.4 oz (78.4 kg)  11/18/18 176 lb 9.6 oz (80.1 kg)     GEN: Healthy-appearing slightly overweight. No acute distress HEENT: Normal NECK: No JVD. LYMPHATICS: No lymphadenopathy CARDIAC:  RRR without murmur, gallop, or edema. VASCULAR:  Normal Pulses. No bruits. RESPIRATORY:  Clear to auscultation without rales, wheezing or rhonchi  ABDOMEN: Soft, non-tender, non-distended, No pulsatile mass, MUSCULOSKELETAL: No deformity  SKIN: Warm and dry NEUROLOGIC:  Alert and oriented x 3 PSYCHIATRIC:  Normal affect   ASSESSMENT:    1. Atypical chest pain   2. CAD in native artery   3. Hyperlipidemia, unspecified hyperlipidemia type   4. Educated about COVID-19 virus infection    PLAN:    In order of problems listed above:  1. Chest pain is very atypical for ischemia.  Given his multiple risk factors including smoking, prediabetes, and family history, we will perform a stress myocardial perfusion study. 2. Primary prevention is discussed.   Importance of regular physical activity, smoking cessation, blood pressure control, and lipid management were discussed in detail. 3. Continue Crestor 10 mg/day.  Target LDL is under 70.  Most recent was 81. 4. COVID-19 vaccine has been received.  Continue social distancing.  Overall education and awareness concerning primary/secondary risk prevention was discussed in detail: LDL less than 70, hemoglobin A1c less than 7, blood pressure target less than 130/80 mmHg, >150 minutes of moderate aerobic activity per week, avoidance of smoking, weight control (via diet and exercise), and continued surveillance/management of/for obstructive sleep apnea.    Medication Adjustments/Labs and Tests Ordered: Current medicines are reviewed at length with the patient today.  Concerns regarding medicines are outlined above.  Orders Placed This Encounter  Procedures  . MYOCARDIAL PERFUSION IMAGING  . EKG 12-Lead   No orders of the defined types were placed in this encounter.   Patient Instructions  Medication Instructions:  Your physician recommends that you continue on your current medications as directed. Please refer to the Current Medication list given to you today.  *If you need a refill on your cardiac medications before your next appointment, please call your pharmacy*   Lab Work: None  If you have labs (blood work) drawn today and your tests are completely normal, you will receive your results only by: Marland Kitchen MyChart Message (if you have MyChart) OR . A paper copy in the mail If you have any lab test that is abnormal or we need to change your treatment, we will call you to review the results.   Testing/Procedures: Your physician has requested that you have a lexiscan myoview. For further information please visit [email or web address]. Please follow instruction sheet, as given.    Follow-Up: At [facility], you and your health needs are our priority.  As part of our continuing mission to  provide you with exceptional heart care, we have created designated Provider Care Teams.  These Care Teams include your primary Cardiologist (physician) and Advanced Practice Providers (APPs -  Physician Assistants and Nurse Practitioners) who all work together to provide you with the care you need, when you need  it.  We recommend signing up for the patient portal called "MyChart".  Sign up information is provided on this After Visit Summary.  MyChart is used to connect with patients for Virtual Visits (Telemedicine).  Patients are able to view lab/test results, encounter notes, upcoming appointments, etc.  Non-urgent messages can be sent to your provider as well.   To learn more about what you can do with MyChart, go to [email or web address].    Your next appointment:   12 month(s)  The format for your next appointment:   In Person  Provider:   You may see Dr. Daneen Schick or one of the following Advanced Practice Providers on your designated Care Team:    Truitt Merle, NP  Cecilie Kicks, NP  Kathyrn Drown, NP    Other Instructions      Signed, Sinclair Grooms, MD  06/20/2019 1:52 PM    [place]

## 2019-06-20 ENCOUNTER — Other Ambulatory Visit: Payer: Self-pay

## 2019-06-20 ENCOUNTER — Encounter: Payer: Self-pay | Admitting: Interventional Cardiology

## 2019-06-20 ENCOUNTER — Encounter: Payer: Self-pay | Admitting: *Deleted

## 2019-06-20 ENCOUNTER — Ambulatory Visit: Payer: Medicare Other | Admitting: Interventional Cardiology

## 2019-06-20 VITALS — BP 128/78 | HR 58 | Ht 64.0 in | Wt 181.1 lb

## 2019-06-20 DIAGNOSIS — Z7189 Other specified counseling: Secondary | ICD-10-CM

## 2019-06-20 DIAGNOSIS — R0789 Other chest pain: Secondary | ICD-10-CM | POA: Diagnosis not present

## 2019-06-20 DIAGNOSIS — E785 Hyperlipidemia, unspecified: Secondary | ICD-10-CM | POA: Diagnosis not present

## 2019-06-20 DIAGNOSIS — I2510 Atherosclerotic heart disease of native coronary artery without angina pectoris: Secondary | ICD-10-CM

## 2019-06-20 NOTE — Patient Instructions (Signed)
Medication Instructions:  Your physician recommends that you continue on your current medications as directed. Please refer to the Current Medication list given to you today.  *If you need a refill on your cardiac medications before your next appointment, please call your pharmacy*   Lab Work: None  If you have labs (blood work) drawn today and your tests are completely normal, you will receive your results only by: Marland Kitchen MyChart Message (if you have MyChart) OR . A paper copy in the mail If you have any lab test that is abnormal or we need to change your treatment, we will call you to review the results.   Testing/Procedures: Your physician has requested that you have a lexiscan myoview. For further information please visit [email or web address]. Please follow instruction sheet, as given.    Follow-Up: At [facility], you and your health needs are our priority.  As part of our continuing mission to provide you with exceptional heart care, we have created designated Provider Care Teams.  These Care Teams include your primary Cardiologist (physician) and Advanced Practice Providers (APPs -  Physician Assistants and Nurse Practitioners) who all work together to provide you with the care you need, when you need it.  We recommend signing up for the patient portal called "MyChart".  Sign up information is provided on this After Visit Summary.  MyChart is used to connect with patients for Virtual Visits (Telemedicine).  Patients are able to view lab/test results, encounter notes, upcoming appointments, etc.  Non-urgent messages can be sent to your provider as well.   To learn more about what you can do with MyChart, go to [email or web address].    Your next appointment:   12 month(s)  The format for your next appointment:   In Person  Provider:   You may see Dr. Daneen Schick or one of the following Advanced Practice Providers on your designated Care Team:    Truitt Merle, NP  Cecilie Kicks, NP  Kathyrn Drown, NP    Other Instructions

## 2019-06-25 DIAGNOSIS — M25562 Pain in left knee: Secondary | ICD-10-CM | POA: Diagnosis not present

## 2019-06-26 ENCOUNTER — Telehealth (HOSPITAL_COMMUNITY): Payer: Self-pay

## 2019-06-26 NOTE — Telephone Encounter (Signed)
Spoke with the patient's daughter per DPR, detailed instructions given. She stated that shw understood and would bring him for his test. Asked to call back with any questions. S.Clarine Elrod EMTP

## 2019-07-01 ENCOUNTER — Other Ambulatory Visit: Payer: Self-pay

## 2019-07-01 ENCOUNTER — Ambulatory Visit (HOSPITAL_COMMUNITY): Payer: Medicare Other | Attending: Cardiovascular Disease

## 2019-07-01 DIAGNOSIS — I2510 Atherosclerotic heart disease of native coronary artery without angina pectoris: Secondary | ICD-10-CM | POA: Diagnosis not present

## 2019-07-01 LAB — MYOCARDIAL PERFUSION IMAGING
LV dias vol: 70 mL (ref 62–150)
LV sys vol: 18 mL
Peak HR: 85 {beats}/min
Rest HR: 53 {beats}/min
SDS: 1
SRS: 0
SSS: 1
TID: 1.04

## 2019-07-01 MED ORDER — TECHNETIUM TC 99M TETROFOSMIN IV KIT
32.5000 | PACK | Freq: Once | INTRAVENOUS | Status: AC | PRN
  Administered 2019-07-01: 32.5 via INTRAVENOUS
  Filled 2019-07-01: qty 33

## 2019-07-01 MED ORDER — REGADENOSON 0.4 MG/5ML IV SOLN
0.4000 mg | Freq: Once | INTRAVENOUS | Status: AC
  Administered 2019-07-01: 0.4 mg via INTRAVENOUS

## 2019-07-01 MED ORDER — TECHNETIUM TC 99M TETROFOSMIN IV KIT
10.1000 | PACK | Freq: Once | INTRAVENOUS | Status: AC | PRN
  Administered 2019-07-01: 10.1 via INTRAVENOUS
  Filled 2019-07-01: qty 11

## 2019-07-14 DIAGNOSIS — M25562 Pain in left knee: Secondary | ICD-10-CM | POA: Diagnosis not present

## 2019-07-14 DIAGNOSIS — M25561 Pain in right knee: Secondary | ICD-10-CM | POA: Diagnosis not present

## 2019-11-26 HISTORY — PX: SKIN TAG REMOVAL: SHX780

## 2019-11-30 DIAGNOSIS — Z20822 Contact with and (suspected) exposure to covid-19: Secondary | ICD-10-CM | POA: Diagnosis not present

## 2019-11-30 DIAGNOSIS — Z7189 Other specified counseling: Secondary | ICD-10-CM | POA: Diagnosis not present

## 2019-11-30 DIAGNOSIS — F17210 Nicotine dependence, cigarettes, uncomplicated: Secondary | ICD-10-CM | POA: Diagnosis not present

## 2019-12-22 DIAGNOSIS — L821 Other seborrheic keratosis: Secondary | ICD-10-CM | POA: Diagnosis not present

## 2019-12-22 DIAGNOSIS — Z1283 Encounter for screening for malignant neoplasm of skin: Secondary | ICD-10-CM | POA: Diagnosis not present

## 2019-12-22 DIAGNOSIS — D225 Melanocytic nevi of trunk: Secondary | ICD-10-CM | POA: Diagnosis not present

## 2019-12-22 DIAGNOSIS — X32XXXA Exposure to sunlight, initial encounter: Secondary | ICD-10-CM | POA: Diagnosis not present

## 2019-12-22 DIAGNOSIS — L820 Inflamed seborrheic keratosis: Secondary | ICD-10-CM | POA: Diagnosis not present

## 2019-12-22 DIAGNOSIS — L570 Actinic keratosis: Secondary | ICD-10-CM | POA: Diagnosis not present

## 2019-12-24 ENCOUNTER — Encounter: Payer: Self-pay | Admitting: Family Medicine

## 2019-12-24 ENCOUNTER — Ambulatory Visit (INDEPENDENT_AMBULATORY_CARE_PROVIDER_SITE_OTHER): Payer: Medicare Other | Admitting: Family Medicine

## 2019-12-24 ENCOUNTER — Other Ambulatory Visit: Payer: Self-pay

## 2019-12-24 VITALS — BP 156/80 | HR 64 | Temp 97.9°F | Ht 63.6 in | Wt 175.2 lb

## 2019-12-24 DIAGNOSIS — E785 Hyperlipidemia, unspecified: Secondary | ICD-10-CM | POA: Diagnosis not present

## 2019-12-24 DIAGNOSIS — Z0000 Encounter for general adult medical examination without abnormal findings: Secondary | ICD-10-CM

## 2019-12-24 NOTE — Patient Instructions (Signed)
Blood pressure is high today (156/80)  If possible, get home blood pressure cuff and monitor  Let's plan on 1 month follow up to reassess the BP

## 2019-12-24 NOTE — Progress Notes (Signed)
Established Patient Office Visit  Subjective:  Patient ID: Martin Navarro, male    DOB: 1950/05/21  Age: 69 y.o. MRN: [id number]  CC:  Chief Complaint  Patient presents with  . Annual Exam    Doing well    HPI Martin Navarro presents for physical exam.  He has history of GERD which is currently well controlled with omeprazole.  He has history of hyperlipidemia treated with Crestor.  He has had history of low testosterone and also hyperlipidemia.  Past history of smoking many years ago.  No alcohol use.  Generally doing well.  No specific complaints.  He does have some intermittent arthritis issues but not really limiting at this point.  Orthopedist had suggested consideration for knee replacement but he is not ready at this point  Social history-lives alone.  Has 1 daughter who lives nearby.  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  He is planning to build more.  Quit smoking several years ago.  No alcohol use.  Family history -Father had heart disease with bypass around age 59.  He also had hypertension history.  No known history of cancer.      Past Medical History:  Diagnosis Date  . Acute prostatitis 09/21/2016  . AKI (acute kidney injury) ([place])   . Asthma   . Chest pain   . COPD (chronic obstructive pulmonary disease) ([place])   . Dyspnea   . Emphysema of lung ([place])   . Epididymoorchitis 01/28/2017  . Erectile dysfunction   . Family history of ischemic heart disease and other diseases of the circulatory system   . GERD (gastroesophageal reflux disease) 10/25/2010  . History of leukocytosis   . Hyperlipidemia   . 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 Hypokalemia   . Nausea   . Nicotine abuse 10/25/2010  . Prostatitis   . Sepsis ([place]) 09/21/2016    History reviewed. No pertinent surgical history.  Family History  Problem Relation Age of Onset  . Hypertension Father   . Heart disease Father 9       CABG    Social History   Socioeconomic History   . Marital status: Divorced    Spouse name: Not on file  . Number of children: Not on file  . Years of education: Not on file  . Highest education level: Not on file  Occupational History  . Not on file  Tobacco Use  . Smoking status: Former Smoker    Packs/day: 2.00    Years: 53.00    Pack years: 106.00    Types: E-cigarettes    Quit date: 02/07/2015    Years since quitting: 4.8  . Smokeless tobacco: Current User  . Tobacco comment: Pt now vapes, quit tobacco 2016  Vaping Use  . Vaping Use: Every day  Substance and Sexual Activity  . Alcohol use: No  . Drug use: Not on file  . Sexual activity: Yes  Other Topics Concern  . Not on file  Social History Narrative  . Not on file   Social Determinants of Health   Financial Resource Strain:   . Difficulty of Paying Living Expenses: Not on file  Food Insecurity:   . Worried About Charity fundraiser in the Last Year: Not on file  . Ran Out of Food in the Last Year: Not on file  Transportation Needs:   . Lack of Transportation (Medical): Not on file  . Lack of Transportation (Non-Medical): Not on file  Physical Activity:   . Days of Exercise per Week: Not on file  . Minutes of Exercise per Session: Not on file  Stress:   . Feeling of Stress : Not on file  Social Connections:   . Frequency of Communication with Friends and Family: Not on file  . Frequency of Social Gatherings with Friends and Family: Not on file  . Attends Religious Services: Not on file  . Active Member of Clubs or Organizations: Not on file  . Attends Archivist Meetings: Not on file  . Marital Status: Not on file  Intimate Partner Violence:   . Fear of Current or Ex-Partner: Not on file  . Emotionally Abused: Not on file  . Physically Abused: Not on file  . Sexually Abused: Not on file    Outpatient Medications Prior to Visit  Medication Sig Dispense Refill  . aspirin 81 MG tablet Take 81 mg by mouth daily.      . meloxicam (MOBIC) 15  MG tablet Take 15 mg by mouth 3 (three) times daily.    . Omega-3 Fatty Acids (FISH OIL) 1200 MG CAPS Take 1,200 mg by mouth daily.    Marland Kitchen omeprazole (PRILOSEC) 40 MG capsule TAKE 1 CAPSULE BY MOUTH EVERY DAY 90 capsule 3  . oxymetazoline (AFRIN) 0.05 % nasal spray Place 2 sprays 2 (two) times daily as needed into both nostrils for congestion.    . rosuvastatin (CRESTOR) 10 MG tablet TAKE 1 TABLET(10 MG) BY MOUTH DAILY 90 tablet 3  . vitamin B-12 (CYANOCOBALAMIN) 1000 MCG tablet Take 1,000-5,000 mcg by mouth daily.     . vitamin E 400 UNIT capsule Take 1,200 Units by mouth daily.     No facility-administered medications prior to visit.    No Known Allergies  ROS Review of Systems  Constitutional: Negative for fatigue.  Eyes: Negative for visual disturbance.  Respiratory: Negative for cough, chest tightness and shortness of breath.   Cardiovascular: Negative for chest pain, palpitations and leg swelling.  Endocrine: Negative for polydipsia and polyuria.  Neurological: Negative for dizziness, syncope, weakness, light-headedness and headaches.      Objective:    Physical Exam Constitutional:      Appearance: He is well-developed.  HENT:     Right Ear: External ear normal.     Left Ear: External ear normal.  Eyes:     Pupils: Pupils are equal, round, and reactive to light.  Neck:     Thyroid: No thyromegaly.  Cardiovascular:     Rate and Rhythm: Normal rate and regular rhythm.  Pulmonary:     Effort: Pulmonary effort is normal. No respiratory distress.     Breath sounds: Normal breath sounds. No wheezing or rales.  Musculoskeletal:     Cervical back: Neck supple.     Right lower leg: No edema.     Left lower leg: No edema.  Neurological:     Mental Status: He is alert and oriented to person, place, and time.     BP (!) 156/80 (BP Location: Left Arm, Cuff Size: Normal)   Pulse 64   Temp 97.9 F (36.6 C) (Oral)   Ht 5' 3.6" (1.615 m)   Wt 175 lb 3.2 oz (79.5 kg)   SpO2  98%   BMI 30.45 kg/m  Wt Readings from Last 3 Encounters:  12/24/19 175 lb 3.2 oz (79.5 kg)  07/01/19 181 lb (82.1 kg)  06/20/19 181 lb 1.9 oz (82.2 kg)     Health Maintenance  Due  Topic Date Due  . COVID-19 Vaccine (2 - Pfizer 2-dose series) 12/25/2019    There are no preventive care reminders to display for this patient.  Lab Results  Component Value Date   TSH 1.90 12/23/2018   Lab Results  Component Value Date   WBC 7.9 12/23/2018   HGB 14.2 12/23/2018   HCT 42.6 12/23/2018   MCV 94.1 12/23/2018   PLT 230.0 12/23/2018   Lab Results  Component Value Date   NA 140 12/23/2018   K 4.1 12/23/2018   CO2 30 12/23/2018   GLUCOSE 95 12/23/2018   BUN 14 12/23/2018   CREATININE 0.88 12/23/2018   BILITOT 0.7 12/23/2018   ALKPHOS 62 12/23/2018   AST 12 12/23/2018   ALT 14 12/23/2018   PROT 6.8 12/23/2018   ALBUMIN 4.1 12/23/2018   CALCIUM 9.2 12/23/2018   ANIONGAP 6 01/30/2017   GFR 85.73 12/23/2018   Lab Results  Component Value Date   CHOL 148 12/23/2018   Lab Results  Component Value Date   HDL 45.00 12/23/2018   Lab Results  Component Value Date   LDLCALC 80 12/23/2018   Lab Results  Component Value Date   TRIG 115.0 12/23/2018   Lab Results  Component Value Date   CHOLHDL 3 12/23/2018   Lab Results  Component Value Date   HGBA1C 5.9 (H) 01/29/2017      Assessment & Plan:   Problem List Items Addressed This Visit    None    Visit Diagnoses    Physical exam    -  Primary   Relevant Orders   Basic metabolic panel   Lipid panel   CBC with Differential/Platelet   TSH   Hepatic function panel   PSA    He has GERD which is well controlled with omeprazole.  Hyperlipidemia treated with Crestor.  Does have elevated blood pressure today with no prior history of hypertension.  We confirmed reading of 156/80 left arm normal size cuff and this was repeated twice and confirmed reading after rest  -obtain screening labs-cleaning PSA after  discussion of risk and benefits -Recommend getting home blood pressure cuff and monitoring blood pressure and schedule follow-up in 6 weeks to reassess -Watch sodium intake -We discussed shingles vaccine but he plans to get his second Covid vaccine tomorrow and defers at this time.  Will need tetanus booster next year. -Flu vaccine offered and patient declines  No orders of the defined types were placed in this encounter.   Follow-up: Return in about 6 weeks (around 02/04/2020).    Carolann Littler, MD

## 2019-12-25 LAB — HEPATIC FUNCTION PANEL
AG Ratio: 1.8 (calc) (ref 1.0–2.5)
ALT: 14 U/L (ref 9–46)
AST: 17 U/L (ref 10–35)
Albumin: 4.4 g/dL (ref 3.6–5.1)
Alkaline phosphatase (APISO): 56 U/L (ref 35–144)
Bilirubin, Direct: 0.2 mg/dL (ref 0.0–0.2)
Globulin: 2.4 g/dL (calc) (ref 1.9–3.7)
Indirect Bilirubin: 0.5 mg/dL (calc) (ref 0.2–1.2)
Total Bilirubin: 0.7 mg/dL (ref 0.2–1.2)
Total Protein: 6.8 g/dL (ref 6.1–8.1)

## 2019-12-25 LAB — CBC WITH DIFFERENTIAL/PLATELET
Absolute Monocytes: 1101 cells/uL — ABNORMAL HIGH (ref 200–950)
Basophils Absolute: 91 cells/uL (ref 0–200)
Basophils Relative: 1 %
Eosinophils Absolute: 255 cells/uL (ref 15–500)
Eosinophils Relative: 2.8 %
HCT: 42.8 % (ref 38.5–50.0)
Hemoglobin: 14.3 g/dL (ref 13.2–17.1)
Lymphs Abs: 2539 cells/uL (ref 850–3900)
MCH: 31.2 pg (ref 27.0–33.0)
MCHC: 33.4 g/dL (ref 32.0–36.0)
MCV: 93.2 fL (ref 80.0–100.0)
MPV: 11.6 fL (ref 7.5–12.5)
Monocytes Relative: 12.1 %
Neutro Abs: 5114 cells/uL (ref 1500–7800)
Neutrophils Relative %: 56.2 %
Platelets: 223 10*3/uL (ref 140–400)
RBC: 4.59 10*6/uL (ref 4.20–5.80)
RDW: 13 % (ref 11.0–15.0)
Total Lymphocyte: 27.9 %
WBC: 9.1 10*3/uL (ref 3.8–10.8)

## 2019-12-25 LAB — BASIC METABOLIC PANEL
BUN: 14 mg/dL (ref 7–25)
CO2: 27 mmol/L (ref 20–32)
Calcium: 9.6 mg/dL (ref 8.6–10.3)
Chloride: 107 mmol/L (ref 98–110)
Creat: 0.74 mg/dL (ref 0.70–1.18)
Glucose, Bld: 112 mg/dL — ABNORMAL HIGH (ref 65–99)
Potassium: 4.2 mmol/L (ref 3.5–5.3)
Sodium: 141 mmol/L (ref 135–146)

## 2019-12-25 LAB — PSA: PSA: 0.5 ng/mL (ref <=4.0)

## 2019-12-25 LAB — LIPID PANEL
Cholesterol: 135 mg/dL (ref <200)
HDL: 43 mg/dL (ref >=40)
LDL Cholesterol (Calc): 74 mg/dL (calc)
Non-HDL Cholesterol (Calc): 92 mg/dL (calc) (ref <130)
Total CHOL/HDL Ratio: 3.1 (calc) (ref <5.0)
Triglycerides: 96 mg/dL (ref <150)

## 2019-12-25 LAB — TSH: TSH: 1.6 mIU/L (ref 0.40–4.50)

## 2019-12-30 ENCOUNTER — Encounter: Payer: Self-pay | Admitting: Family Medicine

## 2019-12-30 MED ORDER — LOSARTAN POTASSIUM 50 MG PO TABS: 50.0000 mg | ORAL_TABLET | Freq: Every day | ORAL | 5 refills | Status: DC

## 2019-12-30 NOTE — Telephone Encounter (Signed)
#  1  The increased absolute monocytes are not clinically significant  #2  Blood sugar is mildly elevated in the "prediabetes" range.   Reduce sugars and white starches.  We will check A1C at follow up  #3 based on BP readings, I will go ahead and send in BP medication (losartan 50 mg once daily) and recommend office follow up in about 4 weeks to reassess.

## 2019-12-31 ENCOUNTER — Other Ambulatory Visit: Payer: Self-pay | Admitting: Family Medicine

## 2020-01-15 ENCOUNTER — Telehealth: Payer: Self-pay | Admitting: Family Medicine

## 2020-01-15 NOTE — Telephone Encounter (Signed)
Left message for patient to schedule Annual Wellness Visit.  Please schedule with Nurse Health Advisor Shannon Crews, RN at [facility]  

## 2020-01-19 DIAGNOSIS — L820 Inflamed seborrheic keratosis: Secondary | ICD-10-CM | POA: Diagnosis not present

## 2020-01-19 DIAGNOSIS — L814 Other melanin hyperpigmentation: Secondary | ICD-10-CM | POA: Diagnosis not present

## 2020-01-19 DIAGNOSIS — X32XXXD Exposure to sunlight, subsequent encounter: Secondary | ICD-10-CM | POA: Diagnosis not present

## 2020-01-19 DIAGNOSIS — L570 Actinic keratosis: Secondary | ICD-10-CM | POA: Diagnosis not present

## 2020-01-19 DIAGNOSIS — L821 Other seborrheic keratosis: Secondary | ICD-10-CM | POA: Diagnosis not present

## 2020-02-05 ENCOUNTER — Encounter: Payer: Self-pay | Admitting: Family Medicine

## 2020-02-06 ENCOUNTER — Encounter: Payer: Self-pay | Admitting: Family Medicine

## 2020-02-06 ENCOUNTER — Ambulatory Visit (INDEPENDENT_AMBULATORY_CARE_PROVIDER_SITE_OTHER): Payer: Medicare Other | Admitting: Family Medicine

## 2020-02-06 ENCOUNTER — Other Ambulatory Visit: Payer: Self-pay

## 2020-02-06 VITALS — BP 132/84 | HR 75 | Temp 98.6°F | Ht 63.0 in | Wt 177.5 lb

## 2020-02-06 DIAGNOSIS — I10 Essential (primary) hypertension: Secondary | ICD-10-CM | POA: Diagnosis not present

## 2020-02-06 DIAGNOSIS — R739 Hyperglycemia, unspecified: Secondary | ICD-10-CM

## 2020-02-06 NOTE — Progress Notes (Signed)
Established Patient Office Visit  Subjective:  Patient ID: Martin Navarro, male    DOB: 08/07/1950  Age: 69 y.o. MRN: [id number]  CC:  Chief Complaint  Patient presents with  . Blood Pressure Check    blood check and lab work, pt has reading     HPI Martin Navarro presents for follow-up hypertension.  Recent physical and had blood pressure up in the 983 range systolic.  We had a monitor this and he called back with several elevated readings we went ahead and initiated losartan 50 mg daily.  Tolerating with no side effects.  He brings in log of home readings and these are gradually improving with most of his systolics 382 or less.  Most of his diastolics are in the 50N.  No dizziness or lightheadedness.  He did have glucose 112 with recent labs.  Eats a lot of high glycemic foods.  No polyuria or polydipsia.  Past Medical History:  Diagnosis Date  . Acute prostatitis 09/21/2016  . AKI (acute kidney injury) ([place])   . Asthma   . Chest pain   . COPD (chronic obstructive pulmonary disease) ([place])   . Dyspnea   . Emphysema of lung ([place])   . Epididymoorchitis 01/28/2017  . Erectile dysfunction   . Family history of ischemic heart disease and other diseases of the circulatory system   . GERD (gastroesophageal reflux disease) 10/25/2010  . History of leukocytosis   . Hyperlipidemia   . 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 Hypokalemia   . Nausea   . Nicotine abuse 10/25/2010  . Prostatitis   . Sepsis ([place]) 09/21/2016    Past Surgical History:  Procedure Laterality Date  . SKIN TAG REMOVAL  11/2019    Family History  Problem Relation Age of Onset  . Hypertension Father   . Heart disease Father 85       CABG    Social History   Socioeconomic History  . Marital status: Divorced    Spouse name: Not on file  . Number of children: Not on file  . Years of education: Not on file  . Highest education level: Not on file  Occupational History  . Not on file  Tobacco Use  . Smoking  status: Former Smoker    Packs/day: 2.00    Years: 53.00    Pack years: 106.00    Types: E-cigarettes    Quit date: 02/07/2015    Years since quitting: 5.0  . Smokeless tobacco: Current User  . Tobacco comment: Pt now vapes, quit tobacco 2016  Vaping Use  . Vaping Use: Every day  Substance and Sexual Activity  . Alcohol use: No  . Drug use: Not on file  . Sexual activity: Yes  Other Topics Concern  . Not on file  Social History Narrative  . Not on file   Social Determinants of Health   Financial Resource Strain:   . Difficulty of Paying Living Expenses: Not on file  Food Insecurity:   . Worried About Charity fundraiser in the Last Year: Not on file  . Ran Out of Food in the Last Year: Not on file  Transportation Needs:   . Lack of Transportation (Medical): Not on file  . Lack of Transportation (Non-Medical): Not on file  Physical Activity:   . Days of Exercise per Week: Not on file  . Minutes of Exercise per Session: Not on file  Stress:   . Feeling of Stress : Not on file  Social Connections:   . Frequency of Communication with Friends and Family: Not on file  . Frequency of Social Gatherings with Friends and Family: Not on file  . Attends Religious Services: Not on file  . Active Member of Clubs or Organizations: Not on file  . Attends Archivist Meetings: Not on file  . Marital Status: Not on file  Intimate Partner Violence:   . Fear of Current or Ex-Partner: Not on file  . Emotionally Abused: Not on file  . Physically Abused: Not on file  . Sexually Abused: Not on file    Outpatient Medications Prior to Visit  Medication Sig Dispense Refill  . aspirin 81 MG tablet Take 81 mg by mouth daily.      Marland Kitchen losartan (COZAAR) 50 MG tablet TAKE 1 TABLET(50 MG) BY MOUTH DAILY 90 tablet 1  . Omega-3 Fatty Acids (FISH OIL) 1200 MG CAPS Take 1,200 mg by mouth daily.    Marland Kitchen omeprazole (PRILOSEC) 40 MG capsule TAKE 1 CAPSULE BY MOUTH EVERY DAY 90 capsule 3  .  oxymetazoline (AFRIN) 0.05 % nasal spray Place 2 sprays 2 (two) times daily as needed into both nostrils for congestion.    . rosuvastatin (CRESTOR) 10 MG tablet TAKE 1 TABLET(10 MG) BY MOUTH DAILY 90 tablet 3  . vitamin B-12 (CYANOCOBALAMIN) 1000 MCG tablet Take 1,000-5,000 mcg by mouth daily.     . vitamin E 400 UNIT capsule Take 1,200 Units by mouth daily.    . meloxicam (MOBIC) 15 MG tablet Take 15 mg by mouth 3 (three) times daily.     No facility-administered medications prior to visit.    No Known Allergies  ROS Review of Systems  Constitutional: Negative for fatigue.  Eyes: Negative for visual disturbance.  Respiratory: Negative for cough, chest tightness and shortness of breath.   Cardiovascular: Negative for chest pain, palpitations and leg swelling.  Endocrine: Negative for polydipsia and polyuria.  Neurological: Negative for dizziness, syncope, weakness, light-headedness and headaches.      Objective:    Physical Exam Vitals reviewed.  Constitutional:      Appearance: Normal appearance.  Cardiovascular:     Rate and Rhythm: Normal rate and regular rhythm.  Pulmonary:     Effort: Pulmonary effort is normal.     Breath sounds: Normal breath sounds.  Neurological:     Mental Status: He is alert.     BP 132/84 (BP Location: Right Arm, Patient Position: Sitting, Cuff Size: Normal)   Pulse 75   Temp 98.6 F (37 C) (Oral)   Ht 5\' 3"  (1.6 m)   Wt 177 lb 8 oz (80.5 kg)   SpO2 96%   BMI 31.44 kg/m  Wt Readings from Last 3 Encounters:  02/06/20 177 lb 8 oz (80.5 kg)  12/24/19 175 lb 3.2 oz (79.5 kg)  07/01/19 181 lb (82.1 kg)     Health Maintenance Due  Topic Date Due  . COVID-19 Vaccine (2 - Pfizer 2-dose series) 12/25/2019    There are no preventive care reminders to display for this patient.  Lab Results  Component Value Date   TSH 1.60 12/24/2019   Lab Results  Component Value Date   WBC 9.1 12/24/2019   HGB 14.3 12/24/2019   HCT 42.8  12/24/2019   MCV 93.2 12/24/2019   PLT 223 12/24/2019   Lab Results  Component Value Date   NA 141 12/24/2019   K 4.2 12/24/2019   CO2 27 12/24/2019   GLUCOSE 112 (  H) 12/24/2019   BUN 14 12/24/2019   CREATININE 0.74 12/24/2019   BILITOT 0.7 12/24/2019   ALKPHOS 62 12/23/2018   AST 17 12/24/2019   ALT 14 12/24/2019   PROT 6.8 12/24/2019   ALBUMIN 4.1 12/23/2018   CALCIUM 9.6 12/24/2019   ANIONGAP 6 01/30/2017   GFR 85.73 12/23/2018   Lab Results  Component Value Date   CHOL 135 12/24/2019   Lab Results  Component Value Date   HDL 43 12/24/2019   Lab Results  Component Value Date   LDLCALC 74 12/24/2019   Lab Results  Component Value Date   TRIG 96 12/24/2019   Lab Results  Component Value Date   CHOLHDL 3.1 12/24/2019   Lab Results  Component Value Date   HGBA1C 5.9 (H) 01/29/2017      Assessment & Plan:   #1 hypertension-improving with recent addition of losartan 50 mg daily -Check basic metabolic panel -Continue close home monitoring -56-month follow-up  #2 mild hyperglycemia by recent labs -Check hemoglobin A1c -Scale back high glycemic foods  No orders of the defined types were placed in this encounter.   Follow-up: Return in about 6 months (around 08/05/2020).    Carolann Littler, MD

## 2020-02-06 NOTE — Addendum Note (Signed)
Addended by: Marrion Coy on: 02/06/2020 02:05 PM   Modules accepted: Orders

## 2020-02-07 LAB — BASIC METABOLIC PANEL
BUN: 16 mg/dL (ref 7–25)
CO2: 26 mmol/L (ref 20–32)
Calcium: 9.3 mg/dL (ref 8.6–10.3)
Chloride: 106 mmol/L (ref 98–110)
Creat: 0.92 mg/dL (ref 0.70–1.18)
Glucose, Bld: 90 mg/dL (ref 65–99)
Potassium: 4.5 mmol/L (ref 3.5–5.3)
Sodium: 140 mmol/L (ref 135–146)

## 2020-02-07 LAB — HEMOGLOBIN A1C
Hgb A1c MFr Bld: 6 % of total Hgb — ABNORMAL HIGH (ref <5.7)
Mean Plasma Glucose: 126 (calc)
eAG (mmol/L): 7 (calc)

## 2020-02-20 DIAGNOSIS — T1592XA Foreign body on external eye, part unspecified, left eye, initial encounter: Secondary | ICD-10-CM | POA: Diagnosis not present

## 2020-02-24 DIAGNOSIS — T1591XA Foreign body on external eye, part unspecified, right eye, initial encounter: Secondary | ICD-10-CM | POA: Diagnosis not present

## 2020-04-10 ENCOUNTER — Other Ambulatory Visit: Payer: Self-pay | Admitting: Family Medicine

## 2020-04-13 ENCOUNTER — Encounter: Payer: Self-pay | Admitting: Family Medicine

## 2020-04-13 DIAGNOSIS — U071 COVID-19: Secondary | ICD-10-CM

## 2020-04-14 ENCOUNTER — Telehealth (INDEPENDENT_AMBULATORY_CARE_PROVIDER_SITE_OTHER): Payer: Medicare Other | Admitting: Family Medicine

## 2020-04-14 DIAGNOSIS — R0981 Nasal congestion: Secondary | ICD-10-CM | POA: Diagnosis not present

## 2020-04-14 DIAGNOSIS — J029 Acute pharyngitis, unspecified: Secondary | ICD-10-CM | POA: Diagnosis not present

## 2020-04-14 DIAGNOSIS — J069 Acute upper respiratory infection, unspecified: Secondary | ICD-10-CM

## 2020-04-14 DIAGNOSIS — J441 Chronic obstructive pulmonary disease with (acute) exacerbation: Secondary | ICD-10-CM | POA: Diagnosis not present

## 2020-04-14 DIAGNOSIS — R52 Pain, unspecified: Secondary | ICD-10-CM | POA: Diagnosis not present

## 2020-04-14 DIAGNOSIS — R6883 Chills (without fever): Secondary | ICD-10-CM | POA: Diagnosis not present

## 2020-04-14 DIAGNOSIS — R059 Cough, unspecified: Secondary | ICD-10-CM | POA: Diagnosis not present

## 2020-04-14 NOTE — Progress Notes (Signed)
Patient ID: Martin Navarro, male   DOB: 04/22/1950, 69 y.o.   MRN: [id number]  This visit type was conducted due to national recommendations for restrictions regarding the COVID-19 pandemic in an effort to limit this patient's exposure and mitigate transmission in our community.   Virtual Visit via Telephone Note  I connected with Martin Navarro on 04/14/20 at  5:30 PM EST by telephone and verified that I am speaking with the correct person using two identifiers.   I discussed the limitations, risks, security and privacy concerns of performing an evaluation and management service by telephone and the availability of in person appointments. I also discussed with the patient that there may be a patient responsible charge related to this service. The patient expressed understanding and agreed to proceed.  Location patient: home Location provider: work or home office Participants present for the call: patient, provider, patient's daughter Martin Navarro Patient did not have a visit in the prior 7 days to address this/these issue(s).   History of Present Illness:  Patient developed symptoms Monday night of cough.  He subsequently had sore throat, fatigue, myalgias, intermittent diarrhea.  His girlfriend with whom he lives apparently developed symptoms on Saturday and she had positive rapid COVID test on Monday.  Patient went to urgent care earlier today up in [place].  He had PCR test done but was told this may take 4 or 5 days to get back.  They did a home test earlier today which came back negative.  Daughter strongly suspects he has COVID based on exposure and his current symptoms.  He does have some probable COPD though he does not take any regular inhalers.  He has longstanding history of smoking.  He was prescribed several medications in the urgent care including albuterol inhaler, Zithromax, prednisone, and codeine cough syrup.  They have not filled any of the medications thus far.  He feels he  may have some low-grade fever.  Pulse oximeter earlier today 98%.  Daughter did purchase a home pulse oximeter that they plan to monitor him regularly.  He has received COVID vaccines back in the fall but no booster yet.  He is not due for booster at this point.  His risk factors include age, hypertension, probable COPD status.   Observations/Objective: Patient sounds cheerful and well on the phone. I do not appreciate any SOB. Speech and thought processing are grossly intact. Patient reported vitals:  Assessment and Plan:  Upper respiratory symptoms.  Strongly suspect COVID infection based on exposure to girlfriend who has COVID and current symptom complex.  -Recommend plenty of fluids and rest -Continue pulse oximetry monitoring and consider ER for O2 sats dropping below 90%.  These have been stable thus far. -Daughter plans to consider repeat rapid testing by Friday if she has not heard back from PCR test. -If positive, consider referral to monoclonal antibody infusion clinic given his risk factors above.  Follow Up Instructions:  -as above.   [id number] [id number] [id number] 11-20 [id number] 21-30 I did not refer this patient for an OV in the next 24 hours for this/these issue(s).  I discussed the assessment and treatment plan with the patient. The patient was provided an opportunity to ask questions and all were answered. The patient agreed with the plan and demonstrated an understanding of the instructions.   The patient was advised to call back or seek an in-person evaluation if the symptoms worsen or if the condition fails to improve as anticipated.  I provided 18 minutes  of non-face-to-face time during this encounter.   Carolann Littler, MD

## 2020-04-20 DIAGNOSIS — J1282 Pneumonia due to coronavirus disease 2019: Secondary | ICD-10-CM | POA: Diagnosis not present

## 2020-04-21 NOTE — Telephone Encounter (Signed)
Spoke with [name] his daughter.  He did seem to turn a corner couple days ago but now seems to relapse.  O2 sats 92%.  Poor appetite.  Increased fatigue.  Increased body aches.  Still apparently has some fever.  We recommend they consider ER evaluation.  We did place referral to monoclonal antibody infusion clinic but we explained limited availability of monoclonal antibodies and other infusion therapies.  Daughter is considering going ahead and taking him into the ER for further evaluation which seems reasonable

## 2020-04-24 ENCOUNTER — Encounter (HOSPITAL_COMMUNITY): Payer: Self-pay | Admitting: Emergency Medicine

## 2020-04-24 ENCOUNTER — Other Ambulatory Visit: Payer: Self-pay

## 2020-04-24 ENCOUNTER — Emergency Department (HOSPITAL_COMMUNITY): Payer: Medicare Other

## 2020-04-24 ENCOUNTER — Inpatient Hospital Stay (HOSPITAL_COMMUNITY)
Admission: EM | Admit: 2020-04-24 | Discharge: 2020-04-28 | DRG: 177 | Disposition: A | Discharge status: Home / Self Care | Payer: Medicare Other | Attending: Internal Medicine | Admitting: Internal Medicine

## 2020-04-24 DIAGNOSIS — U071 COVID-19: Principal | ICD-10-CM | POA: Diagnosis present

## 2020-04-24 DIAGNOSIS — Z20822 Contact with and (suspected) exposure to covid-19: Secondary | ICD-10-CM | POA: Diagnosis present

## 2020-04-24 DIAGNOSIS — Z8249 Family history of ischemic heart disease and other diseases of the circulatory system: Secondary | ICD-10-CM | POA: Diagnosis not present

## 2020-04-24 DIAGNOSIS — F17210 Nicotine dependence, cigarettes, uncomplicated: Secondary | ICD-10-CM | POA: Diagnosis present

## 2020-04-24 DIAGNOSIS — J439 Emphysema, unspecified: Secondary | ICD-10-CM | POA: Diagnosis not present

## 2020-04-24 DIAGNOSIS — E785 Hyperlipidemia, unspecified: Secondary | ICD-10-CM | POA: Diagnosis present

## 2020-04-24 DIAGNOSIS — Z7982 Long term (current) use of aspirin: Secondary | ICD-10-CM | POA: Diagnosis not present

## 2020-04-24 DIAGNOSIS — I10 Essential (primary) hypertension: Secondary | ICD-10-CM | POA: Diagnosis present

## 2020-04-24 DIAGNOSIS — K219 Gastro-esophageal reflux disease without esophagitis: Secondary | ICD-10-CM | POA: Diagnosis present

## 2020-04-24 DIAGNOSIS — T380X5A Adverse effect of glucocorticoids and synthetic analogues, initial encounter: Secondary | ICD-10-CM | POA: Diagnosis not present

## 2020-04-24 DIAGNOSIS — Z974 Presence of external hearing-aid: Secondary | ICD-10-CM | POA: Diagnosis not present

## 2020-04-24 DIAGNOSIS — J9601 Acute respiratory failure with hypoxia: Secondary | ICD-10-CM | POA: Diagnosis present

## 2020-04-24 DIAGNOSIS — R0602 Shortness of breath: Secondary | ICD-10-CM | POA: Diagnosis not present

## 2020-04-24 DIAGNOSIS — J449 Chronic obstructive pulmonary disease, unspecified: Secondary | ICD-10-CM | POA: Diagnosis present

## 2020-04-24 DIAGNOSIS — J1282 Pneumonia due to coronavirus disease 2019: Secondary | ICD-10-CM | POA: Diagnosis not present

## 2020-04-24 DIAGNOSIS — Z79899 Other long term (current) drug therapy: Secondary | ICD-10-CM | POA: Diagnosis not present

## 2020-04-24 DIAGNOSIS — R0902 Hypoxemia: Secondary | ICD-10-CM

## 2020-04-24 DIAGNOSIS — H9190 Unspecified hearing loss, unspecified ear: Secondary | ICD-10-CM | POA: Diagnosis present

## 2020-04-24 DIAGNOSIS — Z882 Allergy status to sulfonamides status: Secondary | ICD-10-CM

## 2020-04-24 LAB — BASIC METABOLIC PANEL
Anion gap: 9 (ref 5–15)
BUN: 13 mg/dL (ref 8–23)
CO2: 25 mmol/L (ref 22–32)
Calcium: 8.8 mg/dL — ABNORMAL LOW (ref 8.9–10.3)
Chloride: 107 mmol/L (ref 98–111)
Creatinine, Ser: 0.75 mg/dL (ref 0.61–1.24)
GFR, Estimated: >60 mL/min (ref >60)
Glucose, Bld: 113 mg/dL — ABNORMAL HIGH (ref 70–99)
Potassium: 3.3 mmol/L — ABNORMAL LOW (ref 3.5–5.1)
Sodium: 141 mmol/L (ref 135–145)

## 2020-04-24 LAB — COMPREHENSIVE METABOLIC PANEL
ALT: 42 U/L (ref 0–44)
AST: 46 U/L — ABNORMAL HIGH (ref 15–41)
Albumin: 3.2 g/dL — ABNORMAL LOW (ref 3.5–5.0)
Alkaline Phosphatase: 62 U/L (ref 38–126)
Anion gap: 13 (ref 5–15)
BUN: 14 mg/dL (ref 8–23)
CO2: 25 mmol/L (ref 22–32)
Calcium: 8.9 mg/dL (ref 8.9–10.3)
Chloride: 104 mmol/L (ref 98–111)
Creatinine, Ser: 0.8 mg/dL (ref 0.61–1.24)
GFR, Estimated: >60 mL/min (ref >60)
Glucose, Bld: 141 mg/dL — ABNORMAL HIGH (ref 70–99)
Potassium: 4 mmol/L (ref 3.5–5.1)
Sodium: 142 mmol/L (ref 135–145)
Total Bilirubin: 0.7 mg/dL (ref 0.3–1.2)
Total Protein: 6.3 g/dL — ABNORMAL LOW (ref 6.5–8.1)

## 2020-04-24 LAB — CBC
HCT: 39.3 % (ref 39.0–52.0)
Hemoglobin: 13.5 g/dL (ref 13.0–17.0)
MCH: 31.5 pg (ref 26.0–34.0)
MCHC: 34.4 g/dL (ref 30.0–36.0)
MCV: 91.8 fL (ref 80.0–100.0)
Platelets: 205 10*3/uL (ref 150–400)
RBC: 4.28 MIL/uL (ref 4.22–5.81)
RDW: 13.2 % (ref 11.5–15.5)
WBC: 12 10*3/uL — ABNORMAL HIGH (ref 4.0–10.5)
nRBC: 0 % (ref 0.0–0.2)

## 2020-04-24 LAB — CBG MONITORING, ED: Glucose-Capillary: 207 mg/dL — ABNORMAL HIGH (ref 70–99)

## 2020-04-24 LAB — FIBRINOGEN: Fibrinogen: 533 mg/dL — ABNORMAL HIGH (ref 210–475)

## 2020-04-24 LAB — TROPONIN I (HIGH SENSITIVITY)
Troponin I (High Sensitivity): 10 ng/L (ref <18)
Troponin I (High Sensitivity): 10 ng/L (ref <18)

## 2020-04-24 LAB — LACTATE DEHYDROGENASE: LDH: 337 U/L — ABNORMAL HIGH (ref 98–192)

## 2020-04-24 LAB — FERRITIN: Ferritin: 992 ng/mL — ABNORMAL HIGH (ref 24–336)

## 2020-04-24 LAB — C-REACTIVE PROTEIN: CRP: 2.1 mg/dL — ABNORMAL HIGH (ref <1.0)

## 2020-04-24 LAB — TRIGLYCERIDES: Triglycerides: 234 mg/dL — ABNORMAL HIGH (ref <150)

## 2020-04-24 LAB — D-DIMER, QUANTITATIVE: D-Dimer, Quant: 0.39 ug/mL-FEU (ref 0.00–0.50)

## 2020-04-24 LAB — PROCALCITONIN: Procalcitonin: <0.1 ng/mL

## 2020-04-24 LAB — LACTIC ACID, PLASMA: Lactic Acid, Venous: 1.4 mmol/L (ref 0.5–1.9)

## 2020-04-24 MED ORDER — OXYCODONE HCL 5 MG PO TABS: 5.0000 mg | ORAL_TABLET | ORAL | Status: DC | PRN

## 2020-04-24 MED ORDER — ASPIRIN EC 81 MG PO TBEC
81.0000 mg | DELAYED_RELEASE_TABLET | Freq: Every day | ORAL | Status: DC
  Administered 2020-04-25 – 2020-04-28 (×4): 81 mg via ORAL
  Filled 2020-04-24 (×4): qty 1

## 2020-04-24 MED ORDER — ACETAMINOPHEN 325 MG PO TABS: 650.0000 mg | ORAL_TABLET | Freq: Four times a day (QID) | ORAL | Status: DC | PRN

## 2020-04-24 MED ORDER — INSULIN ASPART 100 UNIT/ML ~~LOC~~ SOLN
0.0000 [IU] | Freq: Three times a day (TID) | SUBCUTANEOUS | Status: DC
  Administered 2020-04-25: 3 [IU] via SUBCUTANEOUS
  Administered 2020-04-25: 2 [IU] via SUBCUTANEOUS
  Administered 2020-04-26 (×2): 3 [IU] via SUBCUTANEOUS
  Administered 2020-04-26: 2 [IU] via SUBCUTANEOUS
  Administered 2020-04-27 (×2): 3 [IU] via SUBCUTANEOUS
  Administered 2020-04-27 – 2020-04-28 (×2): 2 [IU] via SUBCUTANEOUS

## 2020-04-24 MED ORDER — LOSARTAN POTASSIUM 50 MG PO TABS
50.0000 mg | ORAL_TABLET | Freq: Every day | ORAL | Status: DC
Start: 2020-04-25 — End: 2020-04-28
  Administered 2020-04-25 – 2020-04-28 (×4): 50 mg via ORAL
  Filled 2020-04-24 (×4): qty 1

## 2020-04-24 MED ORDER — SODIUM CHLORIDE 0.9 % IV SOLN
100.0000 mg | Freq: Every day | INTRAVENOUS | Status: AC
  Administered 2020-04-25 – 2020-04-28 (×4): 100 mg via INTRAVENOUS
  Filled 2020-04-24 (×4): qty 20

## 2020-04-24 MED ORDER — SODIUM CHLORIDE 0.9% FLUSH: 3.0000 mL | INTRAVENOUS | Status: DC | PRN

## 2020-04-24 MED ORDER — PREDNISONE 20 MG PO TABS: 50.0000 mg | ORAL_TABLET | Freq: Every day | ORAL | Status: DC

## 2020-04-24 MED ORDER — POTASSIUM CHLORIDE CRYS ER 20 MEQ PO TBCR
40.0000 meq | EXTENDED_RELEASE_TABLET | Freq: Once | ORAL | Status: AC
  Administered 2020-04-24: 40 meq via ORAL
  Filled 2020-04-24: qty 2

## 2020-04-24 MED ORDER — BISACODYL 5 MG PO TBEC: 5.0000 mg | DELAYED_RELEASE_TABLET | Freq: Every day | ORAL | Status: DC | PRN

## 2020-04-24 MED ORDER — ENOXAPARIN SODIUM 40 MG/0.4ML ~~LOC~~ SOLN
40.0000 mg | SUBCUTANEOUS | Status: DC
  Administered 2020-04-24 – 2020-04-27 (×3): 40 mg via SUBCUTANEOUS
  Filled 2020-04-24 (×3): qty 0.4

## 2020-04-24 MED ORDER — ALBUTEROL SULFATE HFA 108 (90 BASE) MCG/ACT IN AERS
2.0000 | INHALATION_SPRAY | RESPIRATORY_TRACT | Status: DC | PRN
  Filled 2020-04-24: qty 6.7

## 2020-04-24 MED ORDER — METHYLPREDNISOLONE SODIUM SUCC 40 MG IJ SOLR
0.5000 mg/kg | Freq: Two times a day (BID) | INTRAMUSCULAR | Status: DC
Start: 2020-04-24 — End: 2020-04-25
  Administered 2020-04-24 – 2020-04-25 (×2): 40 mg via INTRAVENOUS
  Filled 2020-04-24 (×2): qty 1

## 2020-04-24 MED ORDER — GUAIFENESIN-DM 100-10 MG/5ML PO SYRP
10.0000 mL | ORAL_SOLUTION | ORAL | Status: DC | PRN
  Administered 2020-04-25 – 2020-04-27 (×2): 10 mL via ORAL
  Filled 2020-04-24 (×3): qty 10

## 2020-04-24 MED ORDER — HYDROCOD POLST-CPM POLST ER 10-8 MG/5ML PO SUER: 5.0000 mL | Freq: Two times a day (BID) | ORAL | Status: DC | PRN

## 2020-04-24 MED ORDER — ONDANSETRON HCL 4 MG/2ML IJ SOLN: 4.0000 mg | Freq: Four times a day (QID) | INTRAMUSCULAR | Status: DC | PRN

## 2020-04-24 MED ORDER — SODIUM CHLORIDE 0.9 % IV SOLN: 250.0000 mL | INTRAVENOUS | Status: DC | PRN

## 2020-04-24 MED ORDER — ONDANSETRON HCL 4 MG PO TABS: 4.0000 mg | ORAL_TABLET | Freq: Four times a day (QID) | ORAL | Status: DC | PRN

## 2020-04-24 MED ORDER — ASCORBIC ACID 500 MG PO TABS
500.0000 mg | ORAL_TABLET | Freq: Every day | ORAL | Status: DC
  Administered 2020-04-24 – 2020-04-28 (×5): 500 mg via ORAL
  Filled 2020-04-24 (×5): qty 1

## 2020-04-24 MED ORDER — PANTOPRAZOLE SODIUM 40 MG PO TBEC
40.0000 mg | DELAYED_RELEASE_TABLET | Freq: Every day | ORAL | Status: DC
  Administered 2020-04-25 – 2020-04-28 (×4): 40 mg via ORAL
  Filled 2020-04-24 (×4): qty 1

## 2020-04-24 MED ORDER — SODIUM CHLORIDE 0.9% FLUSH
3.0000 mL | Freq: Two times a day (BID) | INTRAVENOUS | Status: DC
  Administered 2020-04-24 – 2020-04-28 (×8): 3 mL via INTRAVENOUS

## 2020-04-24 MED ORDER — POLYETHYLENE GLYCOL 3350 17 G PO PACK: 17.0000 g | PACK | Freq: Every day | ORAL | Status: DC | PRN

## 2020-04-24 MED ORDER — SODIUM CHLORIDE 0.9% FLUSH
3.0000 mL | Freq: Two times a day (BID) | INTRAVENOUS | Status: DC
  Administered 2020-04-24: 3 mL via INTRAVENOUS

## 2020-04-24 MED ORDER — SODIUM CHLORIDE 0.9 % IV SOLN
200.0000 mg | Freq: Once | INTRAVENOUS | Status: AC
  Administered 2020-04-24: 200 mg via INTRAVENOUS
  Filled 2020-04-24: qty 200

## 2020-04-24 MED ORDER — FLEET ENEMA 7-19 GM/118ML RE ENEM: 1.0000 | ENEMA | Freq: Once | RECTAL | Status: DC | PRN

## 2020-04-24 MED ORDER — ROSUVASTATIN CALCIUM 5 MG PO TABS
10.0000 mg | ORAL_TABLET | Freq: Every day | ORAL | Status: DC
  Administered 2020-04-25 – 2020-04-28 (×4): 10 mg via ORAL
  Filled 2020-04-24 (×4): qty 2

## 2020-04-24 MED ORDER — ZINC SULFATE 220 (50 ZN) MG PO CAPS
220.0000 mg | ORAL_CAPSULE | Freq: Every day | ORAL | Status: DC
  Administered 2020-04-24 – 2020-04-28 (×5): 220 mg via ORAL
  Filled 2020-04-24 (×5): qty 1

## 2020-04-24 MED ORDER — DOCUSATE SODIUM 100 MG PO CAPS
100.0000 mg | ORAL_CAPSULE | Freq: Two times a day (BID) | ORAL | Status: DC
  Administered 2020-04-24 – 2020-04-28 (×4): 100 mg via ORAL
  Filled 2020-04-24 (×8): qty 1

## 2020-04-24 NOTE — ED Provider Notes (Signed)
  Face-to-face evaluation   History: He is here for evaluation of symptoms of upper respiratory infection with recent diagnosis of Covid, 10 days ago.  He has been following the symptoms at home by checking his oxygen saturation in the last 36 hours it has been less than 90 multiple times.  This morning it was 76, prompting him to come here by EMS.  He denies cough, nausea, vomiting, fever, chills, focal weakness or paresthesia.  Physical exam: Elderly male, alert and cooperative.  He is comfortable on oxygen without shortness of breath, increased work of breathing or altered mental status.  He is a somewhat poor historian.   Medical screening examination/treatment/procedure(s) were conducted as a shared visit with non-physician practitioner(s) and myself.  I personally evaluated the patient during the encounter   Daleen Bo, MD 04/24/20 2052

## 2020-04-24 NOTE — ED Provider Notes (Signed)
[place] EMERGENCY DEPARTMENT Provider Note   CSN: [id number] Arrival date & time: 04/24/20  1113     History Chief Complaint  Patient presents with  . Covid Positive  . Shortness of Breath    Martin Navarro is a 70 y.o. male with PMHx HTN, HLD, GERD, COPD who presents to the ED today with worsening SOB. Pt reports that on 1/19 he began feeling ill with a cough and fevers. He went to an UC and tested positive for COVID 19. He was discharged home with prednisone and a Z pack and reports improvement in his symptoms until about 5 days ago. Pt reports he began feeling ill 1 day after finishing his medications with some SOB. He went to another UC and had a CXR done with findings concerning for COVID pneumonia. Pt was discharged with dexamethasone at that time and advised to check his pulse ox to ensure he does not desaturate. Pt states that last night he began feeling more short of breath specifically at rest and his O2 was around 88%. He called his PCP today who told him to come to the ED immediately for further evaluation. Pt was found to be hypoxic at 85% on RA and placed on 2L. Pt denies any chest pain. No other complaints at this time. He is vaccinated x 2 with last dose Sept 30 2021.   The history is provided by the patient and medical records.       Past Medical History:  Diagnosis Date  . Acute prostatitis 09/21/2016  . AKI (acute kidney injury) ([place])   . Asthma   . Chest pain   . COPD (chronic obstructive pulmonary disease) ([place])   . Dyspnea   . Emphysema of lung ([place])   . Epididymoorchitis 01/28/2017  . Erectile dysfunction   . Family history of ischemic heart disease and other diseases of the circulatory system   . GERD (gastroesophageal reflux disease) 10/25/2010  . History of leukocytosis   . Hyperlipidemia   . 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 Hypokalemia   . Nausea   . Nicotine abuse 10/25/2010  . Prostatitis   . Sepsis ([place]) 09/21/2016    Patient  Active Problem List   Diagnosis Date Noted  . Hypertension 02/06/2020  . Epididymoorchitis 01/28/2017  . Hypokalemia   . AKI (acute kidney injury) ([place])   . Nausea   . Acute prostatitis 09/21/2016  . Sepsis ([place]) 09/21/2016  . 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 Erectile dysfunction 10/25/2010  . GERD (gastroesophageal reflux disease) 10/25/2010  . Hyperlipidemia 10/25/2010  . Nicotine abuse 10/25/2010    Past Surgical History:  Procedure Laterality Date  . SKIN TAG REMOVAL  11/2019       Family History  Problem Relation Age of Onset  . Hypertension Father   . Heart disease Father 110       CABG    Social History   Tobacco Use  . Smoking status: Current Every Day Smoker    Packs/day: 2.00    Years: 53.00    Pack years: 106.00    Types: E-cigarettes    Last attempt to quit: 02/07/2015    Years since quitting: 5.2  . Smokeless tobacco: Current User  . Tobacco comment: Pt now vapes, quit tobacco 2016  Vaping Use  . Vaping Use: Every day  Substance Use Topics  . Alcohol use: No  . Drug use: Not Currently    Home Medications Prior to Admission medications   Medication Sig  Start Date End Date Taking? Authorizing Provider  albuterol (VENTOLIN HFA) 108 (90 Base) MCG/ACT inhaler Inhale 2 puffs into the lungs every 6 (six) hours as needed for wheezing or shortness of breath. 04/17/20   [provider]  aspirin 81 MG tablet Take 81 mg by mouth daily.      [provider]  dexamethasone (DECADRON) 6 MG tablet Take 6 mg by mouth at bedtime. 04/20/20   [provider]  guaiFENesin-codeine 100-10 MG/5ML syrup Take 5 mLs by mouth 3 (three) times daily as needed. 04/14/20   [provider]  losartan (COZAAR) 50 MG tablet TAKE 1 TABLET(50 MG) BY MOUTH DAILY 12/31/19   Burchette, Alinda Sierras, MD  Omega-3 Fatty Acids (FISH OIL) 1200 MG CAPS Take 1,200 mg by mouth daily.    [provider]  omeprazole (PRILOSEC) 40 MG capsule TAKE 1 CAPSULE BY  MOUTH EVERY DAY 04/12/20   Burchette, Alinda Sierras, MD  oxymetazoline (AFRIN) 0.05 % nasal spray Place 2 sprays 2 (two) times daily as needed into both nostrils for congestion.    [provider]  rosuvastatin (CRESTOR) 10 MG tablet TAKE 1 TABLET(10 MG) BY MOUTH DAILY 04/12/20   Burchette, Alinda Sierras, MD  vitamin B-12 (CYANOCOBALAMIN) 1000 MCG tablet Take 1,000-5,000 mcg by mouth daily.     [provider]  vitamin E 400 UNIT capsule Take 1,200 Units by mouth daily.    [provider]    Allergies    Sulfa antibiotics  Review of Systems   Review of Systems  Constitutional: Positive for chills and fatigue.  Respiratory: Positive for cough and shortness of breath.   Cardiovascular: Negative for chest pain.  All other systems reviewed and are negative.   Physical Exam Updated Vital Signs BP 140/83   Pulse 66   Temp 98.4 F (36.9 C) (Oral)   Resp (!) 21   SpO2 93%   Physical Exam Vitals and nursing note reviewed.  Constitutional:      Appearance: He is not ill-appearing or diaphoretic.  HENT:     Head: Normocephalic and atraumatic.  Eyes:     Conjunctiva/sclera: Conjunctivae normal.  Cardiovascular:     Rate and Rhythm: Normal rate and regular rhythm.     Pulses: Normal pulses.  Pulmonary:     Effort: Tachypnea present.     Breath sounds: Decreased air movement present.     Comments: Speaking in complete sentences with mild tachypnea. Currently on 2L satting 93%. Lungs with some diminished breath sounds however no rales, rhonchi, wheezing.  Abdominal:     Palpations: Abdomen is soft.     Tenderness: There is no abdominal tenderness. There is no guarding or rebound.  Musculoskeletal:     Cervical back: Neck supple.     Right lower leg: No edema.     Left lower leg: No edema.  Skin:    General: Skin is warm and dry.  Neurological:     Mental Status: He is alert.     ED Results / Procedures / Treatments   Labs (all labs ordered are listed, but only  abnormal results are displayed) Labs Reviewed  BASIC METABOLIC PANEL - Abnormal; Notable for the following components:      Result Value   Potassium 3.3 (*)    Glucose, Bld 113 (*)    Calcium 8.8 (*)    All other components within normal limits  CBC - Abnormal; Notable for the following components:   WBC 12.0 (*)  All other components within normal limits  COMPREHENSIVE METABOLIC PANEL - Abnormal; Notable for the following components:   Glucose, Bld 141 (*)    Total Protein 6.3 (*)    Albumin 3.2 (*)    AST 46 (*)    All other components within normal limits  LACTATE DEHYDROGENASE - Abnormal; Notable for the following components:   LDH 337 (*)    All other components within normal limits  FERRITIN - Abnormal; Notable for the following components:   Ferritin 992 (*)    All other components within normal limits  FIBRINOGEN - Abnormal; Notable for the following components:   Fibrinogen 533 (*)    All other components within normal limits  C-REACTIVE PROTEIN - Abnormal; Notable for the following components:   CRP 2.1 (*)    All other components within normal limits  TRIGLYCERIDES - Abnormal; Notable for the following components:   Triglycerides 234 (*)    All other components within normal limits  CULTURE, BLOOD (ROUTINE X 2)  CULTURE, BLOOD (ROUTINE X 2)  LACTIC ACID, PLASMA  D-DIMER, QUANTITATIVE (NOT AT [facility])  PROCALCITONIN  LACTIC ACID, PLASMA  TROPONIN I (HIGH SENSITIVITY)  TROPONIN I (HIGH SENSITIVITY)    EKG None  Radiology DG Chest Portable 1 View  Result Date: 04/24/2020 CLINICAL DATA:  Shortness of breath on exertion. Hypoxia. COVID-19 positive test on 04/14/2020. EXAM: PORTABLE CHEST 1 VIEW COMPARISON:  Chest CT dated 04/07/2019. FINDINGS: Normal sized heart. Mild diffuse prominence of the interstitial markings without Kerley lines. No pleural fluid. Diffuse osteopenia. IMPRESSION: Mild chronic interstitial lung disease with possible mild superimposed  interstitial pneumonitis. Electronically Signed   By: Claudie Revering M.D.   On: 04/24/2020 12:07    Procedures Procedures   Medications Ordered in ED Medications  remdesivir 200 mg in sodium chloride 0.9% 250 mL IVPB (has no administration in time range)    Followed by  remdesivir 100 mg in sodium chloride 0.9 % 100 mL IVPB (has no administration in time range)    ED Course  I have reviewed the triage vital signs and the nursing notes.  Pertinent labs & imaging results that were available during my care of the patient were reviewed by me and considered in my medical decision making (see chart for details).    MDM Rules/Calculators/A&P                          70 year old male who presents to the ED today with complaint of worsening shortness of breath and hypoxia with pulse ox 88% on room air at home. Tested positive for COVID-19 on 1/19 at outside facility, able to see this in our records with chart review at [facility] urgent care at [facility]. Was improved with oral prednisone and Z-Pak however started feeling more short of breath a couple of days ago, has been checking his oximeter at home with decreased saturation. Advised to come to the ED today for further evaluation. Patient was found to be hypoxic at 85% on room air on arrival and placed on 2 L. Made her vitals stable besides mild tachypnea with respiration of 21. On my exam patient is able to speak in full sentences without difficulty. He is satting approximately 93% on 2 L. His lungs are clear to auscultation bilaterally. We will plan for admission at this time given hypoxia and COVID-19 status. Will obtain preadmission labs.  Chest x-ray with findings concerning for possible pneumonitis chronic interstitial lung disease.  It appears remdesivir has been ordered preemtively by ED pharmacist; agree with plan.  CBC with a mild leukocytosis of 12,000 however I suspect this is related to patient's prednisone use  BMP with potassium 3.3;  will replete.   Inflammatory markers elevated today - fibrinogen, LDH, CRP, and ferritin. Will cal for admission.   Discussed case with [facility] Dr. Lorin Mercy who agrees to evaluate patient for admission.   This note was prepared using Dragon voice recognition software and may include unintentional dictation errors due to the inherent limitations of voice recognition software.  Martin Navarro was evaluated in Emergency Department on 04/24/2020 for the symptoms described in the history of present illness. He was evaluated in the context of the global COVID-19 pandemic, which necessitated consideration that the patient might be at risk for infection with the SARS-CoV-2 virus that causes COVID-19. Institutional protocols and algorithms that pertain to the evaluation of patients at risk for COVID-19 are in a state of rapid change based on information released by regulatory bodies including the CDC and federal and state organizations. These policies and algorithms were followed during the patient's care in the ED.   Final Clinical Impression(s) / ED Diagnoses Final diagnoses:  COVID-19  Hypoxia    Rx / DC Orders ED Discharge Orders    None       Eustaquio Maize, PA-C 04/24/20 1619    Daleen Bo, MD 04/24/20 2051

## 2020-04-24 NOTE — H&P (Signed)
History and Physical    Martin Navarro:[id number] DOB: January 28, 1951 DOA: 04/24/2020  PCP: [name], MD Consultants:  [place] - orthopedics; Tamala Julian - cardiology Patient coming from:  Home - lives with ; NOK: Daughter, Martin Navarro, [phone]  Chief Complaint: worsening COVID symptoms  HPI: Martin Navarro is a 70 y.o. male with medical history significant of psoriasis; HLD; COPD presenting with worsening COVID symptoms.  He developed symptoms on 1/18, after his girlfriend tested positive for COVID.  He was seen on virtual visit on 1/19 and treated with azithromycin and prednisone taper plus Robitussin AC; COVID test was POSITIVE and is available in Care Everywhere.  He became extremely fatigued about 1/25 with severe anorexia, myalgias.  He was seen at [facility] and diagnosed with COVID PNA, fever 101.6.  He was given dexamethasone 6 Mg and Albuterol.  His O2 sats, which had been holding steady at 92%, dropped to 76% at home this AM.      ED Course: COVID POSITIVE on 1/19.  Given prednisone, Azithromycin.  Worse Monday - SOB, fatigue.  CXR with PNA, encouraged pulse ox.  88% last night, 85% here, currently on 2L [place] O2.  CXR with ILD, ?mild pneumonitis, lungs sound clear.  Given remdesivir, continued dexamethasone PO from urgent care.  Review of Systems: As per HPI; otherwise review of systems reviewed and negative.   Ambulatory Status:  Ambulates without assistance  COVID Vaccine Status:  Complete (September 2021)  Past Medical History:  Diagnosis Date  . Acute prostatitis 09/21/2016  . AKI (acute kidney injury) ([place])   . Asthma   . Chest pain   . COPD (chronic obstructive pulmonary disease) ([place])   . Dyspnea   . Emphysema of lung ([place])   . Epididymoorchitis 01/28/2017  . Erectile dysfunction   . Family history of ischemic heart disease and other diseases of the circulatory system   . GERD (gastroesophageal reflux disease) 10/25/2010  . History of leukocytosis    . Hyperlipidemia   . 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 Hypokalemia   . Nausea   . Nicotine abuse 10/25/2010  . Prostatitis   . Sepsis ([place]) 09/21/2016    Past Surgical History:  Procedure Laterality Date  . SKIN TAG REMOVAL  11/2019    Social History   Socioeconomic History  . Marital status: Divorced    Spouse name: Not on file  . Number of children: Not on file  . Years of education: Not on file  . Highest education level: Not on file  Occupational History  . Not on file  Tobacco Use  . Smoking status: Current Every Day Smoker    Packs/day: 2.00    Years: 53.00    Pack years: 106.00    Types: E-cigarettes    Last attempt to quit: 02/07/2015    Years since quitting: 5.2  . Smokeless tobacco: Current User  . Tobacco comment: Pt now vapes, quit tobacco 2016  Vaping Use  . Vaping Use: Every day  Substance and Sexual Activity  . Alcohol use: No  . Drug use: Not Currently  . Sexual activity: Yes  Other Topics Concern  . Not on file  Social History Narrative  . Not on file   Social Determinants of Health   Financial Resource Strain: Not on file  Food Insecurity: Not on file  Transportation Needs: Not on file  Physical Activity: Not on file  Stress: Not on file  Social Connections: Not on file  Intimate Partner Violence: Not  on file    Allergies  Allergen Reactions  . Sulfa Antibiotics     Unknown    Family History  Problem Relation Age of Onset  . Hypertension Father   . Heart disease Father 74       CABG    Prior to Admission medications   Medication Sig Start Date End Date Taking? Authorizing Provider  aspirin 81 MG tablet Take 81 mg by mouth daily.      [provider]  losartan (COZAAR) 50 MG tablet TAKE 1 TABLET(50 MG) BY MOUTH DAILY 12/31/19   Burchette, Alinda Sierras, MD  Omega-3 Fatty Acids (FISH OIL) 1200 MG CAPS Take 1,200 mg by mouth daily.    [provider]  omeprazole (PRILOSEC) 40 MG capsule TAKE 1 CAPSULE BY MOUTH EVERY DAY  04/12/20   Burchette, Alinda Sierras, MD  oxymetazoline (AFRIN) 0.05 % nasal spray Place 2 sprays 2 (two) times daily as needed into both nostrils for congestion.    [provider]  rosuvastatin (CRESTOR) 10 MG tablet TAKE 1 TABLET(10 MG) BY MOUTH DAILY 04/12/20   Burchette, Alinda Sierras, MD  vitamin B-12 (CYANOCOBALAMIN) 1000 MCG tablet Take 1,000-5,000 mcg by mouth daily.     [provider]  vitamin E 400 UNIT capsule Take 1,200 Units by mouth daily.    [provider]    Physical Exam: Vitals:   04/24/20 1410 04/24/20 1415 04/24/20 1500 04/24/20 1545  BP: (!) 146/98 (!) 148/91 130/80 140/83  Pulse: 77 78 75 66  Resp: (!) 23 (!) 22 (!) 21 (!) 21  Temp: 98.4 F (36.9 C)     TempSrc: Oral     SpO2: 92% 94% 95% 93%     . General:  Appears calm and comfortable and is in NAD, on 2L [place] O2 . Eyes:  PERRL, EOMI, normal lids, iris . ENT:  grossly normal hearing, lips & tongue, mmm; some absent dentition . Neck:  no LAD, masses or thyromegaly . Cardiovascular:  RRR, no m/r/g. No LE edema.  Marland Kitchen Respiratory:   CTA bilaterally with no wheezes/rales/rhonchi.  Normal to mildly increased respiratory effort. . Abdomen:  soft, NT, ND, NABS . Back:   normal alignment . Skin:  no rash or induration seen on limited exam . Musculoskeletal:  grossly normal tone BUE/BLE, good ROM, no bony abnormality . Psychiatric:  grossly normal mood and affect, speech fluent and appropriate, AOx3 . Neurologic:  CN 2-12 grossly intact, moves all extremities in coordinated fashion    Radiological Exams on Admission: Independently reviewed - see discussion in A/P where applicable  DG Chest Portable 1 View  Result Date: 04/24/2020 CLINICAL DATA:  Shortness of breath on exertion. Hypoxia. COVID-19 positive test on 04/14/2020. EXAM: PORTABLE CHEST 1 VIEW COMPARISON:  Chest CT dated 04/07/2019. FINDINGS: Normal sized heart. Mild diffuse prominence of the interstitial markings without Kerley lines. No  pleural fluid. Diffuse osteopenia. 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 Electronically Signed   By: Claudie Revering M.D.   On: 04/24/2020 12:07    EKG: Independently reviewed.  NSR with rate 88; no evidence of acute ischemia   Labs on Admission: I have personally reviewed the available labs and imaging studies at the time of the admission.  Pertinent labs:   Glucose 141 Albumin 3.2 AST 4/ALT 42 LDH 337 HS troponin 10, 10 Ferritin 992 CRP 2.1  Lactate 1.4 WBC 12.0 D-dimer 0.39 Fibrinogen 533 Blood cultures pending A2c 6.0 on 02/06/20  Normal TSH 11/2019   Assessment/Plan Principal Problem:   Acute hypoxemic respiratory failure due to COVID-19 [facility]) Active Problems:   Hyperlipidemia   Hypertension   Acute respiratory failure with hypoxia due to COVID-19 PNA -Patient with presenting with SOB and reported fever at home; known COVID and worsening SOB with hypoxia in the last day -He does not have a usual home O2 requirement and is currently requiring 2L  O2 -COVID POSITIVE -The patient has comorbidities which may increase the risk for ARDS/MODS including: age, HTN, obesity, COPD -Pertinent labs concerning for COVID include increased LFTs; increased LDH; increased ferritin; low procalcitonin; elevated CRP (not >7); increased fibrinogen -CXR with multifocal opacities which may be c/w COVID but was read as interstitial lung disease - recommend f/u CXR once he is recovered from acute infection, as he may need outpatient pulmonology referral (ILD was not present on his lung CA screening CT from 03/2019 and he is due for his next soon) -Will not treat with broad-spectrum antibiotics given procalcitonin <0.5 -Will admit for further evaluation, close monitoring, and treatment -Monitor on telemetry x at least 24 hours -At this time, will attempt to avoid use of aerosolized medications and use HFAs instead -Will check  daily labs including BMP with Mag, Phos; LFTs; CBC with differential; CRP; ferritin; fibrinogen; D-dimer -Will order steroids and Remdesivir (pharmacy consult) given +COVID test, +CXR, and hypoxia <94% on room air -If the patient shows clinical deterioration, consider transfer to ICU with PCCM consultation -Consider IL-6 agonist (Actemra) and/or JAK inhibitor (baricitinib) if the patient does not stabilize on current treatment or if the patient has marked clinical decompensation; the patient does not appear to require this treatment at this time -Hopefully he will improve quickly given his 2 vaccination status -Will attempt to maintain euvolemia to a net negative fluid status -Will ask the patient to maintain an awake prone position as much as possible -PT/OT consults -Encourage mobilization/ambulation as much as possible -Patient was seen wearing full PPE including: gown, gloves, head cover, N95, and face shield; donning and doffing was in compliance with current standards.  Borderline DM -Recent A1c 6.0 -May utilize continuous glucose monitoring -Cover with moderate-scale SSI (anticipate hyperglycemia from steroids)  HLD -Continue Crestor  HTN -Continue Cozaar  COPD -Continue Albuterol -He is due for his annual lung cancer screening and will plan to do that after recovery from current infection   Obesity -BMI is 31.4 -Weight loss should be encouraged -Outpatient PCP/bariatric medicine f/u encouraged    Level of care: Telemetry Medical DVT prophylaxis:  Lovenox  Code Status:  Full - confirmed with patient Family Communication: None present; I spoke with the patient's daughter by telephone. Disposition Plan:  The patient is from: home  Anticipated d/c is to: home without [facility] services once his respiratory issues have been resolved.  He may require home O2 at the time of discharge.  Anticipated d/c date will depend on clinical response to treatment, likely between 3 days (with  completion of outpatient Remdesivir treatment) and 5 days  Patient is currently: acutely ill Consults called: PT/OT  Admission status: Admit - It is my clinical opinion that admission to [place] is reasonable and necessary because of the expectation that this patient will require hospital care that crosses at least 2 midnights to treat this condition based on the medical complexity of the problems presented.  Given the aforementioned information, the predictability of an adverse outcome is felt to be significant.       Karmen Bongo  MD [facility]   How to contact the [facility] Attending or Consulting provider [place] or covering provider during after hours [place], for this patient?  1. Check the care team in [facility] and look for a) attending/consulting TR[facility] provider listed and b) the [facility] team listed 2. Log into [email or web address] and use [place]'s universal password to access. If you do not have the password, please contact the hospital operator. 3. Locate the [facility] provider you are looking for under [facility] and page to a number that you can be directly reached. 4. If you still have difficulty reaching the provider, please page the [facility] (Director on Call) for the Hospitalists listed on amion for assistance.   04/24/2020, 5:25 PM

## 2020-04-24 NOTE — ED Notes (Signed)
Pt given labeled denture cup for hearing aids

## 2020-04-24 NOTE — ED Triage Notes (Signed)
Tested + for COVID on 1/19.  C/o SOB on exertion.  Sats 76% at home this morning.  Denies pain.

## 2020-04-24 NOTE — ED Notes (Signed)
Pt placed on 2L [place] per RN, Richarda Blade.

## 2020-04-25 DIAGNOSIS — U071 COVID-19: Principal | ICD-10-CM

## 2020-04-25 DIAGNOSIS — J9601 Acute respiratory failure with hypoxia: Secondary | ICD-10-CM | POA: Diagnosis not present

## 2020-04-25 LAB — COMPREHENSIVE METABOLIC PANEL
ALT: 40 U/L (ref 0–44)
AST: 33 U/L (ref 15–41)
Albumin: 2.8 g/dL — ABNORMAL LOW (ref 3.5–5.0)
Alkaline Phosphatase: 59 U/L (ref 38–126)
Anion gap: 12 (ref 5–15)
BUN: 14 mg/dL (ref 8–23)
CO2: 24 mmol/L (ref 22–32)
Calcium: 8.8 mg/dL — ABNORMAL LOW (ref 8.9–10.3)
Chloride: 105 mmol/L (ref 98–111)
Creatinine, Ser: 0.66 mg/dL (ref 0.61–1.24)
GFR, Estimated: >60 mL/min (ref >60)
Glucose, Bld: 180 mg/dL — ABNORMAL HIGH (ref 70–99)
Potassium: 4.4 mmol/L (ref 3.5–5.1)
Sodium: 141 mmol/L (ref 135–145)
Total Bilirubin: 0.7 mg/dL (ref 0.3–1.2)
Total Protein: 6.1 g/dL — ABNORMAL LOW (ref 6.5–8.1)

## 2020-04-25 LAB — C-REACTIVE PROTEIN: CRP: 2.5 mg/dL — ABNORMAL HIGH (ref <1.0)

## 2020-04-25 LAB — BLOOD CULTURE ID PANEL (REFLEXED) - BCID2

## 2020-04-25 LAB — CBC WITH DIFFERENTIAL/PLATELET
Abs Immature Granulocytes: 0.04 10*3/uL (ref 0.00–0.07)
Basophils Absolute: 0 10*3/uL (ref 0.0–0.1)
Basophils Relative: 0 %
Eosinophils Absolute: 0 10*3/uL (ref 0.0–0.5)
Eosinophils Relative: 0 %
HCT: 38.6 % — ABNORMAL LOW (ref 39.0–52.0)
Hemoglobin: 13 g/dL (ref 13.0–17.0)
Immature Granulocytes: 1 %
Lymphocytes Relative: 10 %
Lymphs Abs: 0.8 10*3/uL (ref 0.7–4.0)
MCH: 31.6 pg (ref 26.0–34.0)
MCHC: 33.7 g/dL (ref 30.0–36.0)
MCV: 93.7 fL (ref 80.0–100.0)
Monocytes Absolute: 0.5 10*3/uL (ref 0.1–1.0)
Monocytes Relative: 7 %
Neutro Abs: 6.2 10*3/uL (ref 1.7–7.7)
Neutrophils Relative %: 82 %
Platelets: 202 10*3/uL (ref 150–400)
RBC: 4.12 MIL/uL — ABNORMAL LOW (ref 4.22–5.81)
RDW: 13.1 % (ref 11.5–15.5)
WBC: 7.5 10*3/uL (ref 4.0–10.5)
nRBC: 0 % (ref 0.0–0.2)

## 2020-04-25 LAB — MAGNESIUM: Magnesium: 1.8 mg/dL (ref 1.7–2.4)

## 2020-04-25 LAB — CBG MONITORING, ED
Glucose-Capillary: 137 mg/dL — ABNORMAL HIGH (ref 70–99)
Glucose-Capillary: 181 mg/dL — ABNORMAL HIGH (ref 70–99)

## 2020-04-25 LAB — FERRITIN: Ferritin: 916 ng/mL — ABNORMAL HIGH (ref 24–336)

## 2020-04-25 LAB — GLUCOSE, CAPILLARY: Glucose-Capillary: 171 mg/dL — ABNORMAL HIGH (ref 70–99)

## 2020-04-25 LAB — HIV ANTIBODY (ROUTINE TESTING W REFLEX): HIV Screen 4th Generation wRfx: NONREACTIVE

## 2020-04-25 LAB — BRAIN NATRIURETIC PEPTIDE: B Natriuretic Peptide: 169.5 pg/mL — ABNORMAL HIGH (ref 0.0–100.0)

## 2020-04-25 LAB — PHOSPHORUS: Phosphorus: 3.3 mg/dL (ref 2.5–4.6)

## 2020-04-25 LAB — D-DIMER, QUANTITATIVE: D-Dimer, Quant: 0.31 ug/mL-FEU (ref 0.00–0.50)

## 2020-04-25 MED ORDER — SODIUM CHLORIDE 0.9 % IV SOLN
2.0000 g | INTRAVENOUS | Status: DC
  Administered 2020-04-25 – 2020-04-27 (×3): 2 g via INTRAVENOUS
  Filled 2020-04-25 (×4): qty 20

## 2020-04-25 MED ORDER — METHYLPREDNISOLONE SODIUM SUCC 40 MG IJ SOLR
40.0000 mg | Freq: Two times a day (BID) | INTRAMUSCULAR | Status: DC
  Administered 2020-04-25 – 2020-04-28 (×7): 40 mg via INTRAVENOUS
  Filled 2020-04-25 (×7): qty 1

## 2020-04-25 NOTE — Progress Notes (Signed)
PHARMACY - PHYSICIAN COMMUNICATION CRITICAL VALUE ALERT - BLOOD CULTURE IDENTIFICATION (BCID)  Martin Navarro is an 70 y.o. male who presented to [facility] on 04/24/2020 with a chief complaint of COVID  Assessment:  Blood cultures 2 of 4 bottles with strep species on BCID, no resistance detected.  Name of physician (or Provider) Contacted: Candiss Norse, P  Current antibiotics: none  Changes to prescribed antibiotics recommended:  Added ceftriaxone 2g IV q 24 hrs.  Results for orders placed or performed during the hospital encounter of 04/24/20  Blood Culture ID Panel (Reflexed) (Collected: 04/24/2020 11:34 PM)  Result Value Ref Range   Enterococcus faecalis NOT DETECTED NOT DETECTED   Enterococcus Faecium NOT DETECTED NOT DETECTED   Listeria monocytogenes NOT DETECTED NOT DETECTED   Staphylococcus species NOT DETECTED NOT DETECTED   Staphylococcus aureus (BCID) NOT DETECTED NOT DETECTED   Staphylococcus epidermidis NOT DETECTED NOT DETECTED   Staphylococcus lugdunensis NOT DETECTED NOT DETECTED   Streptococcus species DETECTED (A) NOT DETECTED   Streptococcus agalactiae NOT DETECTED NOT DETECTED   Streptococcus pneumoniae NOT DETECTED NOT DETECTED   Streptococcus pyogenes NOT DETECTED NOT DETECTED   A.calcoaceticus-baumannii NOT DETECTED NOT DETECTED   Bacteroides fragilis NOT DETECTED NOT DETECTED   Enterobacterales NOT DETECTED NOT DETECTED   Enterobacter cloacae complex NOT DETECTED NOT DETECTED   Escherichia coli NOT DETECTED NOT DETECTED   Klebsiella aerogenes NOT DETECTED NOT DETECTED   Klebsiella oxytoca NOT DETECTED NOT DETECTED   Klebsiella pneumoniae NOT DETECTED NOT DETECTED   Proteus species NOT DETECTED NOT DETECTED   Salmonella species NOT DETECTED NOT DETECTED   Serratia marcescens NOT DETECTED NOT DETECTED   Haemophilus influenzae NOT DETECTED NOT DETECTED   Neisseria meningitidis NOT DETECTED NOT DETECTED   Pseudomonas aeruginosa NOT DETECTED NOT DETECTED    Stenotrophomonas maltophilia NOT DETECTED NOT DETECTED   Candida albicans NOT DETECTED NOT DETECTED   Candida auris NOT DETECTED NOT DETECTED   Candida glabrata NOT DETECTED NOT DETECTED   Candida krusei NOT DETECTED NOT DETECTED   Candida parapsilosis NOT DETECTED NOT DETECTED   Candida tropicalis NOT DETECTED NOT DETECTED   Cryptococcus neoformans/gattii NOT DETECTED NOT DETECTED   Uvaldo Rising, BCPS, BCCP Clinical Pharmacist  04/25/2020 9:07 PM   [facility] pharmacy phone numbers are listed on [email or web address]

## 2020-04-25 NOTE — ED Notes (Signed)
Called pt dtr to answer question about insulin and to let her know pt would be moving to 5W.

## 2020-04-25 NOTE — ED Notes (Signed)
Tele Breakfast order placed 

## 2020-04-25 NOTE — Progress Notes (Signed)
PROGRESS NOTE                                                                                                                                                                                                             Patient Demographics:    Martin Navarro, is a 70 y.o. male, DOB - [birth year], [id number]  Outpatient Primary MD for the patient is Martin Post, MD    LOS - 1  Admit date - 04/24/2020    Chief Complaint  Patient presents with  . Covid Positive  . Shortness of Breath       Brief Narrative (HPI from H&P) -  Martin Navarro is a 70 y.o. male with medical history significant of psoriasis; HLD; COPD presenting with worsening COVID symptoms.  He developed symptoms on 1/18, after his girlfriend tested positive for COVID, used outpatient prednisone and azithromycin for a few days without much help.  In the ER was diagnosed with acute hypoxic respiratory failure due to COVID-19 pneumonia and admitted to the hospital.   Subjective:    Martin Navarro today has, No headache, No chest pain, No abdominal pain - No Nausea, No new weakness tingling or numbness, improved SOB   Assessment  & Plan :     1. Acute Hypoxic Resp. Failure due to Acute Covid 19 Viral Pneumonitis patient who has taken 2 shots of mRNA vaccine so far - he had moderate parenchymal lung injury, currently requiring 1 to 2 L of oxygen, on steroids and Remdesivir which will be continued.  If there is a significant decline he has consented for Actemra use.  Encouraged the patient to sit up in chair in the daytime use I-S and flutter valve for pulmonary toiletry and then prone in bed when at night.  Will advance activity and titrate down oxygen as possible.  Actemra/Baricitinib  off label use - patient was told that if COVID-19 pneumonitis gets worse we might potentially use Actemra off label, patient denies any known history of active diverticulitis,  tuberculosis or hepatitis, understands the risks and benefits and wants to proceed with Actemra treatment if required.     SpO2: 92 % O2 Flow Rate (L/min): 2 L/min (Simultaneous filing. User may not have seen previous data.)  Recent Labs  Lab 04/24/20 1130 04/24/20 1435 04/25/20 0243  WBC 12.0*  --  7.5  HGB 13.5  --  13.0  HCT 39.3  --  38.6*  PLT 205  --  202  CRP  --  2.1* 2.5*  BNP  --   --  169.5*  DDIMER  --  0.39 0.31  PROCALCITON  --  <0.10  --   AST  --  46* 33  ALT  --  42 40  ALKPHOS  --  62 59  BILITOT  --  0.7 0.7  ALBUMIN  --  3.2* 2.8*  LATICACIDVEN  --  1.4  --     2.  Severe hearing loss.  Use hearing aids.  3.  Essential hypertension.  On ARB.  4.  Dyslipidemia.  On statin.  5.  GERD.  PPI.  6.  History of COPD.  Supportive care.  He is vaping, counseled to quit.        Condition - Extremely Guarded  Family Communication  :  Daughter [phone] on 04/25/20  Code Status : Full  Consults  :  None  Procedures  :    PUD Prophylaxis : PPI  Disposition Plan  :    Status is: Inpatient  Remains inpatient appropriate because:IV treatments appropriate due to intensity of illness or inability to take PO   Dispo: The patient is from: Home              Anticipated d/c is to: Home              Anticipated d/c date is: 2 days              Patient currently is not medically stable to d/c.   Difficult to place patient No   DVT Prophylaxis  :  Lovenox    Lab Results  Component Value Date   PLT 202 04/25/2020    Diet :  Diet Order            Diet regular Room service appropriate? Yes; Fluid consistency: Thin  Diet effective now                  Inpatient Medications  Scheduled Meds: . vitamin C  500 mg Oral Daily  . aspirin EC  81 mg Oral Daily  . docusate sodium  100 mg Oral BID  . enoxaparin (LOVENOX) injection  40 mg Subcutaneous Q24H  . insulin aspart  0-15 Units Subcutaneous TID WC  . losartan  50 mg Oral Daily  .  methylPREDNISolone (SOLU-MEDROL) injection  40 mg Intravenous BID  . pantoprazole  40 mg Oral Daily  . rosuvastatin  10 mg Oral Daily  . sodium chloride flush  3 mL Intravenous Q12H  . zinc sulfate  220 mg Oral Daily   Continuous Infusions: . remdesivir 100 mg in NS 100 mL     PRN Meds:.acetaminophen, albuterol, bisacodyl, chlorpheniramine-HYDROcodone, guaiFENesin-dextromethorphan, [DISCONTINUED] ondansetron **OR** ondansetron (ZOFRAN) IV, polyethylene glycol, sodium phosphate  Antibiotics  :    Anti-infectives (From admission, onward)   Start     Dose/Rate Route Frequency Ordered Stop   04/25/20 1000  remdesivir 100 mg in sodium chloride 0.9 % 100 mL IVPB       "Followed by" Linked Group Details   100 mg 200 mL/hr over 30 Minutes Intravenous Daily 04/24/20 1332 04/29/20 0959   04/24/20 1345  remdesivir 200 mg in sodium chloride 0.9% 250 mL IVPB       "Followed by" Linked Group Details   200 mg 580 mL/hr over 30 Minutes Intravenous  Once 04/24/20 1332 04/24/20 1828       Time Spent in minutes  30   Martin Navarro M.D on 04/25/2020 at 9:06 AM  To page go to [email or web address]   [facility] -  Office  [phone]   See all Orders from today for further details    Objective:   Vitals:   04/25/20 0600 04/25/20 0630 04/25/20 0700 04/25/20 0827  BP: 124/63 128/76 118/73 132/73  Pulse: 63 (!) 54 (!) 50 78  Resp: 19 16 17 16   Temp:    97.8 F (36.6 C)  TempSrc:    Oral  SpO2: 96% 93% 97% 92%  Weight:      Height:        Wt Readings from Last 3 Encounters:  04/24/20 80 kg  02/06/20 80.5 kg  12/24/19 79.5 kg     Intake/Output Summary (Last 24 hours) at 04/25/2020 0906 Last data filed at 04/24/2020 2002 Gross per 24 hour  Intake 730 ml  Output 990 ml  Net -260 ml     Physical Exam  Awake Alert, No new F.N deficits, Normal affect [place].AT,PERRAL Supple Neck,No JVD, No cervical lymphadenopathy appriciated.  Symmetrical Chest wall movement, Good air movement  bilaterally, CTAB RRR,No Gallops,Rubs or new Murmurs, No Parasternal Heave +ve B.Sounds, Abd Soft, No tenderness, No organomegaly appriciated, No rebound - guarding or rigidity. No Cyanosis, Clubbing or edema, No new Rash or bruise       Data Review:    CBC Recent Labs  Lab 04/24/20 1130 04/25/20 0243  WBC 12.0* 7.5  HGB 13.5 13.0  HCT 39.3 38.6*  PLT 205 202  MCV 91.8 93.7  MCH 31.5 31.6  MCHC 34.4 33.7  RDW 13.2 13.1  LYMPHSABS  --  0.8  MONOABS  --  0.5  EOSABS  --  0.0  BASOSABS  --  0.0    Recent Labs  Lab 04/24/20 1130 04/24/20 1435 04/25/20 0243  NA 141 142 141  K 3.3* 4.0 4.4  CL 107 104 105  CO2 25 25 24   GLUCOSE 113* 141* 180*  BUN 13 14 14   CREATININE 0.75 0.80 0.66  CALCIUM 8.8* 8.9 8.8*  AST  --  46* 33  ALT  --  42 40  ALKPHOS  --  62 59  BILITOT  --  0.7 0.7  ALBUMIN  --  3.2* 2.8*  MG  --   --  1.8  CRP  --  2.1* 2.5*  DDIMER  --  0.39 0.31  PROCALCITON  --  <0.10  --   LATICACIDVEN  --  1.4  --   BNP  --   --  169.5*    ------------------------------------------------------------------------------------------------------------------ Recent Labs    04/24/20 1435  TRIG 234*    Lab Results  Component Value Date   HGBA1C 6.0 (H) 02/06/2020   ------------------------------------------------------------------------------------------------------------------ No results for input(s): TSH, T4TOTAL, T3FREE, THYROIDAB in the last 72 hours.  Invalid input(s): FREET3  Cardiac Enzymes No results for input(s): CKMB, TROPONINI, MYOGLOBIN in the last 168 hours.  Invalid input(s): CK ------------------------------------------------------------------------------------------------------------------    Component Value Date/Time   BNP 169.5 (H) 04/25/2020 0243    Micro Results Recent Results (from the past 240 hour(s))  Blood Culture (routine x 2)     Status: None (Preliminary result)   Collection Time: 04/24/20  2:35 PM   Specimen: BLOOD  LEFT ARM  Result Value Ref Range Status   Specimen Description BLOOD LEFT ARM  Final   Special Requests  Final    BOTTLES DRAWN AEROBIC AND ANAEROBIC Blood Culture results may not be optimal due to an excessive volume of blood received in culture bottles   Culture   Final    NO GROWTH < 24 HOURS Performed at [facility] [address], [place], [place] [address]    Report Status PENDING  Incomplete  Blood Culture (routine x 2)     Status: None (Preliminary result)   Collection Time: 04/24/20 11:34 PM   Specimen: BLOOD  Result Value Ref Range Status   Specimen Description BLOOD LEFT ARM  Final   Special Requests   Final    BOTTLES DRAWN AEROBIC AND ANAEROBIC Blood Culture adequate volume   Culture   Final    NO GROWTH < 12 HOURS Performed at [facility], [place] [address], [place], [place] [address]    Report Status PENDING  Incomplete    Radiology Reports DG Chest Portable 1 View  Result Date: 04/24/2020 CLINICAL DATA:  Shortness of breath on exertion. Hypoxia. COVID-19 positive test on 04/14/2020. EXAM: PORTABLE CHEST 1 VIEW COMPARISON:  Chest CT dated 04/07/2019. FINDINGS: Normal sized heart. Mild diffuse prominence of the interstitial markings without Kerley lines. No pleural fluid. Diffuse osteopenia. 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 Electronically Signed   By: Claudie Revering M.D.   On: 04/24/2020 12:07

## 2020-04-25 NOTE — ED Notes (Signed)
Pt up at bedside eating breakfast

## 2020-04-26 DIAGNOSIS — U071 COVID-19: Secondary | ICD-10-CM | POA: Diagnosis not present

## 2020-04-26 DIAGNOSIS — J9601 Acute respiratory failure with hypoxia: Secondary | ICD-10-CM | POA: Diagnosis not present

## 2020-04-26 LAB — CBC WITH DIFFERENTIAL/PLATELET
Abs Immature Granulocytes: 0.08 10*3/uL — ABNORMAL HIGH (ref 0.00–0.07)
Basophils Absolute: 0 10*3/uL (ref 0.0–0.1)
Basophils Relative: 0 %
Eosinophils Absolute: 0 10*3/uL (ref 0.0–0.5)
Eosinophils Relative: 0 %
HCT: 36.9 % — ABNORMAL LOW (ref 39.0–52.0)
Hemoglobin: 12.7 g/dL — ABNORMAL LOW (ref 13.0–17.0)
Immature Granulocytes: 1 %
Lymphocytes Relative: 6 %
Lymphs Abs: 0.9 10*3/uL (ref 0.7–4.0)
MCH: 31.7 pg (ref 26.0–34.0)
MCHC: 34.4 g/dL (ref 30.0–36.0)
MCV: 92 fL (ref 80.0–100.0)
Monocytes Absolute: 0.6 10*3/uL (ref 0.1–1.0)
Monocytes Relative: 4 %
Neutro Abs: 13.5 10*3/uL — ABNORMAL HIGH (ref 1.7–7.7)
Neutrophils Relative %: 89 %
Platelets: 236 10*3/uL (ref 150–400)
RBC: 4.01 MIL/uL — ABNORMAL LOW (ref 4.22–5.81)
RDW: 13.2 % (ref 11.5–15.5)
WBC: 15 10*3/uL — ABNORMAL HIGH (ref 4.0–10.5)
nRBC: 0 % (ref 0.0–0.2)

## 2020-04-26 LAB — COMPREHENSIVE METABOLIC PANEL
ALT: 43 U/L (ref 0–44)
AST: 26 U/L (ref 15–41)
Albumin: 2.6 g/dL — ABNORMAL LOW (ref 3.5–5.0)
Alkaline Phosphatase: 54 U/L (ref 38–126)
Anion gap: 11 (ref 5–15)
BUN: 22 mg/dL (ref 8–23)
CO2: 24 mmol/L (ref 22–32)
Calcium: 8.9 mg/dL (ref 8.9–10.3)
Chloride: 106 mmol/L (ref 98–111)
Creatinine, Ser: 0.75 mg/dL (ref 0.61–1.24)
GFR, Estimated: >60 mL/min (ref >60)
Glucose, Bld: 158 mg/dL — ABNORMAL HIGH (ref 70–99)
Potassium: 4.1 mmol/L (ref 3.5–5.1)
Sodium: 141 mmol/L (ref 135–145)
Total Bilirubin: 0.7 mg/dL (ref 0.3–1.2)
Total Protein: 5.8 g/dL — ABNORMAL LOW (ref 6.5–8.1)

## 2020-04-26 LAB — D-DIMER, QUANTITATIVE: D-Dimer, Quant: <0.27 ug/mL-FEU (ref 0.00–0.50)

## 2020-04-26 LAB — GLUCOSE, CAPILLARY
Glucose-Capillary: 161 mg/dL — ABNORMAL HIGH (ref 70–99)
Glucose-Capillary: 193 mg/dL — ABNORMAL HIGH (ref 70–99)
Glucose-Capillary: 148 mg/dL — ABNORMAL HIGH (ref 70–99)
Glucose-Capillary: 178 mg/dL — ABNORMAL HIGH (ref 70–99)

## 2020-04-26 LAB — PROCALCITONIN: Procalcitonin: <0.1 ng/mL

## 2020-04-26 LAB — C-REACTIVE PROTEIN: CRP: 0.9 mg/dL (ref <1.0)

## 2020-04-26 LAB — MAGNESIUM: Magnesium: 1.9 mg/dL (ref 1.7–2.4)

## 2020-04-26 LAB — BRAIN NATRIURETIC PEPTIDE: B Natriuretic Peptide: 81.8 pg/mL (ref 0.0–100.0)

## 2020-04-26 NOTE — Evaluation (Signed)
Occupational Therapy Evaluation Patient Details Name: Martin Navarro MRN: [id number] DOB: Jan 13, 1951 Today's Date: 04/26/2020    History of Present Illness Pt is a 70 y/o male with PMHx including AKI, asthma, COPD, emphysema who presented with worsening COVID symptoms.  In the ER was diagnosed with acute hypoxic respiratory failure due to COVID-19 pneumonia and admitted to the hospital.   Clinical Impression   This 70 y/o male presents with the above. PTA pt reports being very independent with ADL, iADL and functional mobility, living with his significant other (girlfriend). Pt very pleasant and willing to participate in therapy session today. Pt completing ADL tasks and mobilizing multiple laps in room without AD, overall at supervision to mod independent level throughout. Pt on RA upon arrival to room with SpO2 92%, with mobility on RA SpO2 86%, requiring 3L O2 to maintain >/=88%. Initiated education of activity progression and energy conservation techniques after return home as pt is eager to return to his PLOF, pt verbalizing understanding throughout. He will benefit from continued acute OT services to maximize his overall safety and independence with ADL and mobility. Do not anticipate he will require follow up OT services after discharge.     Follow Up Recommendations  No OT follow up;Supervision - Intermittent    Equipment Recommendations  None recommended by OT           Precautions / Restrictions Precautions Precautions: Other (comment) Precaution Comments: monitor O2 sats Restrictions Weight Bearing Restrictions: No      Mobility Bed Mobility Overal bed mobility: Modified Independent             General bed mobility comments: HOB elevated    Transfers Overall transfer level: Modified independent               General transfer comment: assist only for lines    Balance Overall balance assessment: Needs assistance Sitting-balance support: Feet  supported Sitting balance-Leahy Scale: Good     Standing balance support: Single extremity supported;No upper extremity supported;During functional activity Standing balance-Leahy Scale: Good                             ADL either performed or assessed with clinical judgement   ADL Overall ADL's : Needs assistance/impaired     Grooming: Supervision/safety;Standing                               Functional mobility during ADLs: Supervision/safety General ADL Comments: today pt demonstrating ADL tasks and completing multiple laps in room without AD, completing tasks at supervision to mod independent level throughout. educated pt in energy conservation strategies and activity progression after return home as pt is eager to perform at his PLOF which was very independent                         Pertinent Vitals/Pain Pain Assessment: No/denies pain     Hand Dominance     Extremity/Trunk Assessment Upper Extremity Assessment Upper Extremity Assessment: Overall WFL for tasks assessed   Lower Extremity Assessment Lower Extremity Assessment: Defer to PT evaluation   Cervical / Trunk Assessment Cervical / Trunk Assessment: Normal   Communication Communication Communication: HOH   Cognition Arousal/Alertness: Awake/alert Behavior During Therapy: WFL for tasks assessed/performed Overall Cognitive Status: Within Functional Limits for tasks assessed  General Comments       Exercises Exercises: Other exercises Other Exercises Other Exercises: pt with flutter valve and IS next to him upon entry reports has completed breathing exercises throughout the AM, encouraged continued use   Shoulder Instructions      Home Living Family/patient expects to be discharged to:: Private residence Living Arrangements: Spouse/significant other (girlfriend) Available Help at Discharge: Family;Available  PRN/intermittently Type of Home: House Home Access: Level entry     Home Layout: Two level;Able to live on main level with bedroom/bathroom (pt and gf stay in basement (remodeled)) Alternate Level Stairs-Number of Steps: 7-8   Bathroom Shower/Tub: Occupational psychologist: Standard     Home Equipment: None   Additional Comments: girlfriend works also      Prior Functioning/Environment Level of Independence: Independent        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        OT Problem List: Decreased activity tolerance;Cardiopulmonary status limiting activity;Decreased knowledge of use of DME or AE      OT Treatment/Interventions: Self-care/ADL training;Therapeutic exercise;Energy conservation;DME and/or AE instruction;Therapeutic activities;Patient/family education;Balance training    OT Goals(Current goals can be found in the care plan section) Acute Rehab OT Goals Patient Stated Goal: home, back to independence OT Goal Formulation: With patient Time For Goal Achievement: 05/10/20 Potential to Achieve Goals: Good  OT Frequency: Min 2X/week   Barriers to D/C:            Co-evaluation              AM-PAC OT "6 Clicks" Daily Activity     Outcome Measure Help from another person eating meals?: None Help from another person taking care of personal grooming?: A Little Help from another person toileting, which includes using toliet, bedpan, or urinal?: A Little Help from another person bathing (including washing, rinsing, drying)?: A Little Help from another person to put on and taking off regular upper body clothing?: None Help from another person to put on and taking off regular lower body clothing?: A Little 6 Click Score: 20   End of Session Equipment Utilized During Treatment: Oxygen Nurse Communication: Mobility status  Activity Tolerance: Patient tolerated treatment well Patient left: in bed;with call bell/phone within  reach  OT Visit Diagnosis: Other (comment) (decreased cardiopulmonary status, decreased activity tolerance)                Time: 6333-5456 OT Time Calculation (min): 36 min Charges:  OT General Charges $OT Visit: 1 Visit OT Evaluation $OT Eval Moderate Complexity: 1 Mod OT Treatments $Self Care/Home Management : 8-22 mins  Lou Cal, OT Acute Rehabilitation Services Pager [phone] Office [phone]   Raymondo Band 04/26/2020, 9:39 AM

## 2020-04-26 NOTE — Progress Notes (Signed)
SATURATION QUALIFICATIONS: (This note is used to comply with regulatory documentation for home oxygen)  Patient Saturations on Room Air at Rest = 96%  Patient Saturations on Room Air while Ambulating = 82%  Patient Saturations on 3 Liters of oxygen while Ambulating = 92%  Please briefly explain why patient needs home oxygen: Patient's oxygen saturations dropped to 82% ambulating without oxygen.

## 2020-04-26 NOTE — Plan of Care (Signed)
Patient is currently resting in bed. OOB ambulating to BR. VSS. Currently on 3L [place]. No complaints overnight. Call bell within reach.   Problem: Education: Goal: Knowledge of risk factors and measures for prevention of condition will improve Outcome: Progressing   Problem: Coping: Goal: Psychosocial and spiritual needs will be supported Outcome: Progressing   Problem: Respiratory: Goal: Will maintain a patent airway Outcome: Progressing Goal: Complications related to the disease process, condition or treatment will be avoided or minimized Outcome: Progressing

## 2020-04-26 NOTE — Progress Notes (Addendum)
PROGRESS NOTE                                                                                                                                                                                                             Patient Demographics:    Martin Navarro, is a 70 y.o. male, DOB - 1950/09/15, [id number]  Outpatient Primary MD for the patient is [name], MD    LOS - 2  Admit date - 04/24/2020    Chief Complaint  Patient presents with   Covid Positive   Shortness of Breath       Brief Narrative (HPI from H&P) -  Martin Navarro is a 70 y.o. male with medical history significant of psoriasis; HLD; COPD presenting with worsening COVID symptoms.  He developed symptoms on 1/18, after his girlfriend tested positive for COVID, used outpatient prednisone and azithromycin for a few days without much help.  In the ER was diagnosed with acute hypoxic respiratory failure due to COVID-19 pneumonia and admitted to the hospital.   Subjective:   Patient in bed, appears comfortable, denies any headache, no fever, no chest pain or pressure, improved shortness of breath , no abdominal pain. No focal weakness.   Assessment  & Plan :     1. Acute Hypoxic Resp. Failure due to Acute Covid 19 Viral Pneumonitis patient who has taken 2 shots of mRNA vaccine so far - he had moderate parenchymal lung injury, currently requiring 2 L of oxygen at rest, on steroids and Remdesivir which will be continued.  If there is a significant decline he has consented for Actemra use.  Encouraged the patient to sit up in chair in the daytime use I-S and flutter valve for pulmonary toiletry and then prone in bed when at night.  Will advance activity and titrate down oxygen as possible.   Recent Labs  Lab 04/24/20 1130 04/24/20 1435 04/25/20 0243 04/26/20 0238  WBC 12.0*  --  7.5 15.0*  HGB 13.5  --  13.0 12.7*  HCT 39.3  --  38.6* 36.9*  PLT  205  --  202 236  CRP  --  2.1* 2.5* 0.9  BNP  --   --  169.5* 81.8  DDIMER  --  0.39 0.31 <0.27  PROCALCITON  --  <0.10  --  <0.10  AST  --  46* 33 26  ALT  --  42 40 43  ALKPHOS  --  62 59 54  BILITOT  --  0.7 0.7 0.7  ALBUMIN  --  3.2* 2.8* 2.6*  LATICACIDVEN  --  1.4  --   --     2.  Severe hearing loss.  Use hearing aids.  3.  Essential hypertension.  On ARB.  4.  Dyslipidemia.  On statin.  5.  GERD.  PPI.  6.  History of COPD.  Supportive care.  He is vaping, counseled to quit.   7. 1/2 blood cultures growing strep, ?  Contamination, procalcitonin throughout has been unremarkable.  Does have some leukocytosis but this could be due to steroid use.  No fever or productive cough, no other focus of bacterial infection we will continue to monitor.  Currently on 2 g of Rocephin.  Will check echocardiogram as well.       Condition - Extremely Guarded  Family Communication  :  Daughter [phone] on 04/25/20  Code Status : Full  Consults  :  None  Procedures  :    PUD Prophylaxis : PPI  Disposition Plan  :    Status is: Inpatient  Remains inpatient appropriate because:IV treatments appropriate due to intensity of illness or inability to take PO   Dispo: The patient is from: Home              Anticipated d/c is to: Home              Anticipated d/c date is: 2 days              Patient currently is not medically stable to d/c.   Difficult to place patient No   DVT Prophylaxis  :  Lovenox    Lab Results  Component Value Date   PLT 236 04/26/2020    Diet :  Diet Order            Diet regular Room service appropriate? Yes; Fluid consistency: Thin  Diet effective now                  Inpatient Medications  Scheduled Meds:  vitamin C  500 mg Oral Daily   aspirin EC  81 mg Oral Daily   docusate sodium  100 mg Oral BID   enoxaparin (LOVENOX) injection  40 mg Subcutaneous Q24H   insulin aspart  0-15 Units Subcutaneous TID WC   losartan  50 mg  Oral Daily   methylPREDNISolone (SOLU-MEDROL) injection  40 mg Intravenous BID   pantoprazole  40 mg Oral Daily   rosuvastatin  10 mg Oral Daily   sodium chloride flush  3 mL Intravenous Q12H   zinc sulfate  220 mg Oral Daily   Continuous Infusions:  cefTRIAXone (ROCEPHIN)  IV 2 g (04/25/20 2211)   remdesivir 100 mg in NS 100 mL 100 mg (04/26/20 0854)   PRN Meds:.acetaminophen, albuterol, bisacodyl, chlorpheniramine-HYDROcodone, guaiFENesin-dextromethorphan, [DISCONTINUED] ondansetron **OR** ondansetron (ZOFRAN) IV, polyethylene glycol, sodium phosphate  Antibiotics  :    Anti-infectives (From admission, onward)   Start     Dose/Rate Route Frequency Ordered Stop   04/25/20 2200  cefTRIAXone (ROCEPHIN) 2 g in sodium chloride 0.9 % 100 mL IVPB        2 g 200 mL/hr over 30 Minutes Intravenous Every 24 hours 04/25/20 2104     04/25/20 1000  remdesivir 100 mg in sodium chloride 0.9 % 100 mL IVPB       "  Followed by" Linked Group Details   100 mg 200 mL/hr over 30 Minutes Intravenous Daily 04/24/20 1332 04/29/20 0959   04/24/20 1345  remdesivir 200 mg in sodium chloride 0.9% 250 mL IVPB       "Followed by" Linked Group Details   200 mg 580 mL/hr over 30 Minutes Intravenous Once 04/24/20 1332 04/24/20 1828       Time Spent in minutes  30   Lala Lund M.D on 04/26/2020 at 11:06 AM  To page go to [email or web address]   [facility] -  Office  [phone]   See all Orders from today for further details    Objective:   Vitals:   04/26/20 0105 04/26/20 0427 04/26/20 0748 04/26/20 0830  BP:  127/75 130/85   Pulse:  (!) 56 71   Resp:  14 18   Temp:  97.8 F (36.6 C) 97.6 F (36.4 C)   TempSrc:  Axillary Oral   SpO2: 97% 100% 98% 96%  Weight:      Height:        Wt Readings from Last 3 Encounters:  04/24/20 80 kg  02/06/20 80.5 kg  12/24/19 79.5 kg     Intake/Output Summary (Last 24 hours) at 04/26/2020 1106 Last data filed at 04/26/2020 0600 Gross per 24  hour  Intake 106.56 ml  Output 750 ml  Net -643.44 ml     Physical Exam  Awake Alert, No new F.N deficits, Normal affect [place].AT,PERRAL Supple Neck,No JVD, No cervical lymphadenopathy appriciated.  Symmetrical Chest wall movement, Good air movement bilaterally, CTAB RRR,No Gallops, Rubs or new Murmurs, No Parasternal Heave +ve B.Sounds, Abd Soft, No tenderness, No organomegaly appriciated, No rebound - guarding or rigidity. No Cyanosis, Clubbing or edema, No new Rash or bruise     Data Review:    CBC Recent Labs  Lab 04/24/20 1130 04/25/20 0243 04/26/20 0238  WBC 12.0* 7.5 15.0*  HGB 13.5 13.0 12.7*  HCT 39.3 38.6* 36.9*  PLT 205 202 236  MCV 91.8 93.7 92.0  MCH 31.5 31.6 31.7  MCHC 34.4 33.7 34.4  RDW 13.2 13.1 13.2  LYMPHSABS  --  0.8 0.9  MONOABS  --  0.5 0.6  EOSABS  --  0.0 0.0  BASOSABS  --  0.0 0.0    Recent Labs  Lab 04/24/20 1130 04/24/20 1435 04/25/20 0243 04/26/20 0238  NA 141 142 141 141  K 3.3* 4.0 4.4 4.1  CL 107 104 105 106  CO2 25 25 24 24   GLUCOSE 113* 141* 180* 158*  BUN 13 14 14 22   CREATININE 0.75 0.80 0.66 0.75  CALCIUM 8.8* 8.9 8.8* 8.9  AST  --  46* 33 26  ALT  --  42 40 43  ALKPHOS  --  62 59 54  BILITOT  --  0.7 0.7 0.7  ALBUMIN  --  3.2* 2.8* 2.6*  MG  --   --  1.8 1.9  CRP  --  2.1* 2.5* 0.9  DDIMER  --  0.39 0.31 <0.27  PROCALCITON  --  <0.10  --  <0.10  LATICACIDVEN  --  1.4  --   --   BNP  --   --  169.5* 81.8    ------------------------------------------------------------------------------------------------------------------ Recent Labs    04/24/20 1435  TRIG 234*    Lab Results  Component Value Date   HGBA1C 6.0 (H) 02/06/2020   ------------------------------------------------------------------------------------------------------------------ No results for input(s): TSH, T4TOTAL, T3FREE, THYROIDAB in the last 72 hours.  Invalid input(s):  FREET3  Cardiac Enzymes No results for input(s): CKMB, TROPONINI,  MYOGLOBIN in the last 168 hours.  Invalid input(s): CK ------------------------------------------------------------------------------------------------------------------    Component Value Date/Time   BNP 81.8 04/26/2020 0238    Micro Results Recent Results (from the past 240 hour(s))  Blood Culture (routine x 2)     Status: None (Preliminary result)   Collection Time: 04/24/20  2:35 PM   Specimen: BLOOD LEFT ARM  Result Value Ref Range Status   Specimen Description BLOOD LEFT ARM  Final   Special Requests   Final    BOTTLES DRAWN AEROBIC AND ANAEROBIC Blood Culture results may not be optimal due to an excessive volume of blood received in culture bottles   Culture   Final    NO GROWTH < 24 HOURS Performed at [facility], [address] [place], [place] [address]    Report Status PENDING  Incomplete  Blood Culture (routine x 2)     Status: None (Preliminary result)   Collection Time: 04/24/20 11:34 PM   Specimen: BLOOD  Result Value Ref Range Status   Specimen Description BLOOD LEFT ARM  Final   Special Requests   Final    BOTTLES DRAWN AEROBIC AND ANAEROBIC Blood Culture adequate volume   Culture  Setup Time   Final    GRAM POSITIVE COCCI IN BOTH AEROBIC AND ANAEROBIC BOTTLES Organism ID to follow CRITICAL RESULT CALLED TO, READ BACK BY AND VERIFIED WITH: PHARMD J CARNEY 04/25/20 AT 2055 SK  Performed at [facility], [address] [place], [place] [address]    Culture GRAM POSITIVE COCCI  Final   Report Status PENDING  Incomplete  Blood Culture ID Panel (Reflexed)     Status: Abnormal   Collection Time: 04/24/20 11:34 PM  Result Value Ref Range Status   Enterococcus faecalis NOT DETECTED NOT DETECTED Final   Enterococcus Faecium NOT DETECTED NOT DETECTED Final   Listeria monocytogenes NOT DETECTED NOT DETECTED Final   Staphylococcus species NOT DETECTED NOT DETECTED Final   Staphylococcus aureus (BCID) NOT DETECTED NOT DETECTED Final   Staphylococcus  epidermidis NOT DETECTED NOT DETECTED Final   Staphylococcus lugdunensis NOT DETECTED NOT DETECTED Final   Streptococcus species DETECTED (A) NOT DETECTED Final    Comment: Not Enterococcus species, Streptococcus agalactiae, Streptococcus pyogenes, or Streptococcus pneumoniae. CRITICAL RESULT CALLED TO, READ BACK BY AND VERIFIED WITH: PHARMD J CARNEY 04/25/20 AT 2055 SK     Streptococcus agalactiae NOT DETECTED NOT DETECTED Final   Streptococcus pneumoniae NOT DETECTED NOT DETECTED Final   Streptococcus pyogenes NOT DETECTED NOT DETECTED Final   A.calcoaceticus-baumannii NOT DETECTED NOT DETECTED Final   Bacteroides fragilis NOT DETECTED NOT DETECTED Final   Enterobacterales NOT DETECTED NOT DETECTED Final   Enterobacter cloacae complex NOT DETECTED NOT DETECTED Final   Escherichia coli NOT DETECTED NOT DETECTED Final   Klebsiella aerogenes NOT DETECTED NOT DETECTED Final   Klebsiella oxytoca NOT DETECTED NOT DETECTED Final   Klebsiella pneumoniae NOT DETECTED NOT DETECTED Final   Proteus species NOT DETECTED NOT DETECTED Final   Salmonella species NOT DETECTED NOT DETECTED Final   Serratia marcescens NOT DETECTED NOT DETECTED Final   Haemophilus influenzae NOT DETECTED NOT DETECTED Final   Neisseria meningitidis NOT DETECTED NOT DETECTED Final   Pseudomonas aeruginosa NOT DETECTED NOT DETECTED Final   Stenotrophomonas maltophilia NOT DETECTED NOT DETECTED Final   Candida albicans NOT DETECTED NOT DETECTED Final   Candida auris NOT DETECTED NOT DETECTED Final  Candida glabrata NOT DETECTED NOT DETECTED Final   Candida krusei NOT DETECTED NOT DETECTED Final   Candida parapsilosis NOT DETECTED NOT DETECTED Final   Candida tropicalis NOT DETECTED NOT DETECTED Final   Cryptococcus neoformans/gattii NOT DETECTED NOT DETECTED Final    Comment: Performed at [facility], [place] [address] [place], [place] [address]    Radiology Reports DG Chest Portable 1 View  Result Date:  04/24/2020 CLINICAL DATA:  Shortness of breath on exertion. Hypoxia. COVID-19 positive test on 04/14/2020. EXAM: PORTABLE CHEST 1 VIEW COMPARISON:  Chest CT dated 04/07/2019. FINDINGS: Normal sized heart. Mild diffuse prominence of the interstitial markings without Kerley lines. No pleural fluid. Diffuse osteopenia. 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 Electronically Signed   By: Claudie Revering M.D.   On: 04/24/2020 12:07

## 2020-04-26 NOTE — Evaluation (Signed)
Physical Therapy Evaluation Patient Details Name: Martin Navarro MRN: [id number] DOB: 11/19/1950 Today's Date: 04/26/2020   History of Present Illness  Pt is a 70 y/o male with PMHx including AKI, asthma, COPD, emphysema who presented with worsening COVID symptoms.  In the ER was diagnosed with acute hypoxic respiratory failure due to COVID-19 pneumonia and admitted to the hospital.  Clinical Impression  Pt admitted with above diagnosis.  He has been transferring and ambulating in room independently.  Pt ambulated with PT at least 600' without AD at normal to fast speed and gait pattern.  Able to demonstrate dynamic gait activities safely.  Pt was on RA at rest with sats 95% and 2 LPM for ambulation with sats 90% (even with fast gait speed and talking).  Pt does not have further skilled PT needs.  He is very motivated - recommend hallway ambulation with nursing multiple x day.  Pt with very low O2 need with activity at this time.     Follow Up Recommendations No PT follow up    Equipment Recommendations  None recommended by PT    Recommendations for Other Services       Precautions / Restrictions Precautions Precautions: Other (comment) Precaution Comments: monitor O2 sats      Mobility  Bed Mobility Overal bed mobility: Independent             General bed mobility comments: Pt has been ambulating in room independently    Transfers Overall transfer level: Needs assistance   Transfers: Sit to/from Stand Sit to Stand: Supervision         General transfer comment: assist only for lines  Ambulation/Gait Ambulation/Gait assistance: Supervision Gait Distance (Feet): 600 Feet Assistive device: None Gait Pattern/deviations: Step-through pattern Gait velocity: normal to fast   General Gait Details: Pt with normal gait pattern at normal to fast speed.  He was able to navigate around objects, step over objects, stop, turn, and look up/down/L/R without LOB.  See general  comments for vitals  Stairs            Wheelchair Mobility    Modified Rankin (Stroke Patients Only)       Balance Overall balance assessment: Independent   Sitting balance-Leahy Scale: Normal       Standing balance-Leahy Scale: Good                               Pertinent Vitals/Pain Pain Assessment: No/denies pain    Home Living Family/patient expects to be discharged to:: Private residence Living Arrangements: Spouse/significant other Available Help at Discharge: Family;Available PRN/intermittently Type of Home: House Home Access: Level entry     Home Layout: Two level;Able to live on main level with bedroom/bathroom Home Equipment: None Additional Comments: girlfriend works also    Prior Function Level of Independence: Independent         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     Hand Dominance        Extremity/Trunk Assessment   Upper Extremity Assessment Upper Extremity Assessment: Overall WFL for tasks assessed    Lower Extremity Assessment Lower Extremity Assessment: Overall WFL for tasks assessed    Cervical / Trunk Assessment Cervical / Trunk Assessment: Normal  Communication   Communication: HOH  Cognition Arousal/Alertness: Awake/alert Behavior During Therapy: WFL for tasks assessed/performed Overall Cognitive Status: Within Functional Limits for tasks assessed  General Comments: Some repetition required due to Willis comments (skin integrity, edema, etc.): Pt on RA at rest with sats 95%.  He ambulated earlier with nursing and OT with sats dropping into 80's on RA, so did not attempt RA again.  Started with 3 L O2 for ambulation as that is what he required earlier - sats maintained 94% with good speed and talking.  Decreased to 2 L O2 and sats maintaion 90% again with good speed and talking.  Pt with no DOE and normal  RR.  Educated pt on good progress with PT and no further PT needs.  Recommend ambulation with nursing.  Also, notified daughter via phone that pt did well w PT and only needed 2 L O2.  She expressed that he really wants to be off oxygen at d/c.    Exercises Other Exercises Other Exercises: Flutter x 5 and IS x 5 up to 1800 mL - reports he is using frequently, demonstrated correct use   Assessment/Plan    PT Assessment Patent does not need any further PT services  PT Problem List         PT Treatment Interventions      PT Goals (Current goals can be found in the Care Plan section)  Acute Rehab PT Goals Patient Stated Goal: home, back to independence, get off O2 PT Goal Formulation: All assessment and education complete, DC therapy Potential to Achieve Goals: Good    Frequency     Barriers to discharge        Co-evaluation               AM-PAC PT "6 Clicks" Mobility  Outcome Measure Help needed turning from your back to your side while in a flat bed without using bedrails?: None Help needed moving from lying on your back to sitting on the side of a flat bed without using bedrails?: None Help needed moving to and from a bed to a chair (including a wheelchair)?: None Help needed standing up from a chair using your arms (e.g., wheelchair or bedside chair)?: None Help needed to walk in hospital room?: None Help needed climbing 3-5 steps with a railing? : A Little 6 Click Score: 23    End of Session Equipment Utilized During Treatment: Oxygen Activity Tolerance: Patient tolerated treatment well Patient left: in bed;with call bell/phone within reach Nurse Communication: Mobility status      Time: 2774-1287 PT Time Calculation (min) (ACUTE ONLY): 25 min   Charges:   PT Evaluation $PT Eval Low Complexity: 1 Low PT Treatments $Gait Training: 8-22 mins        Abran Richard, PT Acute Rehab Services Pager [phone] [facility] [phone]    Karlton Lemon 04/26/2020, 2:02 PM

## 2020-04-26 NOTE — Progress Notes (Signed)
SATURATION QUALIFICATIONS: (This note is used to comply with regulatory documentation for home oxygen)  Patient Saturations on Room Air at Rest = 92%  Patient Saturations on Room Air while Ambulating = 86%  Patient Saturations on 3 Liters of oxygen while Ambulating = 88%  Please briefly explain why patient needs home oxygen: Pt requiring supplemental O2 to maintain adequate O2 saturations during mobility/functional tasks   Full progress note to follow.  Lou Cal, OT Acute Rehabilitation Services Pager [phone] Office [phone]

## 2020-04-27 DIAGNOSIS — J9601 Acute respiratory failure with hypoxia: Secondary | ICD-10-CM | POA: Diagnosis not present

## 2020-04-27 DIAGNOSIS — U071 COVID-19: Secondary | ICD-10-CM | POA: Diagnosis not present

## 2020-04-27 LAB — COMPREHENSIVE METABOLIC PANEL
ALT: 39 U/L (ref 0–44)
AST: 20 U/L (ref 15–41)
Albumin: 2.5 g/dL — ABNORMAL LOW (ref 3.5–5.0)
Alkaline Phosphatase: 50 U/L (ref 38–126)
Anion gap: 13 (ref 5–15)
BUN: 29 mg/dL — ABNORMAL HIGH (ref 8–23)
CO2: 24 mmol/L (ref 22–32)
Calcium: 8.6 mg/dL — ABNORMAL LOW (ref 8.9–10.3)
Chloride: 105 mmol/L (ref 98–111)
Creatinine, Ser: 0.82 mg/dL (ref 0.61–1.24)
GFR, Estimated: >60 mL/min (ref >60)
Glucose, Bld: 126 mg/dL — ABNORMAL HIGH (ref 70–99)
Potassium: 4 mmol/L (ref 3.5–5.1)
Sodium: 142 mmol/L (ref 135–145)
Total Bilirubin: 0.7 mg/dL (ref 0.3–1.2)
Total Protein: 5.7 g/dL — ABNORMAL LOW (ref 6.5–8.1)

## 2020-04-27 LAB — CBC WITH DIFFERENTIAL/PLATELET
Abs Immature Granulocytes: 0.08 10*3/uL — ABNORMAL HIGH (ref 0.00–0.07)
Basophils Absolute: 0 10*3/uL (ref 0.0–0.1)
Basophils Relative: 0 %
Eosinophils Absolute: 0 10*3/uL (ref 0.0–0.5)
Eosinophils Relative: 0 %
HCT: 39.8 % (ref 39.0–52.0)
Hemoglobin: 12.9 g/dL — ABNORMAL LOW (ref 13.0–17.0)
Immature Granulocytes: 1 %
Lymphocytes Relative: 6 %
Lymphs Abs: 1 10*3/uL (ref 0.7–4.0)
MCH: 30.2 pg (ref 26.0–34.0)
MCHC: 32.4 g/dL (ref 30.0–36.0)
MCV: 93.2 fL (ref 80.0–100.0)
Monocytes Absolute: 0.9 10*3/uL (ref 0.1–1.0)
Monocytes Relative: 5 %
Neutro Abs: 14.4 10*3/uL — ABNORMAL HIGH (ref 1.7–7.7)
Neutrophils Relative %: 88 %
Platelets: 290 10*3/uL (ref 150–400)
RBC: 4.27 MIL/uL (ref 4.22–5.81)
RDW: 13.2 % (ref 11.5–15.5)
WBC: 16.4 10*3/uL — ABNORMAL HIGH (ref 4.0–10.5)
nRBC: 0 % (ref 0.0–0.2)

## 2020-04-27 LAB — CULTURE, BLOOD (ROUTINE X 2): Special Requests: ADEQUATE

## 2020-04-27 LAB — MAGNESIUM: Magnesium: 2.1 mg/dL (ref 1.7–2.4)

## 2020-04-27 LAB — D-DIMER, QUANTITATIVE: D-Dimer, Quant: 0.38 ug/mL-FEU (ref 0.00–0.50)

## 2020-04-27 LAB — GLUCOSE, CAPILLARY
Glucose-Capillary: 123 mg/dL — ABNORMAL HIGH (ref 70–99)
Glucose-Capillary: 195 mg/dL — ABNORMAL HIGH (ref 70–99)
Glucose-Capillary: 177 mg/dL — ABNORMAL HIGH (ref 70–99)
Glucose-Capillary: 110 mg/dL — ABNORMAL HIGH (ref 70–99)

## 2020-04-27 LAB — C-REACTIVE PROTEIN: CRP: 0.7 mg/dL (ref <1.0)

## 2020-04-27 LAB — BRAIN NATRIURETIC PEPTIDE: B Natriuretic Peptide: 65.6 pg/mL (ref 0.0–100.0)

## 2020-04-27 LAB — PROCALCITONIN: Procalcitonin: <0.1 ng/mL

## 2020-04-27 MED ORDER — SALINE SPRAY 0.65 % NA SOLN
1.0000 | NASAL | Status: DC | PRN
  Filled 2020-04-27: qty 44

## 2020-04-27 NOTE — Progress Notes (Signed)
Occupational Therapy Treatment Patient Details Name: Martin Navarro MRN: [id number] DOB: 1951/02/18 Today's Date: 04/27/2020    History of present illness Pt is a 70 y/o male with PMHx including AKI, asthma, COPD, emphysema who presented with worsening COVID symptoms.  In the ER was diagnosed with acute hypoxic respiratory failure due to COVID-19 pneumonia and admitted to the hospital.   OT comments  Pt progressing well towards OT goals. He completed room and hallway mobility at supervision level without AD. Pt performing mobility tasks on RA with SpO2 >/=86% throughout. Continued education/reinforcement provided on energy conservation strategies and activity pacing after return home with pt verbalizing understanding. Will continue per POC at this time.   Follow Up Recommendations  No OT follow up;Supervision - Intermittent    Equipment Recommendations  None recommended by OT          Precautions / Restrictions Precautions Precaution Comments: monitor O2 sats Restrictions Weight Bearing Restrictions: No       Mobility Bed Mobility Overal bed mobility: Independent                Transfers Overall transfer level: Modified independent                    Balance Overall balance assessment: No apparent balance deficits (not formally assessed)                                         ADL either performed or assessed with clinical judgement   ADL Overall ADL's : Needs assistance/impaired                                     Functional mobility during ADLs: Supervision/safety General ADL Comments: session focus on mobility in hallway, continued education provided on energy conservation and activity pacing               Cognition Arousal/Alertness: Awake/alert Behavior During Therapy: WFL for tasks assessed/performed Overall Cognitive Status: Within Functional Limits for tasks assessed                                  General Comments: Some repetition required due to [facility]        Exercises     Shoulder Instructions       General Comments      Pertinent Vitals/ Pain       Pain Assessment: No/denies pain  Home Living                                          Prior Functioning/Environment              Frequency  Min 2X/week        Progress Toward Goals  OT Goals(current goals can now be found in the care plan section)  Progress towards OT goals: Progressing toward goals  Acute Rehab OT Goals Patient Stated Goal: home, back to independence, get off O2 OT Goal Formulation: With patient Time For Goal Achievement: 05/10/20 Potential to Achieve Goals: Good ADL Goals Pt Will Perform Grooming: with modified independence;standing Pt Will Perform Lower Body Bathing: with modified independence;sit to/from stand  Pt Will Perform Lower Body Dressing: with modified independence;sit to/from stand Pt Will Transfer to Toilet: with modified independence;ambulating Pt Will Perform Toileting - Clothing Manipulation and hygiene: with modified independence;sit to/from stand Pt/caregiver will Perform Home Exercise Program: Increased strength;Both right and left upper extremity;With written HEP provided;With theraband;Independently Additional ADL Goal #1: Pt will independently implement energy conservation techniques during ADL/functional tasks .  Plan Discharge plan remains appropriate    Co-evaluation                 AM-PAC OT "6 Clicks" Daily Activity     Outcome Measure   Help from another person eating meals?: None Help from another person taking care of personal grooming?: A Little Help from another person toileting, which includes using toliet, bedpan, or urinal?: A Little Help from another person bathing (including washing, rinsing, drying)?: A Little Help from another person to put on and taking off regular upper body clothing?: None Help from another  person to put on and taking off regular lower body clothing?: A Little 6 Click Score: 20    End of Session    OT Visit Diagnosis: Other (comment) (decreased cardiopulmonary status, decreased activity tolerance)   Activity Tolerance Patient tolerated treatment well   Patient Left in bed;with call bell/phone within reach   Nurse Communication Mobility status        Time: 2376-2831 OT Time Calculation (min): 17 min  Charges: OT General Charges $OT Visit: 1 Visit OT Treatments $Therapeutic Activity: 8-22 mins  Lou Cal, OT Acute Rehabilitation Services Pager [phone] Office [place] 04/27/2020, 5:21 PM

## 2020-04-27 NOTE — Plan of Care (Signed)
VSS. Remains on room air. OOB ambulating to BR. Call bell within reach. No complaints overnight.   Problem: Education: Goal: Knowledge of risk factors and measures for prevention of condition will improve Outcome: Progressing   Problem: Coping: Goal: Psychosocial and spiritual needs will be supported Outcome: Progressing   Problem: Respiratory: Goal: Will maintain a patent airway Outcome: Progressing Goal: Complications related to the disease process, condition or treatment will be avoided or minimized Outcome: Progressing

## 2020-04-27 NOTE — Progress Notes (Signed)
PROGRESS NOTE                                                                                                                                                                                                             Patient Demographics:    Martin Navarro, is a 70 y.o. male, DOB - 08-Aug-1950, LOV:[id number]  Outpatient Primary MD for the patient is [name], MD    LOS - 3  Admit date - 04/24/2020    Chief Complaint  Patient presents with  . Covid Positive  . Shortness of Breath       Brief Narrative (HPI from H&P) -  Martin Navarro is a 70 y.o. male with medical history significant of psoriasis; HLD; COPD presenting with worsening COVID symptoms.  He developed symptoms on 1/18, after his girlfriend tested positive for COVID, used outpatient prednisone and azithromycin for a few days without much help.  In the ER was diagnosed with acute hypoxic respiratory failure due to COVID-19 pneumonia and admitted to the hospital.   Subjective:   Patient in bed, appears comfortable, denies any headache, no fever, no chest pain or pressure, no shortness of breath , no abdominal pain. No focal weakness.   Assessment  & Plan :     1. Acute Hypoxic Resp. Failure due to Acute Covid 19 Viral Pneumonitis patient who has taken 2 shots of mRNA vaccine so far - he had moderate parenchymal lung injury, currently requiring 2 L of oxygen at rest, on steroids and Remdesivir which will be continued.  If there is a significant decline he has consented for Actemra use.  Encouraged the patient to sit up in chair in the daytime use I-S and flutter valve for pulmonary toiletry and then prone in bed when at night.  Will advance activity and titrate down oxygen as possible.   Recent Labs  Lab 04/24/20 1130 04/24/20 1435 04/25/20 0243 04/26/20 0238 04/27/20 0108  WBC 12.0*  --  7.5 15.0* 16.4*  HGB 13.5  --  13.0 12.7* 12.9*  HCT 39.3  --   38.6* 36.9* 39.8  PLT 205  --  202 236 290  CRP  --  2.1* 2.5* 0.9 0.7  BNP  --   --  169.5* 81.8 65.6  DDIMER  --  0.39 0.31 <0.27 0.38  PROCALCITON  --  <  0.10  --  <0.10 <0.10  AST  --  46* 33 26 20  ALT  --  42 40 43 39  ALKPHOS  --  62 59 54 50  BILITOT  --  0.7 0.7 0.7 0.7  ALBUMIN  --  3.2* 2.8* 2.6* 2.5*  LATICACIDVEN  --  1.4  --   --   --     2.  Severe hearing loss.  Use hearing aids.  3.  Essential hypertension.  On ARB.  4.  Dyslipidemia.  On statin.  5.  GERD.  PPI.  6.  History of COPD.  Supportive care.  He is vaping, counseled to quit.   7. 1/2 blood cultures growing strep, ?  Contamination, procalcitonin throughout has been unremarkable.  Does have some leukocytosis but this could be due to steroid use.  No fever or productive cough, no other focus of bacterial infection we will continue to monitor.  Currently on 2 g of Rocephin.  Will repeat another set of cultures and check echocardiogram as well. DW ID Dr Johnnye Sima 04/27/20.       Condition - Extremely Guarded  Family Communication  :  Daughter [phone] on 04/25/20, 04/27/20  Code Status : Full  Consults  :  None  Procedures  :    PUD Prophylaxis : PPI  Disposition Plan  :    Status is: Inpatient  Remains inpatient appropriate because:IV treatments appropriate due to intensity of illness or inability to take PO   Dispo: The patient is from: Home              Anticipated d/c is to: Home              Anticipated d/c date is: 2 days              Patient currently is not medically stable to d/c.   Difficult to place patient No   DVT Prophylaxis  :  Lovenox    Lab Results  Component Value Date   PLT 290 04/27/2020    Diet :  Diet Order            Diet regular Room service appropriate? Yes; Fluid consistency: Thin  Diet effective now                  Inpatient Medications  Scheduled Meds: . vitamin C  500 mg Oral Daily  . aspirin EC  81 mg Oral Daily  . docusate sodium  100 mg  Oral BID  . enoxaparin (LOVENOX) injection  40 mg Subcutaneous Q24H  . insulin aspart  0-15 Units Subcutaneous TID WC  . losartan  50 mg Oral Daily  . methylPREDNISolone (SOLU-MEDROL) injection  40 mg Intravenous BID  . pantoprazole  40 mg Oral Daily  . rosuvastatin  10 mg Oral Daily  . sodium chloride flush  3 mL Intravenous Q12H  . zinc sulfate  220 mg Oral Daily   Continuous Infusions: . cefTRIAXone (ROCEPHIN)  IV Stopped (04/27/20 0806)  . remdesivir 100 mg in NS 100 mL 100 mg (04/27/20 0800)   PRN Meds:.acetaminophen, albuterol, bisacodyl, chlorpheniramine-HYDROcodone, guaiFENesin-dextromethorphan, [DISCONTINUED] ondansetron **OR** ondansetron (ZOFRAN) IV, polyethylene glycol, sodium phosphate  Antibiotics  :    Anti-infectives (From admission, onward)   Start     Dose/Rate Route Frequency Ordered Stop   04/25/20 2200  cefTRIAXone (ROCEPHIN) 2 g in sodium chloride 0.9 % 100 mL IVPB        2 g 200 mL/hr over  30 Minutes Intravenous Every 24 hours 04/25/20 2104     04/25/20 1000  remdesivir 100 mg in sodium chloride 0.9 % 100 mL IVPB       "Followed by" Linked Group Details   100 mg 200 mL/hr over 30 Minutes Intravenous Daily 04/24/20 1332 04/29/20 0959   04/24/20 1345  remdesivir 200 mg in sodium chloride 0.9% 250 mL IVPB       "Followed by" Linked Group Details   200 mg 580 mL/hr over 30 Minutes Intravenous Once 04/24/20 1332 04/24/20 1828       Time Spent in minutes  30   Lala Lund M.D on 04/27/2020 at 8:56 AM  To page go to [email or web address]   [facility] -  Office  [phone]   See all Orders from today for further details    Objective:   Vitals:   04/26/20 1213 04/26/20 1708 04/26/20 1959 04/27/20 0406  BP: 140/79 134/81 121/65 124/73  Pulse: 75 70 92 80  Resp: 17 19 (!) 21 18  Temp: 97.8 F (36.6 C) 97.7 F (36.5 C) 97.9 F (36.6 C) 98 F (36.7 C)  TempSrc: Oral Oral Oral Oral  SpO2: 93% 92% 92% 91%  Weight:      Height:        Wt  Readings from Last 3 Encounters:  04/24/20 80 kg  02/06/20 80.5 kg  12/24/19 79.5 kg    No intake or output data in the 24 hours ending 04/27/20 0856   Physical Exam  Awake Alert, No new F.N deficits, Normal affect Travis.AT,PERRAL Supple Neck,No JVD, No cervical lymphadenopathy appriciated.  Symmetrical Chest wall movement, Good air movement bilaterally, CTAB RRR,No Gallops, Rubs or new Murmurs, No Parasternal Heave +ve B.Sounds, Abd Soft, No tenderness, No organomegaly appriciated, No rebound - guarding or rigidity. No Cyanosis, Clubbing or edema, No new Rash or bruise    Data Review:    CBC Recent Labs  Lab 04/24/20 1130 04/25/20 0243 04/26/20 0238 04/27/20 0108  WBC 12.0* 7.5 15.0* 16.4*  HGB 13.5 13.0 12.7* 12.9*  HCT 39.3 38.6* 36.9* 39.8  PLT 205 202 236 290  MCV 91.8 93.7 92.0 93.2  MCH 31.5 31.6 31.7 30.2  MCHC 34.4 33.7 34.4 32.4  RDW 13.2 13.1 13.2 13.2  LYMPHSABS  --  0.8 0.9 1.0  MONOABS  --  0.5 0.6 0.9  EOSABS  --  0.0 0.0 0.0  BASOSABS  --  0.0 0.0 0.0    Recent Labs  Lab 04/24/20 1130 04/24/20 1435 04/25/20 0243 04/26/20 0238 04/27/20 0108  NA 141 142 141 141 142  K 3.3* 4.0 4.4 4.1 4.0  CL 107 104 105 106 105  CO2 25 25 24 24 24   GLUCOSE 113* 141* 180* 158* 126*  BUN 13 14 14 22  29*  CREATININE 0.75 0.80 0.66 0.75 0.82  CALCIUM 8.8* 8.9 8.8* 8.9 8.6*  AST  --  46* 33 26 20  ALT  --  42 40 43 39  ALKPHOS  --  62 59 54 50  BILITOT  --  0.7 0.7 0.7 0.7  ALBUMIN  --  3.2* 2.8* 2.6* 2.5*  MG  --   --  1.8 1.9 2.1  CRP  --  2.1* 2.5* 0.9 0.7  DDIMER  --  0.39 0.31 <0.27 0.38  PROCALCITON  --  <0.10  --  <0.10 <0.10  LATICACIDVEN  --  1.4  --   --   --   BNP  --   --  169.5* 81.8 65.6    ------------------------------------------------------------------------------------------------------------------ Recent Labs    04/24/20 1435  TRIG 234*    Lab Results  Component Value Date   HGBA1C 6.0 (H) 02/06/2020    ------------------------------------------------------------------------------------------------------------------ No results for input(s): TSH, T4TOTAL, T3FREE, THYROIDAB in the last 72 hours.  Invalid input(s): FREET3  Cardiac Enzymes No results for input(s): CKMB, TROPONINI, MYOGLOBIN in the last 168 hours.  Invalid input(s): CK ------------------------------------------------------------------------------------------------------------------    Component Value Date/Time   BNP 65.6 04/27/2020 0108    Micro Results Recent Results (from the past 240 hour(s))  Blood Culture (routine x 2)     Status: None (Preliminary result)   Collection Time: 04/24/20  2:35 PM   Specimen: BLOOD LEFT ARM  Result Value Ref Range Status   Specimen Description BLOOD LEFT ARM  Final   Special Requests   Final    BOTTLES DRAWN AEROBIC AND ANAEROBIC Blood Culture results may not be optimal due to an excessive volume of blood received in culture bottles   Culture   Final    NO GROWTH 2 DAYS Performed at [facility], [place] [address], [place], [place] [address]    Report Status PENDING  Incomplete  Blood Culture (routine x 2)     Status: Abnormal   Collection Time: 04/24/20 11:34 PM   Specimen: BLOOD  Result Value Ref Range Status   Specimen Description BLOOD LEFT ARM  Final   Special Requests   Final    BOTTLES DRAWN AEROBIC AND ANAEROBIC Blood Culture adequate volume   Culture  Setup Time   Final    GRAM POSITIVE COCCI IN BOTH AEROBIC AND ANAEROBIC BOTTLES Organism ID to follow CRITICAL RESULT CALLED TO, READ BACK BY AND VERIFIED WITH: PHARMD J CARNEY 04/25/20 AT 2055 SK     Culture (A)  Final    STREPTOCOCCUS MITIS/ORALIS THE SIGNIFICANCE OF ISOLATING THIS ORGANISM FROM A SINGLE SET OF BLOOD CULTURES WHEN MULTIPLE SETS ARE DRAWN IS UNCERTAIN. PLEASE NOTIFY THE MICROBIOLOGY DEPARTMENT WITHIN ONE WEEK IF SPECIATION AND SENSITIVITIES ARE REQUIRED. Performed at [facility],  [place] [address], [place], [place] [address]    Report Status 04/26/2020 FINAL  Final  Blood Culture ID Panel (Reflexed)     Status: Abnormal   Collection Time: 04/24/20 11:34 PM  Result Value Ref Range Status   Enterococcus faecalis NOT DETECTED NOT DETECTED Final   Enterococcus Faecium NOT DETECTED NOT DETECTED Final   Listeria monocytogenes NOT DETECTED NOT DETECTED Final   Staphylococcus species NOT DETECTED NOT DETECTED Final   Staphylococcus aureus (BCID) NOT DETECTED NOT DETECTED Final   Staphylococcus epidermidis NOT DETECTED NOT DETECTED Final   Staphylococcus lugdunensis NOT DETECTED NOT DETECTED Final   Streptococcus species DETECTED (A) NOT DETECTED Final    Comment: Not Enterococcus species, Streptococcus agalactiae, Streptococcus pyogenes, or Streptococcus pneumoniae. CRITICAL RESULT CALLED TO, READ BACK BY AND VERIFIED WITH: PHARMD J CARNEY 04/25/20 AT 2055 SK     Streptococcus agalactiae NOT DETECTED NOT DETECTED Final   Streptococcus pneumoniae NOT DETECTED NOT DETECTED Final   Streptococcus pyogenes NOT DETECTED NOT DETECTED Final   A.calcoaceticus-baumannii NOT DETECTED NOT DETECTED Final   Bacteroides fragilis NOT DETECTED NOT DETECTED Final   Enterobacterales NOT DETECTED NOT DETECTED Final   Enterobacter cloacae complex NOT DETECTED NOT DETECTED Final   Escherichia coli NOT DETECTED NOT DETECTED Final   Klebsiella aerogenes NOT DETECTED NOT DETECTED Final   Klebsiella oxytoca NOT DETECTED NOT DETECTED Final   Klebsiella pneumoniae  NOT DETECTED NOT DETECTED Final   Proteus species NOT DETECTED NOT DETECTED Final   Salmonella species NOT DETECTED NOT DETECTED Final   Serratia marcescens NOT DETECTED NOT DETECTED Final   Haemophilus influenzae NOT DETECTED NOT DETECTED Final   Neisseria meningitidis NOT DETECTED NOT DETECTED Final   Pseudomonas aeruginosa NOT DETECTED NOT DETECTED Final   Stenotrophomonas maltophilia NOT DETECTED NOT DETECTED Final   Candida albicans  NOT DETECTED NOT DETECTED Final   Candida auris NOT DETECTED NOT DETECTED Final   Candida glabrata NOT DETECTED NOT DETECTED Final   Candida krusei NOT DETECTED NOT DETECTED Final   Candida parapsilosis NOT DETECTED NOT DETECTED Final   Candida tropicalis NOT DETECTED NOT DETECTED Final   Cryptococcus neoformans/gattii NOT DETECTED NOT DETECTED Final    Comment: Performed at [facility], [place] [address], [place], [place] [address]    Radiology Reports DG Chest Portable 1 View  Result Date: 04/24/2020 CLINICAL DATA:  Shortness of breath on exertion. Hypoxia. COVID-19 positive test on 04/14/2020. EXAM: PORTABLE CHEST 1 VIEW COMPARISON:  Chest CT dated 04/07/2019. FINDINGS: Normal sized heart. Mild diffuse prominence of the interstitial markings without Kerley lines. No pleural fluid. Diffuse osteopenia. 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 Electronically Signed   By: Claudie Revering M.D.   On: 04/24/2020 12:07

## 2020-04-28 ENCOUNTER — Inpatient Hospital Stay (HOSPITAL_COMMUNITY): Payer: Medicare Other

## 2020-04-28 DIAGNOSIS — J9601 Acute respiratory failure with hypoxia: Secondary | ICD-10-CM | POA: Diagnosis not present

## 2020-04-28 DIAGNOSIS — U071 COVID-19: Secondary | ICD-10-CM | POA: Diagnosis not present

## 2020-04-28 LAB — COMPREHENSIVE METABOLIC PANEL
ALT: 36 U/L (ref 0–44)
AST: 19 U/L (ref 15–41)
Albumin: 2.6 g/dL — ABNORMAL LOW (ref 3.5–5.0)
Alkaline Phosphatase: 55 U/L (ref 38–126)
Anion gap: 10 (ref 5–15)
BUN: 27 mg/dL — ABNORMAL HIGH (ref 8–23)
CO2: 22 mmol/L (ref 22–32)
Calcium: 8.5 mg/dL — ABNORMAL LOW (ref 8.9–10.3)
Chloride: 108 mmol/L (ref 98–111)
Creatinine, Ser: 0.76 mg/dL (ref 0.61–1.24)
GFR, Estimated: >60 mL/min (ref >60)
Glucose, Bld: 132 mg/dL — ABNORMAL HIGH (ref 70–99)
Potassium: 4.2 mmol/L (ref 3.5–5.1)
Sodium: 140 mmol/L (ref 135–145)
Total Bilirubin: 0.8 mg/dL (ref 0.3–1.2)
Total Protein: 5.6 g/dL — ABNORMAL LOW (ref 6.5–8.1)

## 2020-04-28 LAB — CBC WITH DIFFERENTIAL/PLATELET
Abs Immature Granulocytes: 0.11 10*3/uL — ABNORMAL HIGH (ref 0.00–0.07)
Basophils Absolute: 0 10*3/uL (ref 0.0–0.1)
Basophils Relative: 0 %
Eosinophils Absolute: 0 10*3/uL (ref 0.0–0.5)
Eosinophils Relative: 0 %
HCT: 38.7 % — ABNORMAL LOW (ref 39.0–52.0)
Hemoglobin: 12.7 g/dL — ABNORMAL LOW (ref 13.0–17.0)
Immature Granulocytes: 1 %
Lymphocytes Relative: 6 %
Lymphs Abs: 0.9 10*3/uL (ref 0.7–4.0)
MCH: 30.7 pg (ref 26.0–34.0)
MCHC: 32.8 g/dL (ref 30.0–36.0)
MCV: 93.5 fL (ref 80.0–100.0)
Monocytes Absolute: 0.9 10*3/uL (ref 0.1–1.0)
Monocytes Relative: 6 %
Neutro Abs: 14.2 10*3/uL — ABNORMAL HIGH (ref 1.7–7.7)
Neutrophils Relative %: 87 %
Platelets: 298 10*3/uL (ref 150–400)
RBC: 4.14 MIL/uL — ABNORMAL LOW (ref 4.22–5.81)
RDW: 13.2 % (ref 11.5–15.5)
WBC: 16.1 10*3/uL — ABNORMAL HIGH (ref 4.0–10.5)
nRBC: 0 % (ref 0.0–0.2)

## 2020-04-28 LAB — GLUCOSE, CAPILLARY
Glucose-Capillary: 130 mg/dL — ABNORMAL HIGH (ref 70–99)
Glucose-Capillary: 96 mg/dL (ref 70–99)

## 2020-04-28 LAB — ECHOCARDIOGRAM LIMITED
Area-P 1/2: 4.15 cm2
Height: 63 in
P 1/2 time: 702 msec
S' Lateral: 3.3 cm
Weight: 2821.89 oz

## 2020-04-28 LAB — MAGNESIUM: Magnesium: 2 mg/dL (ref 1.7–2.4)

## 2020-04-28 LAB — BRAIN NATRIURETIC PEPTIDE: B Natriuretic Peptide: 55 pg/mL (ref 0.0–100.0)

## 2020-04-28 LAB — PROCALCITONIN: Procalcitonin: <0.1 ng/mL

## 2020-04-28 LAB — D-DIMER, QUANTITATIVE: D-Dimer, Quant: <0.27 ug/mL-FEU (ref 0.00–0.50)

## 2020-04-28 LAB — C-REACTIVE PROTEIN: CRP: 0.8 mg/dL (ref <1.0)

## 2020-04-28 MED ORDER — ALBUTEROL SULFATE HFA 108 (90 BASE) MCG/ACT IN AERS: 2.0000 | INHALATION_SPRAY | Freq: Four times a day (QID) | RESPIRATORY_TRACT | 0 refills | Status: DC | PRN

## 2020-04-28 MED ORDER — AMOXICILLIN 875 MG PO TABS
875.0000 mg | ORAL_TABLET | Freq: Two times a day (BID) | ORAL | 0 refills | Status: DC
Start: 2020-04-28 — End: 2020-08-03

## 2020-04-28 NOTE — Progress Notes (Signed)
SATURATION QUALIFICATIONS: (This note is used to comply with regulatory documentation for home oxygen)  Patient Saturations on Room Air at Rest =94%  Patient Saturations on Room Air while Ambulating = 91%   Please briefly explain why patient needs home oxygen: pt does not qualify for home 02, did not drop bellow 91% while ambulating on RA

## 2020-04-28 NOTE — Progress Notes (Signed)
  Echocardiogram 2D Echocardiogram has been performed.  Martin Navarro 04/28/2020, 11:09 AM

## 2020-04-28 NOTE — Care Management Important Message (Signed)
Important Message  Patient Details  Name: Martin Navarro MRN: [id number] Date of Birth: 1950/09/23   Medicare Important Message Given:  Yes - Important Message mailed due to current National Emergency  Verbal consent obtained due to current National Emergency  Relationship to patient: Child Contact Name: [name] Call Date: 04/28/20  Time: 1440 Phone: [phone] Outcome: Spoke with contact Important Message mailed to: Patient address on file    Delorse Lek 04/28/2020, 2:43 PM

## 2020-04-28 NOTE — Discharge Summary (Signed)
Martin Navarro [id number] DOB: May 18, 1950 DOA: 04/24/2020  PCP: [name], MD  Admit date: 04/24/2020  Discharge date: 04/28/2020  Admitted From: Home   Disposition:  Home   Recommendations for Outpatient Follow-up:   Follow up with PCP in 1-2 weeks  PCP Please obtain BMP/CBC, 2 view CXR in 1week,  (see Discharge instructions)   PCP Please follow up on the following pending results: Kindly follow final blood culture results.   Home Health: None   Equipment/Devices: Home oxygen if he qualifies upon ambulation Consultations: None  Discharge Condition: Stable    CODE STATUS: Full    Diet Recommendation: Heart Healthy   Diet Order            Diet - low sodium heart healthy           Diet regular Room service appropriate? Yes; Fluid consistency: Thin  Diet effective now                  Chief Complaint  Patient presents with  . Covid Positive  . Shortness of Breath     Brief history of present illness from the day of admission and additional interim summary    Martin Navarro a 70 y.o.malewith medical history significant ofpsoriasis; HLD; COPD presenting with worsening COVID symptoms. He developed symptoms on 1/18, after his girlfriend tested positive for COVID, used outpatient prednisone and azithromycin for a few days without much help.  In the ER was diagnosed with acute hypoxic respiratory failure due to COVID-19 pneumonia and admitted to the hospital.                                                                  Hospital Course   1. Acute Hypoxic Resp. Failure due to Acute Covid 19 Viral Pneumonitis patient who has taken 2 shots of mRNA vaccine so far - he had mild to moderate parenchymal lung injury responded very well to short course of steroids and Remdesivir, currently  symptom-free on room air, finished his treatment course, already has hand-held rescue inhaler at home, at this point he will be discharged home with the rescue inhaler and PCP follow-up.  Will ambulate him and in case he requires oxygen upon ambulation that will be ordered.  He has steroid-induced leukocytosis, PCP to repeat CBC, CMP and a two-view chest x-ray in 7 to 10 days.   Recent Labs  Lab 04/24/20 1130 04/24/20 1435 04/25/20 0243 04/26/20 0238 04/27/20 0108 04/28/20 0149  WBC 12.0*  --  7.5 15.0* 16.4* 16.1*  CRP  --  2.1* 2.5* 0.9 0.7 0.8  DDIMER  --  0.39 0.31 <0.27 0.38 <0.27  BNP  --   --  169.5* 81.8 65.6 55.0  PROCALCITON  --  <0.10  --  <0.10 <0.10 <  0.10  LATICACIDVEN  --  1.4  --   --   --   --   AST  --  46* 33 26 20 19   ALT  --  42 40 43 39 36  ALKPHOS  --  62 59 54 50 55  BILITOT  --  0.7 0.7 0.7 0.7 0.8  ALBUMIN  --  3.2* 2.8* 2.6* 2.5* 2.6*       3.  Essential hypertension.  On ARB.  4.  Dyslipidemia.  On statin.  5.  GERD.  PPI.  6.  History of COPD.  Supportive care.  He is vaping, counseled to quit.   7. 1/2 blood cultures growing strep, ?  Contamination, procalcitonin throughout has been unremarkable, and leukocyte count was normal, he has developed leukocytosis only after steroid exposure.  Afebrile throughout appears nontoxic stable echocardiogram, repeat blood cultures negative thus far.  Most likely this is contamination Case was discussed over the phone with Dr. Johnnye Sima and ID pharmacist Cristal Generous.  We will give him a week of oral amoxicillin, follow with PCP, request PCP to check final blood culture results in a week.     Discharge diagnosis     Principal Problem:   Acute hypoxemic respiratory failure due to COVID-19 [facility]) Active Problems:   Hyperlipidemia   Hypertension   COPD (chronic obstructive pulmonary disease) ([place])    Discharge instructions    Discharge Instructions    Diet - low sodium heart healthy   Complete by: As  directed    Discharge instructions   Complete by: As directed    Follow with Primary MD [name], MD in 7 days follow final blood culture results that visit.  Get CBC, CMP, 2 view Chest X ray -  checked next visit within 1 week by Primary MD  Activity: As tolerated with Full fall precautions use Deltoro/cane & assistance as needed  Disposition Home   Diet: Heart Healthy    Special Instructions: If you have smoked or chewed Tobacco  in the last 2 yrs please stop smoking, stop any regular Alcohol  and or any Recreational drug use.  On your next visit with your primary care physician please Get Medicines reviewed and adjusted.  Please request your Prim.MD to go over all Hospital Tests and Procedure/Radiological results at the follow up, please get all Hospital records sent to your Prim MD by signing hospital release before you go home.  If you experience worsening of your admission symptoms, develop shortness of breath, life threatening emergency, suicidal or homicidal thoughts you must seek medical attention immediately by calling 911 or calling your MD immediately  if symptoms less severe.  You Must read complete instructions/literature along with all the possible adverse reactions/side effects for all the Medicines you take and that have been prescribed to you. Take any new Medicines after you have completely understood and accpet all the possible adverse reactions/side effects.   Increase activity slowly   Complete by: As directed       Discharge Medications   Allergies as of 04/28/2020      Reactions   Sulfa Antibiotics    Unknown      Medication List    TAKE these medications   albuterol 108 (90 Base) MCG/ACT inhaler Commonly known as: VENTOLIN HFA Inhale 2 puffs into the lungs every 6 (six) hours as needed for wheezing or shortness of breath.   amoxicillin 875 MG tablet Commonly known as: AMOXIL Take 1 tablet (875 mg total)  by mouth 2 (two) times daily.    aspirin 81 MG tablet Take 81 mg by mouth daily.   ELDERBERRY PO Take 1 tablet by mouth daily.   Fish Oil 1200 MG Caps Take 1,200 mg by mouth daily.   guaiFENesin-codeine 100-10 MG/5ML syrup Take 5 mLs by mouth every 6 (six) hours as needed for cough.   losartan 50 MG tablet Commonly known as: COZAAR TAKE 1 TABLET(50 MG) BY MOUTH DAILY What changed: See the new instructions.   omeprazole 40 MG capsule Commonly known as: PRILOSEC TAKE 1 CAPSULE BY MOUTH EVERY DAY   oxymetazoline 0.05 % nasal spray Commonly known as: AFRIN Place 2 sprays 2 (two) times daily as needed into both nostrils for congestion.   rosuvastatin 10 MG tablet Commonly known as: CRESTOR TAKE 1 TABLET(10 MG) BY MOUTH DAILY What changed: See the new instructions.   vitamin B-12 1000 MCG tablet Commonly known as: CYANOCOBALAMIN Take 1,000-5,000 mcg by mouth daily.   vitamin C 500 MG tablet Commonly known as: ASCORBIC ACID Take 500 mg by mouth daily.   vitamin E 180 MG (400 UNITS) capsule Take 1,200 Units by mouth daily.            Durable Medical Equipment  (From admission, onward)         Start     Ordered   04/26/20 1106  For home use only DME oxygen  Once       Question Answer Comment  Length of Need 6 Months   Mode or (Route) Nasal cannula   Liters per Minute 2   Frequency Continuous (stationary and portable oxygen unit needed)   Oxygen conserving device Yes   Oxygen delivery system Gas      04/26/20 1105           Follow-up Information    Burchette, Alinda Sierras, MD. Schedule an appointment as soon as possible for a visit in 1 week(s).   Specialty: Family Medicine Contact information: [place] [address] [phone]               Major procedures and Radiology Reports - PLEASE review detailed and final reports thoroughly  -        DG Chest Portable 1 View  Result Date: 04/24/2020 CLINICAL DATA:  Shortness of breath on exertion. Hypoxia.  COVID-19 positive test on 04/14/2020. EXAM: PORTABLE CHEST 1 VIEW COMPARISON:  Chest CT dated 04/07/2019. FINDINGS: Normal sized heart. Mild diffuse prominence of the interstitial markings without Kerley lines. No pleural fluid. Diffuse osteopenia. 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 Electronically Signed   By: Claudie Revering M.D.   On: 04/24/2020 12:07   ECHOCARDIOGRAM LIMITED  Result Date: 04/28/2020    ECHOCARDIOGRAM LIMITED REPORT   Patient Name:   Martin Navarro Date of Exam: 04/28/2020 Medical Rec #:  [id number]      Height:       63.0 in Accession #:    [id number]     Weight:       176.4 lb Date of Birth:  Nov 16, 1950      BSA:          1.833 m Patient Age:    46 years       BP:           128/85 mmHg Patient Gender: M              HR:  68 bpm. Exam Location:  Inpatient Procedure: Limited Echo, Cardiac Doppler and Color Doppler Indications:    CHF  History:        Patient has no prior history of Echocardiogram examinations.                 COPD, Signs/Symptoms:Shortness of Breath; Risk                 Factors:Dyslipidemia and Current Smoker.  Sonographer:    Dustin Flock Referring Phys: [id number] Margaree Mackintosh [facility]  Sonographer Comments: Image acquisition challenging due to COPD. COVID+ IMPRESSIONS  1. Left ventricular ejection fraction, by estimation, is 60 to 65%. The left ventricle has normal function. The left ventricle has no regional wall motion abnormalities. Left ventricular diastolic parameters were normal.  2. Right ventricular systolic function is normal. The right ventricular size is normal. Tricuspid regurgitation signal is inadequate for assessing PA pressure.  3. The mitral valve is normal in structure. Trivial mitral valve regurgitation.  4. The aortic valve is normal in structure. Aortic valve regurgitation is trivial.  5. The inferior vena cava is dilated in size with >50% respiratory variability, suggesting right  atrial pressure of 8 mmHg. Conclusion(s)/Recommendation(s): No vegetation seen but technically difficult study with poor visualization of valves. If clinical suspicion for endocarditis, would recommend TEE. FINDINGS  Left Ventricle: Left ventricular ejection fraction, by estimation, is 60 to 65%. The left ventricle has normal function. The left ventricle has no regional wall motion abnormalities. The left ventricular internal cavity size was normal in size. There is  no left ventricular hypertrophy. Left ventricular diastolic parameters were normal. Right Ventricle: The right ventricular size is normal. No increase in right ventricular wall thickness. Right ventricular systolic function is normal. Tricuspid regurgitation signal is inadequate for assessing PA pressure. Pericardium: There is no evidence of pericardial effusion. Mitral Valve: The mitral valve is normal in structure. Trivial mitral valve regurgitation. Tricuspid Valve: The tricuspid valve is normal in structure. Tricuspid valve regurgitation is trivial. Aortic Valve: The aortic valve is normal in structure. Aortic valve regurgitation is trivial. Aortic regurgitation PHT measures 702 msec. Aorta: The aortic root is normal in size and structure. Venous: The inferior vena cava is dilated in size with greater than 50% respiratory variability, suggesting right atrial pressure of 8 mmHg. IAS/Shunts: The interatrial septum was not well visualized. LEFT VENTRICLE PLAX 2D LVIDd:         5.00 cm  Diastology LVIDs:         3.30 cm  LV e' medial:    6.96 cm/s LV PW:         1.10 cm  LV E/e' medial:  12.3 LV IVS:        0.90 cm  LV e' lateral:   8.59 cm/s LVOT diam:     2.20 cm  LV E/e' lateral: 9.9 LVOT Area:     3.80 cm  RIGHT VENTRICLE RV S prime:     9.25 cm/s LEFT ATRIUM         Index LA diam:    3.40 cm 1.85 cm/m  AORTIC VALVE AI PHT:      702 msec  AORTA Ao Root diam: 3.30 cm MITRAL VALVE MV Area (PHT): 4.15 cm    SHUNTS MV Decel Time: 183 msec    Systemic  Diam: 2.20 cm MV E velocity: 85.30 cm/s MV A velocity: 84.40 cm/s MV E/A ratio:  1.01 Oswaldo Milian MD Electronically signed by Oswaldo Milian MD Signature Date/Time:  04/28/2020/11:36:15 AM    Final     Micro Results     Recent Results (from the past 240 hour(s))  Blood Culture (routine x 2)     Status: None (Preliminary result)   Collection Time: 04/24/20  2:35 PM   Specimen: BLOOD LEFT ARM  Result Value Ref Range Status   Specimen Description BLOOD LEFT ARM  Final   Special Requests   Final    BOTTLES DRAWN AEROBIC AND ANAEROBIC Blood Culture results may not be optimal due to an excessive volume of blood received in culture bottles   Culture   Final    NO GROWTH 4 DAYS Performed at [facility], [place] [address] [place], [place] [address]    Report Status PENDING  Incomplete  Blood Culture (routine x 2)     Status: Abnormal   Collection Time: 04/24/20 11:34 PM   Specimen: BLOOD  Result Value Ref Range Status   Specimen Description BLOOD LEFT ARM  Final   Special Requests   Final    BOTTLES DRAWN AEROBIC AND ANAEROBIC Blood Culture adequate volume   Culture  Setup Time   Final    GRAM POSITIVE COCCI IN BOTH AEROBIC AND ANAEROBIC BOTTLES CRITICAL RESULT CALLED TO, READ BACK BY AND VERIFIED WITH: PHARMD J CARNEY 04/25/20 AT 2055 SK     Culture (A)  Final    STREPTOCOCCUS MITIS/ORALIS THE SIGNIFICANCE OF ISOLATING THIS ORGANISM FROM A SINGLE SET OF BLOOD CULTURES WHEN MULTIPLE SETS ARE DRAWN IS UNCERTAIN. PLEASE NOTIFY THE MICROBIOLOGY DEPARTMENT WITHIN ONE WEEK IF SPECIATION AND SENSITIVITIES ARE REQUIRED. Performed at [facility], [place] [address], [place], [place] [address]    Report Status 04/26/2020 FINAL  Final  Blood Culture ID Panel (Reflexed)     Status: Abnormal   Collection Time: 04/24/20 11:34 PM  Result Value Ref Range Status   Enterococcus faecalis NOT DETECTED NOT DETECTED Final   Enterococcus Faecium NOT DETECTED NOT DETECTED Final    Listeria monocytogenes NOT DETECTED NOT DETECTED Final   Staphylococcus species NOT DETECTED NOT DETECTED Final   Staphylococcus aureus (BCID) NOT DETECTED NOT DETECTED Final   Staphylococcus epidermidis NOT DETECTED NOT DETECTED Final   Staphylococcus lugdunensis NOT DETECTED NOT DETECTED Final   Streptococcus species DETECTED (A) NOT DETECTED Final    Comment: Not Enterococcus species, Streptococcus agalactiae, Streptococcus pyogenes, or Streptococcus pneumoniae. CRITICAL RESULT CALLED TO, READ BACK BY AND VERIFIED WITH: PHARMD J CARNEY 04/25/20 AT 2055 SK     Streptococcus agalactiae NOT DETECTED NOT DETECTED Final   Streptococcus pneumoniae NOT DETECTED NOT DETECTED Final   Streptococcus pyogenes NOT DETECTED NOT DETECTED Final   A.calcoaceticus-baumannii NOT DETECTED NOT DETECTED Final   Bacteroides fragilis NOT DETECTED NOT DETECTED Final   Enterobacterales NOT DETECTED NOT DETECTED Final   Enterobacter cloacae complex NOT DETECTED NOT DETECTED Final   Escherichia coli NOT DETECTED NOT DETECTED Final   Klebsiella aerogenes NOT DETECTED NOT DETECTED Final   Klebsiella oxytoca NOT DETECTED NOT DETECTED Final   Klebsiella pneumoniae NOT DETECTED NOT DETECTED Final   Proteus species NOT DETECTED NOT DETECTED Final   Salmonella species NOT DETECTED NOT DETECTED Final   Serratia marcescens NOT DETECTED NOT DETECTED Final   Haemophilus influenzae NOT DETECTED NOT DETECTED Final   Neisseria meningitidis NOT DETECTED NOT DETECTED Final   Pseudomonas aeruginosa NOT DETECTED NOT DETECTED Final   Stenotrophomonas maltophilia NOT DETECTED NOT DETECTED Final   Candida albicans NOT DETECTED NOT DETECTED Final  Candida auris NOT DETECTED NOT DETECTED Final   Candida glabrata NOT DETECTED NOT DETECTED Final   Candida krusei NOT DETECTED NOT DETECTED Final   Candida parapsilosis NOT DETECTED NOT DETECTED Final   Candida tropicalis NOT DETECTED NOT DETECTED Final   Cryptococcus  neoformans/gattii NOT DETECTED NOT DETECTED Final    Comment: Performed at [facility], [place] [address] [place], [place] [address]  Culture, blood (routine x 2)     Status: None (Preliminary result)   Collection Time: 04/27/20 10:14 AM   Specimen: BLOOD RIGHT HAND  Result Value Ref Range Status   Specimen Description BLOOD RIGHT HAND  Final   Special Requests   Final    AEROBIC BOTTLE ONLY Blood Culture results may not be optimal due to an inadequate volume of blood received in culture bottles   Culture   Final    NO GROWTH < 24 HOURS Performed at [facility], [place] [address] [place], [place] [address]    Report Status PENDING  Incomplete  Culture, blood (routine x 2)     Status: None (Preliminary result)   Collection Time: 04/27/20 10:14 AM   Specimen: BLOOD LEFT HAND  Result Value Ref Range Status   Specimen Description BLOOD LEFT HAND  Final   Special Requests   Final    BOTTLES DRAWN AEROBIC AND ANAEROBIC Blood Culture adequate volume   Culture   Final    NO GROWTH < 24 HOURS Performed at [facility], [place] [address], [place], [place] [address]    Report Status PENDING  Incomplete    Navarro   Subjective    Martin Navarro has no headache,no chest abdominal pain,no new weakness tingling or numbness, feels much better wants to go home Navarro.     Objective   Blood pressure (!) 141/116, pulse 72, temperature 98.2 F (36.8 C), temperature source Oral, resp. rate 19, height 5\' 3"  (1.6 m), weight 80 kg, SpO2 94 %.   Intake/Output Summary (Last 24 hours) at 04/28/2020 1243 Last data filed at 04/27/2020 2000 Gross per 24 hour  Intake 680 ml  Output --  Net 680 ml    Exam  Awake Alert, No new F.N deficits, Normal affect [place].AT,PERRAL Supple Neck,No JVD, No cervical lymphadenopathy appriciated.  Symmetrical Chest wall movement, Good air movement bilaterally, CTAB RRR,No Gallops,Rubs or new Murmurs, No Parasternal Heave +ve B.Sounds, Abd Soft, Non  tender, No organomegaly appriciated, No rebound -guarding or rigidity. No Cyanosis, Clubbing or edema, No new Rash or bruise   Data Review   CBC w Diff:  Lab Results  Component Value Date   WBC 16.1 (H) 04/28/2020   HGB 12.7 (L) 04/28/2020   HCT 38.7 (L) 04/28/2020   PLT 298 04/28/2020   LYMPHOPCT 6 04/28/2020   MONOPCT 6 04/28/2020   EOSPCT 0 04/28/2020   BASOPCT 0 04/28/2020    CMP:  Lab Results  Component Value Date   NA 140 04/28/2020   K 4.2 04/28/2020   CL 108 04/28/2020   CO2 22 04/28/2020   BUN 27 (H) 04/28/2020   CREATININE 0.76 04/28/2020   CREATININE 0.92 02/06/2020   PROT 5.6 (L) 04/28/2020   ALBUMIN 2.6 (L) 04/28/2020   BILITOT 0.8 04/28/2020   ALKPHOS 55 04/28/2020   AST 19 04/28/2020   ALT 36 04/28/2020  .   Total Time in preparing paper work, data evaluation and todays exam - 4 minutes  Lala Lund M.D on 04/28/2020 at 12:43  PM  [facility]

## 2020-04-28 NOTE — Discharge Instructions (Signed)
Follow with Primary MD [name], MD in 7 days follow final blood culture results that visit.  Get CBC, CMP, 2 view Chest X ray -  checked next visit within 1 week by Primary MD  Activity: As tolerated with Full fall precautions use [place]/cane & assistance as needed  Disposition Home   Diet: Heart Healthy    Special Instructions: If you have smoked or chewed Tobacco  in the last 2 yrs please stop smoking, stop any regular Alcohol  and or any Recreational drug use.  On your next visit with your primary care physician please Get Medicines reviewed and adjusted.  Please request your Prim.MD to go over all Hospital Tests and Procedure/Radiological results at the follow up, please get all Hospital records sent to your Prim MD by signing hospital release before you go home.  If you experience worsening of your admission symptoms, develop shortness of breath, life threatening emergency, suicidal or homicidal thoughts you must seek medical attention immediately by calling 911 or calling your MD immediately  if symptoms less severe.  You Must read complete instructions/literature along with all the possible adverse reactions/side effects for all the Medicines you take and that have been prescribed to you. Take any new Medicines after you have completely understood and accpet all the possible adverse reactions/side effects.       Person Under Monitoring Name: Martin Navarro  Location: [address] [place] [address]   Infection Prevention Recommendations for Individuals Confirmed to have, or Being Evaluated for, 2019 Novel Coronavirus (COVID-19) Infection Who Receive Care at Home  Individuals who are confirmed to have, or are being evaluated for, COVID-19 should follow the prevention steps below until a healthcare provider or local or state health department says they can return to normal activities.  Stay home except to get medical care You should restrict activities outside your  home, except for getting medical care. Do not go to work, school, or public areas, and do not use public transportation or taxis.  Call ahead before visiting your doctor Before your medical appointment, call the healthcare provider and tell them that you have, or are being evaluated for, COVID-19 infection. This will help the healthcare provider's office take steps to keep other people from getting infected. Ask your healthcare provider to call the local or state health department.  Monitor your symptoms Seek prompt medical attention if your illness is worsening (e.g., difficulty breathing). Before going to your medical appointment, call the healthcare provider and tell them that you have, or are being evaluated for, COVID-19 infection. Ask your healthcare provider to call the local or state health department.  Wear a facemask You should wear a facemask that covers your nose and mouth when you are in the same room with other people and when you visit a healthcare provider. People who live with or visit you should also wear a facemask while they are in the same room with you.  Separate yourself from other people in your home As much as possible, you should stay in a different room from other people in your home. Also, you should use a separate bathroom, if available.  Avoid sharing household items You should not share dishes, drinking glasses, cups, eating utensils, towels, bedding, or other items with other people in your home. After using these items, you should wash them thoroughly with soap and water.  Cover your coughs and sneezes Cover your mouth and nose with a tissue when you cough or sneeze, or  you can cough or sneeze into your sleeve. Throw used tissues in a lined trash can, and immediately wash your hands with soap and water for at least 20 seconds or use an alcohol-based hand rub.  Wash your [facility] your hands often and thoroughly with soap and water for at least 20  seconds. You can use an alcohol-based hand sanitizer if soap and water are not available and if your hands are not visibly dirty. Avoid touching your eyes, nose, and mouth with unwashed hands.   Prevention Steps for Caregivers and Household Members of Individuals Confirmed to have, or Being Evaluated for, COVID-19 Infection Being Cared for in the Home  If you live with, or provide care at home for, a person confirmed to have, or being evaluated for, COVID-19 infection please follow these guidelines to prevent infection:  Follow healthcare provider's instructions Make sure that you understand and can help the patient follow any healthcare provider instructions for all care.  Provide for the patient's basic needs You should help the patient with basic needs in the home and provide support for getting groceries, prescriptions, and other personal needs.  Monitor the patient's symptoms If they are getting sicker, call his or her medical provider and tell them that the patient has, or is being evaluated for, COVID-19 infection. This will help the healthcare provider's office take steps to keep other people from getting infected. Ask the healthcare provider to call the local or state health department.  Limit the number of people who have contact with the patient  If possible, have only one caregiver for the patient.  Other household members should stay in another home or place of residence. If this is not possible, they should stay  in another room, or be separated from the patient as much as possible. Use a separate bathroom, if available.  Restrict visitors who do not have an essential need to be in the home.  Keep older adults, very young children, and other sick people away from the patient Keep older adults, very young children, and those who have compromised immune systems or chronic health conditions away from the patient. This includes people with chronic heart, lung, or kidney  conditions, diabetes, and cancer.  Ensure good ventilation Make sure that shared spaces in the home have good air flow, such as from an air conditioner or an opened window, weather permitting.  Wash your hands often  Wash your hands often and thoroughly with soap and water for at least 20 seconds. You can use an alcohol based hand sanitizer if soap and water are not available and if your hands are not visibly dirty.  Avoid touching your eyes, nose, and mouth with unwashed hands.  Use disposable paper towels to dry your hands. If not available, use dedicated cloth towels and replace them when they become wet.  Wear a facemask and gloves  Wear a disposable facemask at all times in the room and gloves when you touch or have contact with the patient's blood, body fluids, and/or secretions or excretions, such as sweat, saliva, sputum, nasal mucus, vomit, urine, or feces.  Ensure the mask fits over your nose and mouth tightly, and do not touch it during use.  Throw out disposable facemasks and gloves after using them. Do not reuse.  Wash your hands immediately after removing your facemask and gloves.  If your personal clothing becomes contaminated, carefully remove clothing and launder. Wash your hands after handling contaminated clothing.  Place all used disposable facemasks,  gloves, and other waste in a lined container before disposing them with other household waste.  Remove gloves and wash your hands immediately after handling these items.  Do not share dishes, glasses, or other household items with the patient  Avoid sharing household items. You should not share dishes, drinking glasses, cups, eating utensils, towels, bedding, or other items with a patient who is confirmed to have, or being evaluated for, COVID-19 infection.  After the person uses these items, you should wash them thoroughly with soap and water.  Wash laundry thoroughly  Immediately remove and wash clothes or  bedding that have blood, body fluids, and/or secretions or excretions, such as sweat, saliva, sputum, nasal mucus, vomit, urine, or feces, on them.  Wear gloves when handling laundry from the patient.  Read and follow directions on labels of laundry or clothing items and detergent. In general, wash and dry with the warmest temperatures recommended on the label.  Clean all areas the individual has used often  Clean all touchable surfaces, such as counters, tabletops, doorknobs, bathroom fixtures, toilets, phones, keyboards, tablets, and bedside tables, every day. Also, clean any surfaces that may have blood, body fluids, and/or secretions or excretions on them.  Wear gloves when cleaning surfaces the patient has come in contact with.  Use a diluted bleach solution (e.g., dilute bleach with 1 part bleach and 10 parts water) or a household disinfectant with a label that says EPA-registered for coronaviruses. To make a bleach solution at home, add 1 tablespoon of bleach to 1 quart (4 cups) of water. For a larger supply, add  cup of bleach to 1 gallon (16 cups) of water.  Read labels of cleaning products and follow recommendations provided on product labels. Labels contain instructions for safe and effective use of the cleaning product including precautions you should take when applying the product, such as wearing gloves or eye protection and making sure you have good ventilation during use of the product.  Remove gloves and wash hands immediately after cleaning.  Monitor yourself for signs and symptoms of illness Caregivers and household members are considered close contacts, should monitor their health, and will be asked to limit movement outside of the home to the extent possible. Follow the monitoring steps for close contacts listed on the symptom monitoring form.   ? If you have additional questions, contact your local health department or call the epidemiologist on call at  [phone] (available 24/7). ? This guidance is subject to change. For the most up-to-date guidance from [facility], please refer to their website: [email or web address]

## 2020-04-28 NOTE — Plan of Care (Incomplete)

## 2020-04-29 ENCOUNTER — Encounter: Payer: Self-pay | Admitting: Family Medicine

## 2020-04-29 LAB — CULTURE, BLOOD (ROUTINE X 2): Culture: NO GROWTH

## 2020-05-02 LAB — CULTURE, BLOOD (ROUTINE X 2)
Culture: NO GROWTH
Culture: NO GROWTH
Special Requests: ADEQUATE

## 2020-05-03 ENCOUNTER — Ambulatory Visit: Payer: Medicare Other

## 2020-05-03 ENCOUNTER — Other Ambulatory Visit: Payer: Self-pay | Admitting: *Deleted

## 2020-05-03 DIAGNOSIS — Z87891 Personal history of nicotine dependence: Secondary | ICD-10-CM

## 2020-05-03 DIAGNOSIS — F17210 Nicotine dependence, cigarettes, uncomplicated: Secondary | ICD-10-CM

## 2020-05-05 ENCOUNTER — Ambulatory Visit (INDEPENDENT_AMBULATORY_CARE_PROVIDER_SITE_OTHER): Payer: Medicare Other | Admitting: Family Medicine

## 2020-05-05 ENCOUNTER — Other Ambulatory Visit: Payer: Self-pay

## 2020-05-05 ENCOUNTER — Ambulatory Visit (INDEPENDENT_AMBULATORY_CARE_PROVIDER_SITE_OTHER): Payer: Medicare Other

## 2020-05-05 ENCOUNTER — Encounter: Payer: Self-pay | Admitting: Family Medicine

## 2020-05-05 VITALS — BP 128/70 | HR 88 | Ht 63.0 in | Wt 172.0 lb

## 2020-05-05 DIAGNOSIS — U071 COVID-19: Secondary | ICD-10-CM | POA: Diagnosis not present

## 2020-05-05 DIAGNOSIS — R0600 Dyspnea, unspecified: Secondary | ICD-10-CM | POA: Diagnosis not present

## 2020-05-05 LAB — CBC WITH DIFFERENTIAL/PLATELET
Basophils Absolute: 0.1 10*3/uL (ref 0.0–0.1)
Basophils Relative: 0.9 % (ref 0.0–3.0)
Eosinophils Absolute: 0.1 10*3/uL (ref 0.0–0.7)
Eosinophils Relative: 1.4 % (ref 0.0–5.0)
HCT: 38.4 % — ABNORMAL LOW (ref 39.0–52.0)
Hemoglobin: 13 g/dL (ref 13.0–17.0)
Lymphocytes Relative: 14.5 % (ref 12.0–46.0)
Lymphs Abs: 1.4 10*3/uL (ref 0.7–4.0)
MCHC: 33.9 g/dL (ref 30.0–36.0)
MCV: 92.3 fl (ref 78.0–100.0)
Monocytes Absolute: 1 10*3/uL (ref 0.1–1.0)
Monocytes Relative: 10.2 % (ref 3.0–12.0)
Neutro Abs: 7.1 10*3/uL (ref 1.4–7.7)
Neutrophils Relative %: 73 % (ref 43.0–77.0)
Platelets: 280 10*3/uL (ref 150.0–400.0)
RBC: 4.16 Mil/uL — ABNORMAL LOW (ref 4.22–5.81)
RDW: 13.8 % (ref 11.5–15.5)
WBC: 9.7 10*3/uL (ref 4.0–10.5)

## 2020-05-05 LAB — COMPREHENSIVE METABOLIC PANEL
ALT: 16 U/L (ref 0–53)
AST: 14 U/L (ref 0–37)
Albumin: 3.2 g/dL — ABNORMAL LOW (ref 3.5–5.2)
Alkaline Phosphatase: 69 U/L (ref 39–117)
BUN: 7 mg/dL (ref 6–23)
CO2: 28 mEq/L (ref 19–32)
Calcium: 8.6 mg/dL (ref 8.4–10.5)
Chloride: 107 mEq/L (ref 96–112)
Creatinine, Ser: 0.78 mg/dL (ref 0.40–1.50)
GFR: 90.1 mL/min (ref >60.00)
Glucose, Bld: 107 mg/dL — ABNORMAL HIGH (ref 70–99)
Potassium: 4.2 mEq/L (ref 3.5–5.1)
Sodium: 141 mEq/L (ref 135–145)
Total Bilirubin: 0.8 mg/dL (ref 0.2–1.2)
Total Protein: 5.9 g/dL — ABNORMAL LOW (ref 6.0–8.3)

## 2020-05-05 NOTE — Patient Instructions (Signed)
Follow up for any fever or increased shortness of breath  At this point would only use the Albuterol inhaler as needed.

## 2020-05-05 NOTE — Progress Notes (Signed)
Established Patient Office Visit  Subjective:  Patient ID: Martin Navarro, male    DOB: 12-10-1950  Age: 70 y.o. MRN: [id number]  CC:  Chief Complaint  Patient presents with  . Hospitalization Follow-up    HPI Martin Navarro presents for hospital follow-up from recent COVID-19 infection.  He does have some chronic COPD as well as hypertension history.  He had received 2 Covid vaccines but not booster yet.  He was admitted on 29 January and discharged February 2.  Admitted with worsening Covid symptoms.  His girlfriend tested positive for Covid and he developed symptoms on 18 January.  He was seen initially in urgent care and treated with steroids and Zithromax without improvement.  He had some increased shortness of breath and was admitted from the ER with acute hypoxic respiratory failure.  He received IV remdesivir along with steroids..  Is treated with oxygen and at discharge had room air O2 sats 94% and this only dropped to 91% with ambulation.  He is doing well at home at this time without oxygen.  He had leukocytosis likely related to steroids.  Recommendation was for outpatient labs and follow-up chest x-ray.  He has some mild fatigue but work all day yesterday.  No fever.  Mild dyspnea only with exertion.  No chest pains.  He had 1 out of 2 culture positive from January 29 for Streptococcus species.  Subsequent cultures on February 1 -.  Patient was treated with amoxicillin and still has 1 more dose.  Pro calcitonin levels remain normal throughout.  D-dimer negative.  His appetite has improved dramatically past week and he states he is "eating everything in sight".  Past Medical History:  Diagnosis Date  . Acute prostatitis 09/21/2016  . AKI (acute kidney injury) ([place])   . Asthma   . Chest pain   . COPD (chronic obstructive pulmonary disease) ([place])   . Dyspnea   . Emphysema of lung ([place])   . Epididymoorchitis 01/28/2017  . Erectile dysfunction   . Family history of ischemic heart  disease and other diseases of the circulatory system   . GERD (gastroesophageal reflux disease) 10/25/2010  . History of leukocytosis   . Hyperlipidemia   . 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 Hypokalemia   . Nausea   . Nicotine abuse 10/25/2010  . Prostatitis   . Sepsis ([place]) 09/21/2016    Past Surgical History:  Procedure Laterality Date  . SKIN TAG REMOVAL  11/2019    Family History  Problem Relation Age of Onset  . Hypertension Father   . Heart disease Father 47       CABG    Social History   Socioeconomic History  . Marital status: Divorced    Spouse name: Not on file  . Number of children: Not on file  . Years of education: Not on file  . Highest education level: Not on file  Occupational History  . Not on file  Tobacco Use  . Smoking status: Current Every Day Smoker    Packs/day: 2.00    Years: 53.00    Pack years: 106.00    Types: E-cigarettes    Last attempt to quit: 02/07/2015    Years since quitting: 5.2  . Smokeless tobacco: Current User  . Tobacco comment: Pt now vapes, quit tobacco 2016  Vaping Use  . Vaping Use: Every day  Substance and Sexual Activity  . Alcohol use: No  . Drug use: Not Currently  . Sexual activity: Yes  Other Topics Concern  . Not on file  Social History Narrative  . Not on file   Social Determinants of Health   Financial Resource Strain: Not on file  Food Insecurity: Not on file  Transportation Needs: Not on file  Physical Activity: Not on file  Stress: Not on file  Social Connections: Not on file  Intimate Partner Violence: Not on file    Outpatient Medications Prior to Visit  Medication Sig Dispense Refill  . albuterol (VENTOLIN HFA) 108 (90 Base) MCG/ACT inhaler Inhale 2 puffs into the lungs every 6 (six) hours as needed for wheezing or shortness of breath. 6.7 g 0  . amoxicillin (AMOXIL) 875 MG tablet Take 1 tablet (875 mg total) by mouth 2 (two) times daily. 14 tablet 0  . aspirin 81 MG tablet Take 81 mg by mouth  daily.    Marland Kitchen ELDERBERRY PO Take 1 tablet by mouth daily.    Marland Kitchen guaiFENesin-codeine 100-10 MG/5ML syrup Take 5 mLs by mouth every 6 (six) hours as needed for cough.    . losartan (COZAAR) 50 MG tablet TAKE 1 TABLET(50 MG) BY MOUTH DAILY (Patient taking differently: Take 50 mg by mouth daily.) 90 tablet 1  . Omega-3 Fatty Acids (FISH OIL) 1200 MG CAPS Take 1,200 mg by mouth daily.    Marland Kitchen omeprazole (PRILOSEC) 40 MG capsule TAKE 1 CAPSULE BY MOUTH EVERY DAY 90 capsule 1  . oxymetazoline (AFRIN) 0.05 % nasal spray Place 2 sprays 2 (two) times daily as needed into both nostrils for congestion.    . rosuvastatin (CRESTOR) 10 MG tablet TAKE 1 TABLET(10 MG) BY MOUTH DAILY (Patient taking differently: Take 10 mg by mouth daily.) 90 tablet 1  . vitamin B-12 (CYANOCOBALAMIN) 1000 MCG tablet Take 1,000-5,000 mcg by mouth daily.     . vitamin C (ASCORBIC ACID) 500 MG tablet Take 500 mg by mouth daily.    . vitamin E 400 UNIT capsule Take 1,200 Units by mouth daily.     No facility-administered medications prior to visit.    Allergies  Allergen Reactions  . Sulfa Antibiotics     Unknown    ROS Review of Systems  Constitutional: Negative for appetite change, chills and fever.  Respiratory: Negative for cough.   Cardiovascular: Negative for chest pain, palpitations and leg swelling.  Gastrointestinal: Negative for abdominal pain.  Genitourinary: Negative for dysuria.  Neurological: Negative for dizziness and headaches.  Psychiatric/Behavioral: Negative for confusion.      Objective:    Physical Exam Vitals reviewed.  Constitutional:      Appearance: Normal appearance.  Cardiovascular:     Rate and Rhythm: Normal rate and regular rhythm.  Pulmonary:     Effort: Pulmonary effort is normal.     Breath sounds: Normal breath sounds. No wheezing or rales.  Musculoskeletal:     Cervical back: Neck supple.     Right lower leg: No edema.     Left lower leg: No edema.  Lymphadenopathy:      Cervical: No cervical adenopathy.  Neurological:     Mental Status: He is alert.     BP 128/70   Pulse 88   Ht 5\' 3"  (1.6 m)   Wt 172 lb (78 kg)   SpO2 95%   BMI 30.47 kg/m  Wt Readings from Last 3 Encounters:  05/05/20 172 lb (78 kg)  04/24/20 176 lb 5.9 oz (80 kg)  02/06/20 177 lb 8 oz (80.5 kg)     There are no  preventive care reminders to display for this patient.  There are no preventive care reminders to display for this patient.  Lab Results  Component Value Date   TSH 1.60 12/24/2019   Lab Results  Component Value Date   WBC 16.1 (H) 04/28/2020   HGB 12.7 (L) 04/28/2020   HCT 38.7 (L) 04/28/2020   MCV 93.5 04/28/2020   PLT 298 04/28/2020   Lab Results  Component Value Date   NA 140 04/28/2020   K 4.2 04/28/2020   CO2 22 04/28/2020   GLUCOSE 132 (H) 04/28/2020   BUN 27 (H) 04/28/2020   CREATININE 0.76 04/28/2020   BILITOT 0.8 04/28/2020   ALKPHOS 55 04/28/2020   AST 19 04/28/2020   ALT 36 04/28/2020   PROT 5.6 (L) 04/28/2020   ALBUMIN 2.6 (L) 04/28/2020   CALCIUM 8.5 (L) 04/28/2020   ANIONGAP 10 04/28/2020   GFR 85.73 12/23/2018   Lab Results  Component Value Date   CHOL 135 12/24/2019   Lab Results  Component Value Date   HDL 43 12/24/2019   Lab Results  Component Value Date   LDLCALC 74 12/24/2019   Lab Results  Component Value Date   TRIG 234 (H) 04/24/2020   Lab Results  Component Value Date   CHOLHDL 3.1 12/24/2019   Lab Results  Component Value Date   HGBA1C 6.0 (H) 02/06/2020      Assessment & Plan:   Recent COVID-19 infection.  Patient does have some underlying COPD.  He is doing well following IV remdesivir and steroids.  O2 sat today room air with N- 95 mask in place 95% He had some leukocytosis likely related to steroids.  1 out of 2 initial blood cultures positive for Streptococcus species.  Suspect contamination.  Subsequent cultures negative.  -Check follow-up CBC, comprehensive metabolic panel, chest  x-ray -Continue albuterol on as-needed basis. -Gradually increase activities back to baseline as tolerated -Follow-up immediately for any increased shortness of breath or other concerns  No orders of the defined types were placed in this encounter.   Follow-up: No follow-ups on file.    Carolann Littler, MD

## 2020-05-07 ENCOUNTER — Telehealth: Payer: Self-pay

## 2020-05-07 NOTE — Telephone Encounter (Signed)
-----   Message from [name], MD sent at 05/06/2020 12:14 PM EST ----- Multifocal infiltrates consistent with recent Covid pneumonia.  He is clinically improving and I think it may take a few weeks to see CXR infiltrates resolving.

## 2020-05-07 NOTE — Telephone Encounter (Signed)
Patient daughter informed of results and recommendations.

## 2020-05-17 ENCOUNTER — Other Ambulatory Visit: Payer: Self-pay

## 2020-05-18 ENCOUNTER — Ambulatory Visit: Payer: Medicare Other | Admitting: Family Medicine

## 2020-05-24 ENCOUNTER — Inpatient Hospital Stay: Admission: RE | Admit: 2020-05-24 | Payer: Medicare Other | Source: Ambulatory Visit

## 2020-06-02 ENCOUNTER — Ambulatory Visit (INDEPENDENT_AMBULATORY_CARE_PROVIDER_SITE_OTHER): Payer: Medicare Other | Admitting: Family Medicine

## 2020-06-02 ENCOUNTER — Other Ambulatory Visit: Payer: Medicare Other

## 2020-06-02 ENCOUNTER — Encounter: Payer: Self-pay | Admitting: Family Medicine

## 2020-06-02 ENCOUNTER — Other Ambulatory Visit: Payer: Self-pay

## 2020-06-02 VITALS — BP 120/70 | HR 75 | Temp 98.1°F | Ht 63.0 in | Wt 176.9 lb

## 2020-06-02 DIAGNOSIS — J189 Pneumonia, unspecified organism: Secondary | ICD-10-CM

## 2020-06-02 DIAGNOSIS — I10 Essential (primary) hypertension: Secondary | ICD-10-CM

## 2020-06-02 DIAGNOSIS — J449 Chronic obstructive pulmonary disease, unspecified: Secondary | ICD-10-CM | POA: Diagnosis not present

## 2020-06-02 NOTE — Progress Notes (Signed)
Established Patient Office Visit  Subjective:  Patient ID: Martin Navarro, male    DOB: 10-24-1950  Age: 70 y.o. MRN: [id number]  CC:  Chief Complaint  Patient presents with  . Follow-up    Follow up for chest x ray     HPI Martin Navarro presents for follow-up regarding multifocal pneumonia from COVID.  Refer to previous note for details He has a very long history of smoking and probable COPD.  He was admitted January 29 through the February 2.  He received IV remdesivir and steroids.  Is done well since discharge.  O2 sats have been ranging generally 95 to 99%.  He states he has had increased appetite since quitting smoking several weeks ago.  He has gained back 4 pounds.  He feels back close to baseline at this time.  He had multifocal pneumonia on recent chest x-ray.  His cough has resolved.  We have discussed follow-up x-ray but actually we realize she is scheduled for low-dose CT lung cancer screening end of this month.  He denies any hemoptysis.  He has evidence for probable COPD by previous imaging.  No recent pulmonary function test.  Generally very well tolerant of activities.  Does use albuterol inhaler sporadically  He has hypertension treated with losartan 50 mg daily.  No recent dizziness.  No headaches.  No chest pains.  Past Medical History:  Diagnosis Date  . Acute prostatitis 09/21/2016  . AKI (acute kidney injury) ([place])   . Asthma   . Chest pain   . COPD (chronic obstructive pulmonary disease) ([place])   . Dyspnea   . Emphysema of lung ([place])   . Epididymoorchitis 01/28/2017  . Erectile dysfunction   . Family history of ischemic heart disease and other diseases of the circulatory system   . GERD (gastroesophageal reflux disease) 10/25/2010  . History of leukocytosis   . Hyperlipidemia   . 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 Hypokalemia   . Nausea   . Nicotine abuse 10/25/2010  . Prostatitis   . Sepsis ([place]) 09/21/2016    Past Surgical History:  Procedure Laterality  Date  . SKIN TAG REMOVAL  11/2019    Family History  Problem Relation Age of Onset  . Hypertension Father   . Heart disease Father 4       CABG    Social History   Socioeconomic History  . Marital status: Divorced    Spouse name: Not on file  . Number of children: Not on file  . Years of education: Not on file  . Highest education level: Not on file  Occupational History  . Not on file  Tobacco Use  . Smoking status: Current Every Day Smoker    Packs/day: 2.00    Years: 53.00    Pack years: 106.00    Types: E-cigarettes    Last attempt to quit: 02/07/2015    Years since quitting: 5.3  . Smokeless tobacco: Current User  . Tobacco comment: Pt now vapes, quit tobacco 2016  Vaping Use  . Vaping Use: Every day  Substance and Sexual Activity  . Alcohol use: No  . Drug use: Not Currently  . Sexual activity: Yes  Other Topics Concern  . Not on file  Social History Narrative  . Not on file   Social Determinants of Health   Financial Resource Strain: Not on file  Food Insecurity: Not on file  Transportation Needs: Not on file  Physical Activity: Not on file  Stress:  Not on file  Social Connections: Not on file  Intimate Partner Violence: Not on file    Outpatient Medications Prior to Visit  Medication Sig Dispense Refill  . albuterol (VENTOLIN HFA) 108 (90 Base) MCG/ACT inhaler Inhale 2 puffs into the lungs every 6 (six) hours as needed for wheezing or shortness of breath. 6.7 g 0  . amoxicillin (AMOXIL) 875 MG tablet Take 1 tablet (875 mg total) by mouth 2 (two) times daily. 14 tablet 0  . aspirin 81 MG tablet Take 81 mg by mouth daily.    Marland Kitchen ELDERBERRY PO Take 1 tablet by mouth daily.    Marland Kitchen guaiFENesin-codeine 100-10 MG/5ML syrup Take 5 mLs by mouth every 6 (six) hours as needed for cough.    . losartan (COZAAR) 50 MG tablet TAKE 1 TABLET(50 MG) BY MOUTH DAILY (Patient taking differently: Take 50 mg by mouth daily.) 90 tablet 1  . Omega-3 Fatty Acids (FISH OIL)  1200 MG CAPS Take 1,200 mg by mouth daily.    Marland Kitchen omeprazole (PRILOSEC) 40 MG capsule TAKE 1 CAPSULE BY MOUTH EVERY DAY 90 capsule 1  . oxymetazoline (AFRIN) 0.05 % nasal spray Place 2 sprays 2 (two) times daily as needed into both nostrils for congestion.    . rosuvastatin (CRESTOR) 10 MG tablet TAKE 1 TABLET(10 MG) BY MOUTH DAILY (Patient taking differently: Take 10 mg by mouth daily.) 90 tablet 1  . vitamin B-12 (CYANOCOBALAMIN) 1000 MCG tablet Take 1,000-5,000 mcg by mouth daily.     . vitamin C (ASCORBIC ACID) 500 MG tablet Take 500 mg by mouth daily.    . vitamin E 400 UNIT capsule Take 1,200 Units by mouth daily.     No facility-administered medications prior to visit.    Allergies  Allergen Reactions  . Sulfa Antibiotics     Unknown    ROS Review of Systems  Constitutional: Positive for appetite change. Negative for unexpected weight change.  Respiratory: Negative for cough and shortness of breath.   Cardiovascular: Negative for chest pain and leg swelling.  Gastrointestinal: Negative for abdominal pain.  Genitourinary: Negative for dysuria.      Objective:    Physical Exam Vitals reviewed.  Constitutional:      Appearance: Normal appearance.  Cardiovascular:     Rate and Rhythm: Normal rate and regular rhythm.  Pulmonary:     Effort: Pulmonary effort is normal.     Breath sounds: Normal breath sounds. No wheezing or rales.  Musculoskeletal:     Right lower leg: No edema.     Left lower leg: No edema.  Neurological:     Mental Status: He is alert.     BP 120/70 (BP Location: Left Arm, Patient Position: Sitting, Cuff Size: Normal)   Pulse 75   Temp 98.1 F (36.7 C) (Oral)   Ht 5\' 3"  (1.6 m)   Wt 176 lb 14.4 oz (80.2 kg)   SpO2 98%   BMI 31.34 kg/m  Wt Readings from Last 3 Encounters:  06/02/20 176 lb 14.4 oz (80.2 kg)  05/05/20 172 lb (78 kg)  04/24/20 176 lb 5.9 oz (80 kg)     There are no preventive care reminders to display for this  patient.  There are no preventive care reminders to display for this patient.  Lab Results  Component Value Date   TSH 1.60 12/24/2019   Lab Results  Component Value Date   WBC 9.7 05/05/2020   HGB 13.0 05/05/2020   HCT 38.4 (L) 05/05/2020  MCV 92.3 05/05/2020   PLT 280.0 05/05/2020   Lab Results  Component Value Date   NA 141 05/05/2020   K 4.2 05/05/2020   CO2 28 05/05/2020   GLUCOSE 107 (H) 05/05/2020   BUN 7 05/05/2020   CREATININE 0.78 05/05/2020   BILITOT 0.8 05/05/2020   ALKPHOS 69 05/05/2020   AST 14 05/05/2020   ALT 16 05/05/2020   PROT 5.9 (L) 05/05/2020   ALBUMIN 3.2 (L) 05/05/2020   CALCIUM 8.6 05/05/2020   ANIONGAP 10 04/28/2020   GFR 90.10 05/05/2020   Lab Results  Component Value Date   CHOL 135 12/24/2019   Lab Results  Component Value Date   HDL 43 12/24/2019   Lab Results  Component Value Date   LDLCALC 74 12/24/2019   Lab Results  Component Value Date   TRIG 234 (H) 04/24/2020   Lab Results  Component Value Date   CHOLHDL 3.1 12/24/2019   Lab Results  Component Value Date   HGBA1C 6.0 (H) 02/06/2020      Assessment & Plan:   #1 recent COVID-19 infection with multifocal pneumonia.  Clinically improved with improved O2 sats and also symptomatically improved.  We had initially scheduled follow-up to get repeat chest x-ray but he actually has low-dose CT lung cancer screening scheduled in a couple of weeks and we will defer to that since he is doing better clinically  #2 hypertension stable and at goal -Continue losartan 50 mg daily  #3 probable COPD.  He has over 100-pack-year history of smoking.  We did discuss possible PFTs but he declines at this time.    No orders of the defined types were placed in this encounter.   Follow-up: Return in about 6 months (around 12/03/2020).    Carolann Littler, MD

## 2020-06-02 NOTE — Patient Instructions (Signed)
Follow up for any cough or recurrent fever  We will call will X-ray results.

## 2020-06-07 DIAGNOSIS — M25521 Pain in right elbow: Secondary | ICD-10-CM | POA: Diagnosis not present

## 2020-06-21 ENCOUNTER — Ambulatory Visit (INDEPENDENT_AMBULATORY_CARE_PROVIDER_SITE_OTHER)
Admission: RE | Admit: 2020-06-21 | Discharge: 2020-06-21 | Disposition: A | Discharge status: Home / Self Care | Payer: Medicare Other | Source: Ambulatory Visit | Attending: Acute Care | Admitting: Acute Care

## 2020-06-21 ENCOUNTER — Other Ambulatory Visit: Payer: Self-pay

## 2020-06-21 DIAGNOSIS — Z87891 Personal history of nicotine dependence: Secondary | ICD-10-CM | POA: Diagnosis not present

## 2020-06-21 DIAGNOSIS — F17210 Nicotine dependence, cigarettes, uncomplicated: Secondary | ICD-10-CM

## 2020-06-22 ENCOUNTER — Telehealth: Payer: Self-pay | Admitting: Internal Medicine

## 2020-06-22 NOTE — Telephone Encounter (Signed)
History of tubular adenoma 2010 Colonoscopy with Dr. Christoper Fabian in October 2016 --hemorrhoids with perianal skin tags, moderate left-sided diverticulosis.  5-year recall recommended  Okay for surveillance colonoscopy at this time

## 2020-06-22 NOTE — Telephone Encounter (Signed)
Hey Dr. Hilarie Fredrickson, This patient will like to establish care with you for his next colonoscopy. He had one in 2016. We have received his records. We will send them to you for review. Please advise on scheduling.

## 2020-06-24 ENCOUNTER — Other Ambulatory Visit: Payer: Self-pay | Admitting: Family Medicine

## 2020-06-24 NOTE — Telephone Encounter (Signed)
Spoke with daughter. Stated she will call back tomorrow 06/25/20 to schedule

## 2020-07-06 ENCOUNTER — Encounter: Payer: Self-pay | Admitting: Internal Medicine

## 2020-07-07 ENCOUNTER — Telehealth: Payer: Self-pay | Admitting: Acute Care

## 2020-07-07 DIAGNOSIS — J8410 Pulmonary fibrosis, unspecified: Secondary | ICD-10-CM

## 2020-07-07 DIAGNOSIS — Z87891 Personal history of nicotine dependence: Secondary | ICD-10-CM

## 2020-07-07 DIAGNOSIS — F17210 Nicotine dependence, cigarettes, uncomplicated: Secondary | ICD-10-CM

## 2020-07-07 NOTE — Telephone Encounter (Signed)
I have called the patient's daughter [name] and reviewed the scan results with her . Lung RADS 2: nodules that are benign in appearance and behavior with a very low likelihood of becoming a clinically active cancer due to size or lack of growth. Recommendation per radiology is for a repeat LDCT in 12 months. Please schedule for 12 month follow up, and fax results to PCP.  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 Langley Gauss, please refer to Pulmonary Dr. Vaughan Browner or [place] . Daughter would like him seen by Pulmonary to follow the fibrosis and to make sure it dose not progress post Covid.   Thanks so much

## 2020-07-07 NOTE — Telephone Encounter (Signed)
Martin Navarro had his LCS CT done on 06/21/20.  Daughter [name] has called me twice trying to get his CT results.  She states that no one has called her yet.  [name]'s phone number is [phone]

## 2020-07-08 NOTE — Telephone Encounter (Signed)
See other telephone note 07/07/20

## 2020-07-08 NOTE — Telephone Encounter (Signed)
CT results faxed to PCP via Epic. Order placed for 12 mth f/u low dose ct. Order also placed for Pulmonary consult with either Dr Vaughan Browner or Dr Chase Caller. Nothing further needed.

## 2020-07-16 DIAGNOSIS — H10013 Acute follicular conjunctivitis, bilateral: Secondary | ICD-10-CM | POA: Diagnosis not present

## 2020-07-16 DIAGNOSIS — T1511XA Foreign body in conjunctival sac, right eye, initial encounter: Secondary | ICD-10-CM | POA: Diagnosis not present

## 2020-08-01 NOTE — Progress Notes (Signed)
Cardiology Office Note:    Date:  08/03/2020   ID:  Martin Navarro, DOB 02/02/1951, MRN [id number]  PCP:  [name], MD  Cardiologist:  Sinclair Grooms, MD   Referring MD: [name], MD   Chief Complaint  Patient presents with  . Coronary Artery Disease    History of Present Illness:    Martin Navarro is a 70 y.o. male with a hx of chest pain, asthma,  prediabetes, aortic atherosclerosis by CT and family h/o CAD (Prior Dr.WS Wynonia Lawman patient).  He had COVID in January.  He developed hypoxia.  He was previously vaccinated and then received his second shot.  He developed significant hypoxia but otherwise was not very ill.  He did have to receive monoclonal antibody and steroid therapy during his 5-day hospital stay.  Good result of the infection is that he stopped vaping.  He downside from stopping vaping is that he has been eating more and gaining weight.  During the stress of COVID and hypoxia, he had no cardiac symptoms.  His EKG was normal.  Cardiac biomarkers were unremarkable during the hospital stay.  Past Medical History:  Diagnosis Date  . Acute prostatitis 09/21/2016  . AKI (acute kidney injury) ([place])   . Asthma   . Chest pain   . COPD (chronic obstructive pulmonary disease) ([place])   . Dyspnea   . Emphysema of lung ([place])   . Epididymoorchitis 01/28/2017  . Erectile dysfunction   . Family history of ischemic heart disease and other diseases of the circulatory system   . GERD (gastroesophageal reflux disease) 10/25/2010  . History of leukocytosis   . Hyperlipidemia   . 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 Hypokalemia   . Nausea   . Nicotine abuse 10/25/2010  . Prostatitis   . Sepsis ([place]) 09/21/2016    Past Surgical History:  Procedure Laterality Date  . SKIN TAG REMOVAL  11/2019    Current Medications: Current Meds  Medication Sig  . aspirin 81 MG tablet Take 81 mg by mouth daily.  . Celecoxib (CELEBREX PO) Take by mouth daily as needed. Pt unsure  of dose  . ELDERBERRY PO Take 1 tablet by mouth daily.  Marland Kitchen losartan (COZAAR) 50 MG tablet TAKE 1 TABLET(50 MG) BY MOUTH DAILY  . Omega-3 Fatty Acids (FISH OIL) 1200 MG CAPS Take 1,200 mg by mouth daily.  Marland Kitchen omeprazole (PRILOSEC) 40 MG capsule TAKE 1 CAPSULE BY MOUTH EVERY DAY  . oxymetazoline (AFRIN) 0.05 % nasal spray Place 2 sprays 2 (two) times daily as needed into both nostrils for congestion.  . rosuvastatin (CRESTOR) 10 MG tablet TAKE 1 TABLET(10 MG) BY MOUTH DAILY (Patient taking differently: Take 10 mg by mouth daily.)  . vitamin B-12 (CYANOCOBALAMIN) 1000 MCG tablet Take 1,000-5,000 mcg by mouth daily.   . vitamin C (ASCORBIC ACID) 500 MG tablet Take 500 mg by mouth daily.  . vitamin E 400 UNIT capsule Take 1,200 Units by mouth daily.     Allergies:   Sulfa antibiotics   Social History   Socioeconomic History  . Marital status: Divorced    Spouse name: Not on file  . Number of children: Not on file  . Years of education: Not on file  . Highest education level: Not on file  Occupational History  . Not on file  Tobacco Use  . Smoking status: Current Every Day Smoker    Packs/day: 2.00    Years: 53.00    Pack years:  106.00    Types: E-cigarettes    Last attempt to quit: 02/07/2015    Years since quitting: 5.4  . Smokeless tobacco: Current User  . Tobacco comment: Pt now vapes, quit tobacco 2016  Vaping Use  . Vaping Use: Every day  Substance and Sexual Activity  . Alcohol use: No  . Drug use: Not Currently  . Sexual activity: Yes  Other Topics Concern  . Not on file  Social History Narrative  . Not on file   Social Determinants of Health   Financial Resource Strain: Not on file  Food Insecurity: Not on file  Transportation Needs: Not on file  Physical Activity: Not on file  Stress: Not on file  Social Connections: Not on file     Family History: The patient's family history includes Heart disease (age of onset: 19) in his father; Hypertension in his  father.  ROS:   Please see the history of present illness.    Hard of hearing.  Gaining weight.  Smoking cessation has occurred.  Plans to get the COVID booster.  He needs to wait at least 4 months before doing so.  All other systems reviewed and are negative.  EKGs/Labs/Other Studies Reviewed:    The following studies were reviewed today: Doppler echocardiogram February 2022  IMPRESSIONS  1. Left ventricular ejection fraction, by estimation, is 60 to 65%. The  left ventricle has normal function. The left ventricle has no regional  wall motion abnormalities. Left ventricular diastolic parameters were  normal.  2. Right ventricular systolic function is normal. The right ventricular  size is normal. Tricuspid regurgitation signal is inadequate for assessing  PA pressure.  3. The mitral valve is normal in structure. Trivial mitral valve  regurgitation.  4. The aortic valve is normal in structure. Aortic valve regurgitation is  trivial.  5. The inferior vena cava is dilated in size with >50% respiratory  variability, suggesting right atrial pressure of 8 mmHg.   Conclusion(s)/Recommendation(s): No vegetation seen but technically  difficult study with poor visualization of valves. If clinical suspicion  for endocarditis, would recommend TEE.   EKG:  EKG performed April 26, 2020, rhythm normal sinus, with no abnormality.  Recent Labs: 12/24/2019: TSH 1.60 04/28/2020: B Natriuretic Peptide 55.0; Magnesium 2.0 05/05/2020: ALT 16; BUN 7; Creatinine, Ser 0.78; Hemoglobin 13.0; Platelets 280.0; Potassium 4.2; Sodium 141  Recent Lipid Panel    Component Value Date/Time   CHOL 135 12/24/2019 0941   TRIG 234 (H) 04/24/2020 1435   HDL 43 12/24/2019 0941   CHOLHDL 3.1 12/24/2019 0941   VLDL 23.0 12/23/2018 1027   LDLCALC 74 12/24/2019 0941    Physical Exam:    VS:  BP 132/86   Pulse (!) 58   Ht 5\' 5"  (1.651 m)   Wt 185 lb (83.9 kg)   SpO2 99%   BMI 30.79 kg/m     Wt  Readings from Last 3 Encounters:  08/03/20 185 lb (83.9 kg)  06/02/20 176 lb 14.4 oz (80.2 kg)  05/05/20 172 lb (78 kg)     GEN: Slightly overweight with BMI 31. No acute distress HEENT: Normal NECK: No JVD. LYMPHATICS: No lymphadenopathy CARDIAC: 1/6 right upper sternal systolic murmur. RRR no gallop, or edema. VASCULAR:  Normal Pulses. No bruits. RESPIRATORY:  Clear to auscultation without rales, wheezing or rhonchi  ABDOMEN: Soft, non-tender, non-distended, No pulsatile mass, MUSCULOSKELETAL: No deformity  SKIN: Warm and dry NEUROLOGIC:  Alert and oriented x 3 PSYCHIATRIC:  Normal affect  ASSESSMENT:    1. Atypical chest pain   2. CAD in native artery   3. Hyperlipidemia, unspecified hyperlipidemia type    PLAN:    In order of problems listed above:  1. Had no chest pain or cardiac concerns during recent COVID infection.  Echocardiogram demonstrated totally normal LV function and RV function without evidence of myocarditis, decreased LV function or pericardial disease. 2. He has asymptomatic coronary atherosclerosis.  Aggressive risk factor modification we discussed. 3. Target LDL less than 70.  May need to increase rosuvastatin to 20 mg/day if LDL remains above 70.  Overall education and awareness concerning primary risk prevention was discussed in detail: LDL less than 70, hemoglobin A1c less than 7, blood pressure target less than 130/80 mmHg, >150 minutes of moderate aerobic activity per week, avoidance of smoking, weight control (via diet and exercise), and continued surveillance/management of/for obstructive sleep apnea.    Medication Adjustments/Labs and Tests Ordered: Current medicines are reviewed at length with the patient today.  Concerns regarding medicines are outlined above.  No orders of the defined types were placed in this encounter.  No orders of the defined types were placed in this encounter.   There are no Patient Instructions on file for this  visit.   Signed, Sinclair Grooms, MD  08/03/2020 8:54 AM    [place]

## 2020-08-03 ENCOUNTER — Encounter: Payer: Self-pay | Admitting: Interventional Cardiology

## 2020-08-03 ENCOUNTER — Other Ambulatory Visit: Payer: Self-pay

## 2020-08-03 ENCOUNTER — Ambulatory Visit: Payer: Medicare Other | Admitting: Interventional Cardiology

## 2020-08-03 VITALS — BP 132/86 | HR 58 | Ht 65.0 in | Wt 185.0 lb

## 2020-08-03 DIAGNOSIS — E785 Hyperlipidemia, unspecified: Secondary | ICD-10-CM

## 2020-08-03 DIAGNOSIS — R0789 Other chest pain: Secondary | ICD-10-CM

## 2020-08-03 DIAGNOSIS — I2510 Atherosclerotic heart disease of native coronary artery without angina pectoris: Secondary | ICD-10-CM | POA: Diagnosis not present

## 2020-08-03 NOTE — Patient Instructions (Signed)

## 2020-08-06 ENCOUNTER — Telehealth: Payer: Self-pay | Admitting: Family Medicine

## 2020-08-06 NOTE — Telephone Encounter (Signed)
Spoke with patient daughter [name] to  schedule Medicare Annual Wellness Visit (AWV) either virtually or in office.  She was on another line and will call back to schedule   AWV-I PER [place] 05/26/15  please schedule at anytime with [facility] Nurse Health Advisor 1 or 2   This should be a 45 minute visit.

## 2020-08-16 ENCOUNTER — Other Ambulatory Visit: Payer: Self-pay

## 2020-08-16 ENCOUNTER — Ambulatory Visit (INDEPENDENT_AMBULATORY_CARE_PROVIDER_SITE_OTHER): Payer: Medicare Other | Admitting: Family Medicine

## 2020-08-16 ENCOUNTER — Encounter: Payer: Self-pay | Admitting: Family Medicine

## 2020-08-16 VITALS — BP 130/80 | HR 70 | Temp 98.0°F | Wt 185.6 lb

## 2020-08-16 DIAGNOSIS — Z23 Encounter for immunization: Secondary | ICD-10-CM

## 2020-08-16 DIAGNOSIS — L03114 Cellulitis of left upper limb: Secondary | ICD-10-CM | POA: Diagnosis not present

## 2020-08-16 DIAGNOSIS — S50852A Superficial foreign body of left forearm, initial encounter: Secondary | ICD-10-CM

## 2020-08-16 MED ORDER — DOXYCYCLINE HYCLATE 100 MG PO TABS: 100.0000 mg | ORAL_TABLET | Freq: Two times a day (BID) | ORAL | 0 refills | Status: DC

## 2020-08-16 NOTE — Progress Notes (Addendum)
Established Patient Office Visit  Subjective:  Patient ID: Martin Navarro, male    DOB: [birth year]  Age: 70 y.o. MRN: [id number]  CC:  Chief Complaint  Patient presents with  . Open Wound    X 1 week, started out as a small bump and is now roughly the size of a quarter.     HPI Martin Navarro presents for wound left forearm.  About a week ago he first noted what it look like a "bug bite ".  He had a small erythematous papule and over the past week this is enlarged in size.  He has not seen any drainage.  He has had some surrounding erythema and soreness.  No fevers or chills.  No prior history of known MRSA.  Last tetanus was about 10 years ago  Past Medical History:  Diagnosis Date  . Acute prostatitis 09/21/2016  . AKI (acute kidney injury) ([place])   . Asthma   . Chest pain   . COPD (chronic obstructive pulmonary disease) ([place])   . Dyspnea   . Emphysema of lung ([place])   . Epididymoorchitis 01/28/2017  . Erectile dysfunction   . Family history of ischemic heart disease and other diseases of the circulatory system   . GERD (gastroesophageal reflux disease) 10/25/2010  . History of leukocytosis   . Hyperlipidemia   . 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 Hypokalemia   . Nausea   . Nicotine abuse 10/25/2010  . Prostatitis   . Sepsis ([place]) 09/21/2016    Past Surgical History:  Procedure Laterality Date  . SKIN TAG REMOVAL  11/2019    Family History  Problem Relation Age of Onset  . Hypertension Father   . Heart disease Father 73       CABG    Social History   Socioeconomic History  . Marital status: Divorced    Spouse name: Not on file  . Number of children: Not on file  . Years of education: Not on file  . Highest education level: Not on file  Occupational History  . Not on file  Tobacco Use  . Smoking status: Current Every Day Smoker    Packs/day: 2.00    Years: 53.00    Pack years: 106.00    Types: E-cigarettes    Last attempt to quit: 02/07/2015    Years since  quitting: 5.5  . Smokeless tobacco: Current User  . Tobacco comment: Pt now vapes, quit tobacco 2016  Vaping Use  . Vaping Use: Every day  Substance and Sexual Activity  . Alcohol use: No  . Drug use: Not Currently  . Sexual activity: Yes  Other Topics Concern  . Not on file  Social History Narrative  . Not on file   Social Determinants of Health   Financial Resource Strain: Not on file  Food Insecurity: Not on file  Transportation Needs: Not on file  Physical Activity: Not on file  Stress: Not on file  Social Connections: Not on file  Intimate Partner Violence: Not on file    Outpatient Medications Prior to Visit  Medication Sig Dispense Refill  . aspirin 81 MG tablet Take 81 mg by mouth daily.    . Celecoxib (CELEBREX PO) Take by mouth daily as needed. Pt unsure of dose    . ELDERBERRY PO Take 1 tablet by mouth daily.    Marland Kitchen losartan (COZAAR) 50 MG tablet TAKE 1 TABLET(50 MG) BY MOUTH DAILY 90 tablet 1  . Omega-3 Fatty Acids (FISH  OIL) 1200 MG CAPS Take 1,200 mg by mouth daily.    Marland Kitchen omeprazole (PRILOSEC) 40 MG capsule TAKE 1 CAPSULE BY MOUTH EVERY DAY 90 capsule 1  . oxymetazoline (AFRIN) 0.05 % nasal spray Place 2 sprays 2 (two) times daily as needed into both nostrils for congestion.    . rosuvastatin (CRESTOR) 10 MG tablet TAKE 1 TABLET(10 MG) BY MOUTH DAILY (Patient taking differently: Take 10 mg by mouth daily.) 90 tablet 1  . vitamin B-12 (CYANOCOBALAMIN) 1000 MCG tablet Take 1,000-5,000 mcg by mouth daily.     . vitamin C (ASCORBIC ACID) 500 MG tablet Take 500 mg by mouth daily.    . vitamin E 400 UNIT capsule Take 1,200 Units by mouth daily.     No facility-administered medications prior to visit.    Allergies  Allergen Reactions  . Sulfa Antibiotics     Unknown    ROS Review of Systems  Constitutional: Negative for chills and fever.      Objective:    Physical Exam Vitals reviewed.  Constitutional:      Appearance: Normal appearance.   Cardiovascular:     Rate and Rhythm: Normal rate and regular rhythm.  Pulmonary:     Effort: Pulmonary effort is normal.     Breath sounds: Normal breath sounds.  Skin:    Comments: Left forearm along the volar surface about midway down reveals approximately 1.5 x 1 cm area of erythema somewhat elongated with some fluctuance and minimal pustulosis near the surface.  Slightly tender to palpation.  Has some mild surrounding erythema  Neurological:     Mental Status: He is alert.     BP 130/80 (BP Location: Left Arm, Patient Position: Sitting, Cuff Size: Normal)   Pulse 70   Temp 98 F (36.7 C) (Oral)   Wt 185 lb 9.6 oz (84.2 kg)   SpO2 97%   BMI 30.89 kg/m  Wt Readings from Last 3 Encounters:  08/16/20 185 lb 9.6 oz (84.2 kg)  08/03/20 185 lb (83.9 kg)  06/02/20 176 lb 14.4 oz (80.2 kg)     Health Maintenance Due  Topic Date Due  . COVID-19 Vaccine (3 - Booster for Pfizer series) 05/24/2020    There are no preventive care reminders to display for this patient.  Lab Results  Component Value Date   TSH 1.60 12/24/2019   Lab Results  Component Value Date   WBC 9.7 05/05/2020   HGB 13.0 05/05/2020   HCT 38.4 (L) 05/05/2020   MCV 92.3 05/05/2020   PLT 280.0 05/05/2020   Lab Results  Component Value Date   NA 141 05/05/2020   K 4.2 05/05/2020   CO2 28 05/05/2020   GLUCOSE 107 (H) 05/05/2020   BUN 7 05/05/2020   CREATININE 0.78 05/05/2020   BILITOT 0.8 05/05/2020   ALKPHOS 69 05/05/2020   AST 14 05/05/2020   ALT 16 05/05/2020   PROT 5.9 (L) 05/05/2020   ALBUMIN 3.2 (L) 05/05/2020   CALCIUM 8.6 05/05/2020   ANIONGAP 10 04/28/2020   GFR 90.10 05/05/2020   Lab Results  Component Value Date   CHOL 135 12/24/2019   Lab Results  Component Value Date   HDL 43 12/24/2019   Lab Results  Component Value Date   LDLCALC 74 12/24/2019   Lab Results  Component Value Date   TRIG 234 (H) 04/24/2020   Lab Results  Component Value Date   CHOLHDL 3.1 12/24/2019    Lab Results  Component Value Date  HGBA1C 6.0 (H) 02/06/2020      Assessment & Plan:   Patient presented with wound left forearm.  Initially we suspected possible abscess and therefore recommended incision and drainage.  We discussed risk including bleeding and low risk of scarring and patient consented.  Prepped skin with Betadine.  Using 1% plain Xylocaine local infiltration of the skin was accomplished.  Using #11 blade made approximately 1 cm incision over area of fluctuance.  Minimal purulent drainage.  Minimal bleeding.  After opening wound cavity we saw elongated structure and removed basically a 1 cm wood splinter.  No other foreign bodies noted.  We then packed the wound cavity with quarter inch iodoform gauze.  There was minimal bleeding.  Patient tolerated well.  Vaseline and outer dressing applied.  In addition to the foreign body left forearm there does appear to be some mild secondary infection with mild surrounding cellulitis changes which are addressed as below  -Tetanus booster was given -Leave dressing dry for 24 hours then take off and clean daily with soap and water and continue topical Vaseline.  He will pull packing out tomorrow before cleaning with soap and water. -He had some very mild cellulitis changes so we elected to go and cover with doxycycline 100 mg twice daily for 7 days -Follow-up promptly for any progressive redness, fever, or other concerns.  Suspect this will start to heal rapidly with splinter fragment removed  Addendum 08-17-20: Incision was made down to subcutaneous tissue.  Meds ordered this encounter  Medications  . doxycycline (VIBRA-TABS) 100 MG tablet    Sig: Take 1 tablet (100 mg total) by mouth 2 (two) times daily.    Dispense:  14 tablet    Refill:  0    Follow-up: No follow-ups on file.    Carolann Littler, MD

## 2020-08-16 NOTE — Patient Instructions (Signed)
Keep dry for 24 hours and then pull packing tomorrow and clean daily with soap and water  Apply vaseline over wound daily and keep covered until healed.

## 2020-09-29 ENCOUNTER — Institutional Professional Consult (permissible substitution): Payer: Medicare Other | Admitting: Internal Medicine

## 2020-10-07 ENCOUNTER — Other Ambulatory Visit: Payer: Self-pay | Admitting: Family Medicine

## 2020-10-19 ENCOUNTER — Encounter: Payer: Medicare Other | Admitting: Internal Medicine

## 2020-10-20 DIAGNOSIS — Z1283 Encounter for screening for malignant neoplasm of skin: Secondary | ICD-10-CM | POA: Diagnosis not present

## 2020-10-20 DIAGNOSIS — L905 Scar conditions and fibrosis of skin: Secondary | ICD-10-CM | POA: Diagnosis not present

## 2020-10-20 DIAGNOSIS — D225 Melanocytic nevi of trunk: Secondary | ICD-10-CM | POA: Diagnosis not present

## 2020-10-25 ENCOUNTER — Ambulatory Visit: Payer: Medicare Other | Admitting: Pulmonary Disease

## 2020-10-25 ENCOUNTER — Other Ambulatory Visit: Payer: Self-pay

## 2020-10-25 ENCOUNTER — Encounter: Payer: Self-pay | Admitting: Pulmonary Disease

## 2020-10-25 VITALS — BP 120/64 | HR 60 | Ht 65.0 in | Wt 187.6 lb

## 2020-10-25 DIAGNOSIS — J8410 Pulmonary fibrosis, unspecified: Secondary | ICD-10-CM

## 2020-10-25 NOTE — Patient Instructions (Signed)
I have reviewed your scans and imaging which shows some mild scarring and inflammation which could be from your prior COVID In addition you have emphysema from prior smoking Overall I am glad that you are improving and back to normal  We will get a high-res CT and PFTs in 3 months and follow-up in clinic after

## 2020-10-25 NOTE — Progress Notes (Signed)
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 patient was hospitalized for 5 days in January for COVID infection.  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  Patient reports occasional smoker's cough but denies any shortness of breath, dyspnea on exertion, chills, fevers, wheezing or chest pain.  Patient states he is back to his baseline.   Pets: He has had a cat for 4-5 years. 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 Report working in [employer] with his father at a young age. Exposures: No mold, hot or feather pillows or comforters Smoking history: Smoked for 50 years with 50-pack-year hx. Quit 5 years ago, then vaped for 5 years before quitting this year.  Travel history: No significant travel history.  Relevant family history: No significant family hx.    Outpatient Encounter Medications as of 10/25/2020  Medication Sig   aspirin 81 MG tablet Take 81 mg by mouth daily.   Celecoxib (CELEBREX PO) Take by mouth daily as needed. Pt unsure of dose   doxycycline (VIBRA-TABS) 100 MG tablet Take 1 tablet (100 mg total) by mouth 2 (two) times daily.   ELDERBERRY PO Take 1 tablet by mouth daily.   losartan (COZAAR) 50 MG tablet TAKE 1 TABLET(50 MG) BY MOUTH DAILY   Omega-3 Fatty Acids (FISH OIL) 1200 MG CAPS Take 1,200 mg by mouth daily.   omeprazole (PRILOSEC) 40 MG capsule TAKE 1 CAPSULE BY MOUTH EVERY DAY   oxymetazoline (AFRIN) 0.05 % nasal  spray Place 2 sprays 2 (two) times daily as needed into both nostrils for congestion.   rosuvastatin (CRESTOR) 10 MG tablet TAKE 1 TABLET(10 MG) BY MOUTH DAILY   vitamin B-12 (CYANOCOBALAMIN) 1000 MCG tablet Take 1,000-5,000 mcg by mouth daily.    vitamin C (ASCORBIC ACID) 500 MG tablet Take 500 mg by mouth daily.   vitamin E 400 UNIT capsule Take 1,200 Units by mouth daily.   No facility-administered encounter medications on file as of 10/25/2020.    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   Hypokalemia    Nausea    Nicotine abuse 10/25/2010   Prostatitis    Sepsis ([place]) 09/21/2016    Past Surgical History:  Procedure Laterality Date   SKIN TAG REMOVAL  11/2019    Family History  Problem Relation Age of Onset  Hypertension Father    Heart disease Father 50       CABG    Social History   Socioeconomic History   Marital status: Divorced    Spouse name: Not on file   Number of children: Not on file   Years of education: Not on file   Highest education level: Not on file  Occupational History   Not on file  Tobacco Use   Smoking status: Every Day    Packs/day: 2.00    Years: 53.00    Pack years: 106.00    Types: E-cigarettes, Cigarettes    Last attempt to quit: 02/07/2015    Years since quitting: 5.7   Smokeless tobacco: Current   Tobacco comments:    Pt now vapes, quit tobacco 2016  Vaping Use    Vaping Use: Every day  Substance and Sexual Activity   Alcohol use: No   Drug use: Not Currently   Sexual activity: Yes  Other Topics Concern   Not on file  Social History Narrative   Not on file   Social Determinants of Health   Financial Resource Strain: Not on file  Food Insecurity: Not on file  Transportation Needs: Not on file  Physical Activity: Not on file  Stress: Not on file  Social Connections: Not on file  Intimate Partner Violence: Not on file    Review of systems: Review of Systems  Constitutional: Negative for fever and chills.  HENT: Positive for occasional dry cough Eyes: Negative for blurred vision.  Respiratory: as per HPI  Cardiovascular: Negative for chest pain and palpitations.  Gastrointestinal: Negative for vomiting, diarrhea, blood per rectum. Genitourinary: Negative for dysuria, urgency, frequency and hematuria.  Musculoskeletal: Negative for myalgias, back pain and joint pain.  Skin: Negative for itching and rash.   Neurological: Negative for dizziness, tremors, focal weakness, seizures and loss of consciousness.  Endo/Heme/Allergies: Negative for environmental allergies.  Psychiatric/Behavioral: Negative for depression, suicidal ideas and hallucinations.  All other systems reviewed and are negative.  Physical Exam: There were no vitals taken for this visit. Gen:      No acute distress HEENT:  EOMI, sclera anicteric Neck:     No masses; no thyromegaly Lungs:    Clear to auscultation bilaterally; normal respiratory effort.  Decreased air movement at the bases. No wheezing, rhonchi or rales CV:         Regular rate and rhythm; no murmurs Abd:      + bowel sounds; soft, non-tender; no palpable masses, no distension Ext:    No edema; adequate peripheral perfusion Skin:      Warm and dry. Diffuse hyperpigmentation of both upper and lower extremities. Neuro: alert and oriented x 3 Psych: normal mood and affect  Data Reviewed: Imaging: CXR  05/05/2020: Multifocal peripheral and bibasilar pulmonary infiltrate 2/2 COVID-19 pneumonia CT chest lung cancer screening 06/21/2020: New quarters peripheral groundglass opacity with traction bronchiectasis and emphysematous changes.   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 No SOB at baseline. No need for inhalers at this point. Needs a PFT to formally assess lung function. Will need regular follow up to monitor progression of lung changes.   Plan/Recommendations: Schedule  for PFT Continue yearly lung cancer screening  This appointment required 25 minutes of patient care (this includes precharting, chart review, review of results, face-to-face care, etc.).  Marshell Garfinkel MD [facility] 10/25/2020, 8:50 AM  CC: Magdalen Spatz, NP

## 2020-10-27 ENCOUNTER — Encounter: Payer: Self-pay | Admitting: Pulmonary Disease

## 2020-11-02 ENCOUNTER — Telehealth: Payer: Self-pay

## 2020-11-02 NOTE — Telephone Encounter (Signed)
John,  Will you please look at this patient's ECHO report and provide feedback on clearance for [facility]. Thank you

## 2020-11-03 DIAGNOSIS — L570 Actinic keratosis: Secondary | ICD-10-CM | POA: Diagnosis not present

## 2020-11-03 DIAGNOSIS — X32XXXD Exposure to sunlight, subsequent encounter: Secondary | ICD-10-CM | POA: Diagnosis not present

## 2020-11-03 NOTE — Telephone Encounter (Signed)
Noted on patient PV chart

## 2020-11-08 ENCOUNTER — Ambulatory Visit: Payer: Medicare Other

## 2020-11-22 ENCOUNTER — Encounter: Payer: Medicare Other | Admitting: Internal Medicine

## 2020-11-24 ENCOUNTER — Encounter: Payer: Medicare Other | Admitting: Internal Medicine

## 2020-12-14 DIAGNOSIS — Z20822 Contact with and (suspected) exposure to covid-19: Secondary | ICD-10-CM | POA: Diagnosis not present

## 2020-12-21 ENCOUNTER — Other Ambulatory Visit: Payer: Self-pay | Admitting: Family Medicine

## 2020-12-24 ENCOUNTER — Encounter: Payer: Medicare Other | Admitting: Family Medicine

## 2021-01-03 ENCOUNTER — Ambulatory Visit (INDEPENDENT_AMBULATORY_CARE_PROVIDER_SITE_OTHER): Payer: Medicare Other | Admitting: Family Medicine

## 2021-01-03 ENCOUNTER — Other Ambulatory Visit: Payer: Self-pay

## 2021-01-03 VITALS — BP 138/80 | HR 67 | Temp 97.9°F | Ht 65.0 in | Wt 192.5 lb

## 2021-01-03 DIAGNOSIS — I700 Atherosclerosis of aorta: Secondary | ICD-10-CM | POA: Insufficient documentation

## 2021-01-03 DIAGNOSIS — Z0000 Encounter for general adult medical examination without abnormal findings: Secondary | ICD-10-CM | POA: Diagnosis not present

## 2021-01-03 LAB — HEPATIC FUNCTION PANEL
ALT: 16 U/L (ref 0–53)
AST: 17 U/L (ref 0–37)
Albumin: 4.1 g/dL (ref 3.5–5.2)
Alkaline Phosphatase: 61 U/L (ref 39–117)
Bilirubin, Direct: 0.1 mg/dL (ref 0.0–0.3)
Total Bilirubin: 0.6 mg/dL (ref 0.2–1.2)
Total Protein: 6.6 g/dL (ref 6.0–8.3)

## 2021-01-03 LAB — BASIC METABOLIC PANEL
BUN: 12 mg/dL (ref 6–23)
CO2: 28 mEq/L (ref 19–32)
Calcium: 9.1 mg/dL (ref 8.4–10.5)
Chloride: 106 mEq/L (ref 96–112)
Creatinine, Ser: 0.87 mg/dL (ref 0.40–1.50)
GFR: 86.77 mL/min (ref >60.00)
Glucose, Bld: 107 mg/dL — ABNORMAL HIGH (ref 70–99)
Potassium: 4.8 mEq/L (ref 3.5–5.1)
Sodium: 140 mEq/L (ref 135–145)

## 2021-01-03 LAB — CBC WITH DIFFERENTIAL/PLATELET
Basophils Absolute: 0.1 10*3/uL (ref 0.0–0.1)
Basophils Relative: 0.5 % (ref 0.0–3.0)
Eosinophils Absolute: 0.3 10*3/uL (ref 0.0–0.7)
Eosinophils Relative: 2.3 % (ref 0.0–5.0)
HCT: 42.3 % (ref 39.0–52.0)
Hemoglobin: 14.1 g/dL (ref 13.0–17.0)
Lymphocytes Relative: 18.9 % (ref 12.0–46.0)
Lymphs Abs: 2.1 10*3/uL (ref 0.7–4.0)
MCHC: 33.3 g/dL (ref 30.0–36.0)
MCV: 92.4 fl (ref 78.0–100.0)
Monocytes Absolute: 0.9 10*3/uL (ref 0.1–1.0)
Monocytes Relative: 8.3 % (ref 3.0–12.0)
Neutro Abs: 7.9 10*3/uL — ABNORMAL HIGH (ref 1.4–7.7)
Neutrophils Relative %: 70 % (ref 43.0–77.0)
Platelets: 212 10*3/uL (ref 150.0–400.0)
RBC: 4.58 Mil/uL (ref 4.22–5.81)
RDW: 14 % (ref 11.5–15.5)
WBC: 11.2 10*3/uL — ABNORMAL HIGH (ref 4.0–10.5)

## 2021-01-03 LAB — LIPID PANEL
Cholesterol: 139 mg/dL (ref 0–200)
HDL: 38.8 mg/dL — ABNORMAL LOW (ref >39.00)
NonHDL: 100.13
Total CHOL/HDL Ratio: 4
Triglycerides: 239 mg/dL — ABNORMAL HIGH (ref 0.0–149.0)
VLDL: 47.8 mg/dL — ABNORMAL HIGH (ref 0.0–40.0)

## 2021-01-03 LAB — TSH: TSH: 2.4 u[IU]/mL (ref 0.35–5.50)

## 2021-01-03 LAB — PSA: PSA: 0.52 ng/mL (ref 0.10–4.00)

## 2021-01-03 LAB — LDL CHOLESTEROL, DIRECT: Direct LDL: 76 mg/dL

## 2021-01-03 NOTE — Progress Notes (Signed)
Established Patient Office Visit  Subjective:  Patient ID: PELLEGRINO Navarro, male    DOB: [birth year]  Age: 70 y.o. MRN: [id number]  CC:  Chief Complaint  Patient presents with   Annual Exam    HPI Martin Navarro presents for physical exam.  He has medical problems including hypertension, hyperlipidemia, long history of smoking, aortic atherosclerosis by evidence for calcification on previous low-dose CT lung cancer screening. Remains quite active.  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  Health maintenance reviewed  -He declines flu vaccine.  He wishes to discuss this with his daughter.  No history of shingles vaccine.  Colonoscopy due 2026.  Tetanus due 2032.  Previous hepatitis C screen negative.  Social history-divorced.  1 daughter.  Long history of smoking.  Still use some marijuana.  No regular alcohol use.  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   Hypokalemia    Nausea    Nicotine abuse 10/25/2010   Prostatitis    Sepsis ([place]) 09/21/2016    Past Surgical History:  Procedure Laterality Date   SKIN TAG REMOVAL  11/2019    Family History  Problem Relation Age of Onset   Hypertension Father    Heart disease Father 81       CABG    Social History   Socioeconomic History   Marital status: Divorced    Spouse name: Not on file   Number of children: Not on file   Years of education: Not on file   Highest  education level: Not on file  Occupational History   Not on file  Tobacco Use   Smoking status: Former    Packs/day: 2.00    Years: 53.00    Pack years: 106.00    Types: E-cigarettes, Cigarettes    Quit date: 04/26/2020    Years since quitting: 0.6   Smokeless tobacco: Former   Tobacco comments:    Stopped tobacco and vape 03/2020  Vaping Use   Vaping Use: Every day  Substance and Sexual Activity   Alcohol use: No   Drug use: Not Currently   Sexual activity: Yes  Other Topics Concern   Not on file  Social History Narrative   Not on file   Social Determinants of Health   Financial Resource Strain: Not on file  Food Insecurity: Not on file  Transportation Needs: Not on file  Physical Activity: Not on file  Stress: Not on file  Social Connections: Not on file  Intimate Partner Violence: Not on file    Outpatient Medications Prior to Visit  Medication Sig Dispense Refill   aspirin 81 MG tablet Take 81 mg by mouth daily.     Celecoxib (CELEBREX PO) Take by mouth daily as needed. Pt unsure of dose     ELDERBERRY PO Take 1 tablet by mouth daily.     losartan (COZAAR) 50  MG tablet TAKE 1 TABLET(50 MG) BY MOUTH DAILY 90 tablet 1   Omega-3 Fatty Acids (FISH OIL) 1200 MG CAPS Take 1,200 mg by mouth daily.     omeprazole (PRILOSEC) 40 MG capsule TAKE 1 CAPSULE BY MOUTH EVERY DAY 90 capsule 1   oxymetazoline (AFRIN) 0.05 % nasal spray Place 2 sprays into both nostrils 2 (two) times daily as needed for congestion.     rosuvastatin (CRESTOR) 10 MG tablet TAKE 1 TABLET(10 MG) BY MOUTH DAILY 90 tablet 1   vitamin B-12 (CYANOCOBALAMIN) 1000 MCG tablet Take 1,000-5,000 mcg by mouth daily.      vitamin C (ASCORBIC ACID) 500 MG tablet Take 500 mg by mouth daily.     vitamin E 400 UNIT capsule Take 1,200 Units by mouth daily.     No facility-administered medications prior to visit.    Allergies  Allergen Reactions   Sulfa Antibiotics     Unknown    ROS Review of Systems   Constitutional:  Negative for activity change, appetite change, fatigue and fever.  HENT:  Negative for congestion, ear pain and trouble swallowing.   Eyes:  Negative for pain and visual disturbance.  Respiratory:  Negative for cough, shortness of breath and wheezing.   Cardiovascular:  Negative for chest pain and palpitations.  Gastrointestinal:  Negative for abdominal distention, abdominal pain, blood in stool, constipation, diarrhea, nausea, rectal pain and vomiting.  Endocrine: Negative for polydipsia and polyuria.  Genitourinary:  Negative for dysuria, hematuria and testicular pain.  Musculoskeletal:  Negative for arthralgias and joint swelling.  Skin:  Negative for rash.  Neurological:  Negative for dizziness, syncope and headaches.  Hematological:  Negative for adenopathy.  Psychiatric/Behavioral:  Negative for confusion and dysphoric mood.      Objective:    Physical Exam Constitutional:      General: He is not in acute distress.    Appearance: He is well-developed.  HENT:     Head: Normocephalic and atraumatic.     Right Ear: External ear normal.     Left Ear: External ear normal.  Eyes:     Conjunctiva/sclera: Conjunctivae normal.     Pupils: Pupils are equal, round, and reactive to light.  Neck:     Thyroid: No thyromegaly.  Cardiovascular:     Rate and Rhythm: Normal rate and regular rhythm.     Heart sounds: Normal heart sounds. No murmur heard. Pulmonary:     Effort: No respiratory distress.     Breath sounds: No wheezing or rales.  Abdominal:     General: Bowel sounds are normal. There is no distension.     Palpations: Abdomen is soft. There is no mass.     Tenderness: There is no abdominal tenderness. There is no guarding or rebound.  Musculoskeletal:     Cervical back: Normal range of motion and neck supple.     Right lower leg: No edema.     Left lower leg: No edema.  Lymphadenopathy:     Cervical: No cervical adenopathy.  Skin:    Findings: No rash.      Comments: Skin is very dry especially upper extremities.  Neurological:     Mental Status: He is alert and oriented to person, place, and time.     Cranial Nerves: No cranial nerve deficit.     Deep Tendon Reflexes: Reflexes normal.    BP 138/80 (BP Location: Left Arm, Patient Position: Sitting, Cuff Size: Normal)   Pulse 67   Temp 97.9  F (36.6 C) (Oral)   Ht 5\' 5"  (1.651 m)   Wt 192 lb 8 oz (87.3 kg)   SpO2 100%   BMI 32.03 kg/m  Wt Readings from Last 3 Encounters:  01/03/21 192 lb 8 oz (87.3 kg)  10/25/20 187 lb 9.6 oz (85.1 kg)  08/16/20 185 lb 9.6 oz (84.2 kg)     Health Maintenance Due  Topic Date Due   Zoster Vaccines- Shingrix (1 of 2) Never done   COVID-19 Vaccine (3 - Booster for Pfizer series) 05/24/2020    There are no preventive care reminders to display for this patient.  Lab Results  Component Value Date   TSH 1.60 12/24/2019   Lab Results  Component Value Date   WBC 9.7 05/05/2020   HGB 13.0 05/05/2020   HCT 38.4 (L) 05/05/2020   MCV 92.3 05/05/2020   PLT 280.0 05/05/2020   Lab Results  Component Value Date   NA 141 05/05/2020   K 4.2 05/05/2020   CO2 28 05/05/2020   GLUCOSE 107 (H) 05/05/2020   BUN 7 05/05/2020   CREATININE 0.78 05/05/2020   BILITOT 0.8 05/05/2020   ALKPHOS 69 05/05/2020   AST 14 05/05/2020   ALT 16 05/05/2020   PROT 5.9 (L) 05/05/2020   ALBUMIN 3.2 (L) 05/05/2020   CALCIUM 8.6 05/05/2020   ANIONGAP 10 04/28/2020   GFR 90.10 05/05/2020   Lab Results  Component Value Date   CHOL 135 12/24/2019   Lab Results  Component Value Date   HDL 43 12/24/2019   Lab Results  Component Value Date   LDLCALC 74 12/24/2019   Lab Results  Component Value Date   TRIG 234 (H) 04/24/2020   Lab Results  Component Value Date   CHOLHDL 3.1 12/24/2019   Lab Results  Component Value Date   HGBA1C 6.0 (H) 02/06/2020      Assessment & Plan:   Problem List Items Addressed This Visit       Unprioritized   Aortic  atherosclerosis ([place])   Relevant Orders   Lipid panel   Other Visit Diagnoses     Physical exam    -  Primary   Relevant Orders   Basic metabolic panel   Lipid panel   CBC with Differential/Platelet   TSH   Hepatic function panel   PSA     -Flu vaccine recommended.  He declines at this time.  He wishes to discuss with his daughter.  -Recommend he consider Shingrix.  He will also discuss this with his daughter.  He has had prior Zostavax 2012 and I explained that Shingrix is more efficacious.  -Obtain screening labs as above  -Continue with annual low-dose CT lung cancer screening  -We did address his atherosclerosis noted on previous scanning.  He is already on statin.  No recent chest pains.  Discussed importance of good blood pressure and lipid control.  He has no history of diabetes.  No orders of the defined types were placed in this encounter.   Follow-up: No follow-ups on file.    Carolann Littler, MD

## 2021-01-03 NOTE — Patient Instructions (Signed)
Consider flu vaccine  Also consider the newer shingles vaccine (Shingrix)

## 2021-01-05 ENCOUNTER — Encounter: Payer: Self-pay | Admitting: Family Medicine

## 2021-01-06 DIAGNOSIS — M25511 Pain in right shoulder: Secondary | ICD-10-CM | POA: Diagnosis not present

## 2021-01-06 DIAGNOSIS — M25512 Pain in left shoulder: Secondary | ICD-10-CM | POA: Diagnosis not present

## 2021-02-03 ENCOUNTER — Other Ambulatory Visit: Payer: Medicare Other

## 2021-02-18 DIAGNOSIS — J019 Acute sinusitis, unspecified: Secondary | ICD-10-CM | POA: Diagnosis not present

## 2021-02-22 ENCOUNTER — Telehealth: Payer: Self-pay | Admitting: Family Medicine

## 2021-02-22 NOTE — Chronic Care Management (AMB) (Signed)
  Chronic Care Management   Outreach Note  02/22/2021 Name: Martin Navarro MRN: [id number] DOB: [birth year]  Referred by: [name], MD Reason for referral : No chief complaint on file.   An unsuccessful telephone outreach was attempted today. The patient was referred to the pharmacist for assistance with care management and care coordination.   Follow Up Plan:   Tatjana Dellinger Upstream Scheduler

## 2021-03-01 ENCOUNTER — Ambulatory Visit (INDEPENDENT_AMBULATORY_CARE_PROVIDER_SITE_OTHER)
Admission: RE | Admit: 2021-03-01 | Discharge: 2021-03-01 | Disposition: A | Discharge status: Home / Self Care | Payer: Medicare Other | Source: Ambulatory Visit | Attending: Pulmonary Disease | Admitting: Pulmonary Disease

## 2021-03-01 ENCOUNTER — Other Ambulatory Visit: Payer: Self-pay

## 2021-03-01 ENCOUNTER — Telehealth: Payer: Self-pay | Admitting: Family Medicine

## 2021-03-01 DIAGNOSIS — J8410 Pulmonary fibrosis, unspecified: Secondary | ICD-10-CM

## 2021-03-01 DIAGNOSIS — J432 Centrilobular emphysema: Secondary | ICD-10-CM | POA: Diagnosis not present

## 2021-03-01 DIAGNOSIS — I2510 Atherosclerotic heart disease of native coronary artery without angina pectoris: Secondary | ICD-10-CM | POA: Diagnosis not present

## 2021-03-01 DIAGNOSIS — J929 Pleural plaque without asbestos: Secondary | ICD-10-CM | POA: Diagnosis not present

## 2021-03-01 DIAGNOSIS — I700 Atherosclerosis of aorta: Secondary | ICD-10-CM | POA: Diagnosis not present

## 2021-03-01 NOTE — Progress Notes (Signed)
  Chronic Care Management   Outreach Note  03/01/2021 Name: Martin Navarro MRN: [id number] DOB: 1951/01/15  Referred by: [name], MD Reason for referral : No chief complaint on file.   A second unsuccessful telephone outreach was attempted today. The patient was referred to pharmacist for assistance with care management and care coordination.  Follow Up Plan:   Tatjana Dellinger Upstream Scheduler

## 2021-03-03 ENCOUNTER — Inpatient Hospital Stay: Admission: RE | Admit: 2021-03-03 | Payer: Medicare Other | Source: Ambulatory Visit

## 2021-03-07 ENCOUNTER — Ambulatory Visit (INDEPENDENT_AMBULATORY_CARE_PROVIDER_SITE_OTHER): Payer: Medicare Other | Admitting: Pulmonary Disease

## 2021-03-07 ENCOUNTER — Ambulatory Visit: Payer: Medicare Other | Admitting: Pulmonary Disease

## 2021-03-07 ENCOUNTER — Other Ambulatory Visit: Payer: Self-pay

## 2021-03-07 ENCOUNTER — Other Ambulatory Visit: Payer: Self-pay | Admitting: *Deleted

## 2021-03-07 ENCOUNTER — Encounter: Payer: Self-pay | Admitting: Pulmonary Disease

## 2021-03-07 VITALS — BP 134/80 | HR 71 | Temp 98.3°F | Ht 64.0 in | Wt 190.0 lb

## 2021-03-07 DIAGNOSIS — Z87891 Personal history of nicotine dependence: Secondary | ICD-10-CM

## 2021-03-07 DIAGNOSIS — J8410 Pulmonary fibrosis, unspecified: Secondary | ICD-10-CM

## 2021-03-07 DIAGNOSIS — F17210 Nicotine dependence, cigarettes, uncomplicated: Secondary | ICD-10-CM

## 2021-03-07 DIAGNOSIS — J449 Chronic obstructive pulmonary disease, unspecified: Secondary | ICD-10-CM

## 2021-03-07 LAB — PULMONARY FUNCTION TEST
DL/VA % pred: 67 %
DL/VA: 2.79 ml/min/mmHg/L
DLCO cor % pred: 62 %
DLCO cor: 13.37 ml/min/mmHg
DLCO unc % pred: 62 %
DLCO unc: 13.37 ml/min/mmHg
FEF 25-75 Post: 0.8 L/sec
FEF 25-75 Pre: 1.14 L/sec
FEF2575-%Change-Post: -29 %
FEF2575-%Pred-Post: 42 %
FEF2575-%Pred-Pre: 60 %
FEV1-%Change-Post: -6 %
FEV1-%Pred-Post: 82 %
FEV1-%Pred-Pre: 88 %
FEV1-Post: 2.05 L
FEV1-Pre: 2.19 L
FEV1FVC-%Change-Post: -2 %
FEV1FVC-%Pred-Pre: 83 %
FEV6-%Change-Post: -5 %
FEV6-%Pred-Post: 103 %
FEV6-%Pred-Pre: 109 %
FEV6-Post: 3.3 L
FEV6-Pre: 3.5 L
FEV6FVC-%Change-Post: -1 %
FEV6FVC-%Pred-Post: 103 %
FEV6FVC-%Pred-Pre: 105 %
FVC-%Change-Post: -4 %
FVC-%Pred-Post: 99 %
FVC-%Pred-Pre: 104 %
FVC-Post: 3.42 L
FVC-Pre: 3.57 L
Post FEV1/FVC ratio: 60 %
Post FEV6/FVC ratio: 97 %
Pre FEV1/FVC ratio: 61 %
Pre FEV6/FVC Ratio: 98 %
RV % pred: 174 %
RV: 3.74 L
TLC % pred: 129 %
TLC: 7.52 L

## 2021-03-07 MED ORDER — TRELEGY ELLIPTA 100-62.5-25 MCG/ACT IN AEPB: 1.0000 | INHALATION_SPRAY | Freq: Every day | RESPIRATORY_TRACT | 0 refills | Status: DC

## 2021-03-07 NOTE — Progress Notes (Signed)
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 patient was hospitalized for 5 days in January for COVID infection.  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  Patient reports occasional smoker's cough but denies any shortness of breath, dyspnea on exertion, chills, fevers, wheezing or chest pain.  Patient states he is back to his baseline.   Pets: He has had a cat for 4-5 years. 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 Report working in [employer] with his father at a young age. Exposures: No mold, hot or feather pillows or comforters Smoking history: Smoked for 50 years with 50-pack-year hx. Quit in 2017, then vaped for 5 years before quitting in 2022 Travel history: No significant travel history.  Relevant family history: No significant family hx.   Interim history: Here for review of CT and PFTs. Continues to have mild dyspnea on exertion, cough with congestion which is unchanged from baseline  Outpatient Encounter Medications as of 03/07/2021  Medication Sig   aspirin 81 MG tablet Take 81 mg by mouth daily.   Celecoxib (CELEBREX PO) Take by mouth daily as needed. Pt unsure of dose   ELDERBERRY PO Take 1 tablet by mouth daily.   losartan (COZAAR) 50 MG tablet TAKE 1 TABLET(50 MG) BY MOUTH DAILY   Omega-3 Fatty Acids (FISH OIL) 1200 MG CAPS Take 1,200 mg by mouth daily.   omeprazole (PRILOSEC) 40 MG capsule TAKE 1 CAPSULE BY  MOUTH EVERY DAY   oxymetazoline (AFRIN) 0.05 % nasal spray Place 2 sprays into both nostrils 2 (two) times daily as needed for congestion.   rosuvastatin (CRESTOR) 10 MG tablet TAKE 1 TABLET(10 MG) BY MOUTH DAILY   vitamin B-12 (CYANOCOBALAMIN) 1000 MCG tablet Take 1,000-5,000 mcg by mouth daily.    vitamin C (ASCORBIC ACID) 500 MG tablet Take 500 mg by mouth daily.   vitamin E 400 UNIT capsule Take 1,200 Units by mouth daily.   No facility-administered encounter medications on file as of 03/07/2021.   Physical Exam: Blood pressure 134/80, pulse 71, temperature 98.3 F (36.8 C), temperature source Oral, height 5\' 4"  (1.626 m), weight 190 lb (86.2 kg), SpO2 97 %. Gen:      No acute distress HEENT:  EOMI, sclera anicteric Neck:     No masses; no thyromegaly Lungs:    Clear to auscultation bilaterally; normal respiratory effort CV:         Regular rate and rhythm; no murmurs Abd:      + bowel sounds; soft, non-tender; no palpable masses, no distension Ext:    No edema; adequate peripheral perfusion Skin:      Warm and dry; no rash Neuro: alert and oriented x 3 Psych: normal mood and affect  Data Reviewed: Imaging: CXR 05/05/2020: Multifocal peripheral and bibasilar pulmonary infiltrate 2/2 COVID-19 pneumonia CT chest lung cancer screening 06/21/2020: New quarters peripheral groundglass opacity with traction bronchiectasis and emphysematous changes.  High-res CT 03/01/2021-improvement in groundglass opacities, emphysema, bronchial wall thickening,  coronary artery disease.  Have reviewed the images personally.  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 Next scan to be scheduled for December 2023 as a 1 year follow-up from high-res CT this month  Plan/Recommendations: Trial Trelegy. Weight loss  Marshell Garfinkel MD [facility] 03/07/2021, 10:14 AM  CC: [name], MD

## 2021-03-07 NOTE — Addendum Note (Signed)
Addended by: Elton Sin on: 03/07/2021 11:47 AM   Modules accepted: Orders

## 2021-03-07 NOTE — Patient Instructions (Signed)
I have reviewed his CT scan which does not show any residual changes from COVID-19 The CT scan and lung function does show mild COPD We will start you on an inhaler called Trelegy Work on weight loss with diet and exercise  Return to clinic in 3 months

## 2021-03-07 NOTE — Progress Notes (Signed)
PFT done today. 

## 2021-03-08 ENCOUNTER — Telehealth: Payer: Self-pay | Admitting: Family Medicine

## 2021-03-08 NOTE — Telephone Encounter (Signed)
Spoke with patients daughter [name]) schedule Medicare Annual Wellness Visit (AWV) either virtually or in office. Left  my Herbie Drape number [phone]   She stated she will call back to schedule  AWV-I PER [place] 05/26/15  please schedule at anytime with [facility] Nurse Health Advisor 1 or 2   This should be a 45 minute visit.

## 2021-03-15 ENCOUNTER — Encounter: Payer: Self-pay | Admitting: Gastroenterology

## 2021-03-29 DIAGNOSIS — G5602 Carpal tunnel syndrome, left upper limb: Secondary | ICD-10-CM | POA: Diagnosis not present

## 2021-03-31 ENCOUNTER — Encounter: Payer: Self-pay | Admitting: Pulmonary Disease

## 2021-03-31 MED ORDER — TRELEGY ELLIPTA 100-62.5-25 MCG/ACT IN AEPB: 1.0000 | INHALATION_SPRAY | Freq: Every day | RESPIRATORY_TRACT | 5 refills | Status: DC

## 2021-04-04 ENCOUNTER — Ambulatory Visit (AMBULATORY_SURGERY_CENTER): Payer: Medicare Other | Admitting: *Deleted

## 2021-04-04 ENCOUNTER — Other Ambulatory Visit: Payer: Self-pay

## 2021-04-04 VITALS — Ht 68.0 in | Wt 195.0 lb

## 2021-04-04 DIAGNOSIS — Z86010 Personal history of colonic polyps: Secondary | ICD-10-CM

## 2021-04-04 MED ORDER — NA SULFATE-K SULFATE-MG SULF 17.5-3.13-1.6 GM/177ML PO SOLN: 1.0000 | ORAL | 0 refills | Status: DC

## 2021-04-04 NOTE — Progress Notes (Signed)
Patient's pre-visit was done today over the phone with the patients daughter [name](POA) due to patient being [facility] with hearing aid. Name,DOB and address verified. Patient denies any allergies to Eggs and Soy. Patient denies any problems with anesthesia/sedation. Patient is not taking any diet pills or blood thinners. No home Oxygen.Prep instructions sent to pt's MyChart (if activated).Patient understands to call us back with any questions or concerns. Patient is aware of our care-partner policy and [facility] safety protocol.   EMMI education assigned to the patient for the procedure, sent to [place].   The patient is COVID-19 vaccinated.

## 2021-04-05 ENCOUNTER — Other Ambulatory Visit: Payer: Self-pay | Admitting: Family Medicine

## 2021-04-15 ENCOUNTER — Encounter: Payer: Self-pay | Admitting: Internal Medicine

## 2021-04-18 ENCOUNTER — Encounter: Payer: Self-pay | Admitting: Internal Medicine

## 2021-04-21 ENCOUNTER — Telehealth: Payer: Self-pay | Admitting: Internal Medicine

## 2021-04-21 ENCOUNTER — Encounter: Payer: Medicare Other | Admitting: Internal Medicine

## 2021-04-21 NOTE — Telephone Encounter (Signed)
Good Morning Dr. Hilarie Fredrickson,   I called this patient and spoke to his daughter (POA) who was going to be bringing him here, she stated she called around 3:30pm yesterday  and cancelled her appointment because she had tested positive for Covid.  He had no other person who could bring him for his procedure. I rescheduled him for May 30, 2021

## 2021-05-03 DIAGNOSIS — D224 Melanocytic nevi of scalp and neck: Secondary | ICD-10-CM | POA: Diagnosis not present

## 2021-05-03 DIAGNOSIS — L570 Actinic keratosis: Secondary | ICD-10-CM | POA: Diagnosis not present

## 2021-05-03 DIAGNOSIS — D485 Neoplasm of uncertain behavior of skin: Secondary | ICD-10-CM | POA: Diagnosis not present

## 2021-05-03 DIAGNOSIS — X32XXXD Exposure to sunlight, subsequent encounter: Secondary | ICD-10-CM | POA: Diagnosis not present

## 2021-05-16 DIAGNOSIS — M25532 Pain in left wrist: Secondary | ICD-10-CM | POA: Diagnosis not present

## 2021-05-16 DIAGNOSIS — G5602 Carpal tunnel syndrome, left upper limb: Secondary | ICD-10-CM | POA: Diagnosis not present

## 2021-05-20 DIAGNOSIS — J00 Acute nasopharyngitis [common cold]: Secondary | ICD-10-CM | POA: Diagnosis not present

## 2021-05-20 DIAGNOSIS — Z03818 Encounter for observation for suspected exposure to other biological agents ruled out: Secondary | ICD-10-CM | POA: Diagnosis not present

## 2021-05-29 ENCOUNTER — Encounter: Payer: Self-pay | Admitting: Certified Registered Nurse Anesthetist

## 2021-05-30 ENCOUNTER — Encounter: Payer: Self-pay | Admitting: Internal Medicine

## 2021-05-30 ENCOUNTER — Other Ambulatory Visit: Payer: Self-pay

## 2021-05-30 ENCOUNTER — Ambulatory Visit (AMBULATORY_SURGERY_CENTER): Payer: Medicare Other | Admitting: Internal Medicine

## 2021-05-30 VITALS — BP 133/70 | HR 68 | Temp 97.5°F | Resp 14 | Ht 64.0 in | Wt 195.0 lb

## 2021-05-30 DIAGNOSIS — D122 Benign neoplasm of ascending colon: Secondary | ICD-10-CM

## 2021-05-30 DIAGNOSIS — Z86010 Personal history of colonic polyps: Secondary | ICD-10-CM

## 2021-05-30 MED ORDER — SODIUM CHLORIDE 0.9 % IV SOLN: 500.0000 mL | Freq: Once | INTRAVENOUS | Status: DC

## 2021-05-30 NOTE — Progress Notes (Signed)
Pt's states no medical or surgical changes since previsit or office visit. 

## 2021-05-30 NOTE — Progress Notes (Signed)
1048 Ephedrine 10 mg given IV due to low BP, MD updated.   ?

## 2021-05-30 NOTE — Op Note (Signed)
[place] ?Patient Name: Martin Navarro ?Procedure Date: 05/30/2021 10:23 AM ?MRN: [id number] ?Endoscopist: Jerene Bears , MD ?Age: 71 ?Referring MD:  ?Date of Birth: [birth year] ?Gender: Male ?Account #: [id number] ?Procedure:                Colonoscopy ?Indications:              High risk colon cancer surveillance: Personal  ?                          history of non-advanced adenomas in 2010, last  ?                          colonoscopy 2016 (no polyps) ?Medicines:                Monitored Anesthesia Care ?Procedure:                Pre-Anesthesia Assessment: ?                          - Prior to the procedure, a History and Physical  ?                          was performed, and patient medications and  ?                          allergies were reviewed. The patient's tolerance of  ?                          previous anesthesia was also reviewed. The risks  ?                          and benefits of the procedure and the sedation  ?                          options and risks were discussed with the patient.  ?                          All questions were answered, and informed consent  ?                          was obtained. Prior Anticoagulants: The patient has  ?                          taken no previous anticoagulant or antiplatelet  ?                          agents. ASA Grade Assessment: III - A patient with  ?                          severe systemic disease. After reviewing the risks  ?                          and benefits, the patient was deemed in  ?  satisfactory condition to undergo the procedure. ?                          After obtaining informed consent, the colonoscope  ?                          was passed under direct vision. Throughout the  ?                          procedure, the patient's blood pressure, pulse, and  ?                          oxygen saturations were monitored continuously. The  ?                          Olympus CF-HQ190L ([id number]) Colonoscope  was  ?                          introduced through the anus and advanced to the  ?                          cecum, identified by appendiceal orifice and  ?                          ileocecal valve. The colonoscopy was performed  ?                          without difficulty. The patient tolerated the  ?                          procedure well. The quality of the bowel  ?                          preparation was excellent. The ileocecal valve,  ?                          appendiceal orifice, and rectum were photographed. ?Scope In: 10:42:49 AM ?Scope Out: 10:53:20 AM ?Scope Withdrawal Time: 0 hours 8 minutes 15 seconds  ?Total Procedure Duration: 0 hours 10 minutes 31 seconds  ?Findings:                 The digital rectal exam was normal. ?                          Two sessile polyps were found in the cecum. The  ?                          polyps were 3 to 6 mm in size. These polyps were  ?                          removed with a cold snare. Resection and retrieval  ?                          were complete. ?  A 5 mm polyp was found in the ascending colon. The  ?                          polyp was sessile. The polyp was removed with a  ?                          cold snare. Resection and retrieval were complete. ?                          Multiple small and large-mouthed diverticula were  ?                          found in the sigmoid colon and descending colon. ?                          Internal hemorrhoids were found during  ?                          retroflexion. The hemorrhoids were medium-sized. ?Complications:            No immediate complications. ?Estimated Blood Loss:     Estimated blood loss was minimal. ?Impression:               - Two 3 to 6 mm polyps in the cecum, removed with a  ?                          cold snare. Resected and retrieved. ?                          - One 5 mm polyp in the ascending colon, removed  ?                          with a cold snare. Resected and  retrieved. ?                          - Moderate diverticulosis in the sigmoid colon and  ?                          in the descending colon. ?                          - Internal hemorrhoids. ?Recommendation:           - Patient has a contact number available for  ?                          emergencies. The signs and symptoms of potential  ?                          delayed complications were discussed with the  ?                          patient. Return to normal activities tomorrow.  ?  Written discharge instructions were provided to the  ?                          patient. ?                          - Resume previous diet. ?                          - Continue present medications. ?                          - Await pathology results. ?                          - Repeat colonoscopy is recommended for  ?                          surveillance. The colonoscopy date will be  ?                          determined after pathology results from today's  ?                          exam become available for review. ?Jerene Bears, MD ?05/30/2021 10:56:43 AM ?This report has been signed electronically. ?

## 2021-05-30 NOTE — Progress Notes (Signed)
Called to room to assist during endoscopic procedure.  Patient ID and intended procedure confirmed with present staff. Received instructions for my participation in the procedure from the performing physician.  

## 2021-05-30 NOTE — Patient Instructions (Signed)
Discharge instructions given. ?Handouts on polyps,Diverticulosis and hemorrhoids. ?Resume previous medications. ?YOU HAD AN ENDOSCOPIC PROCEDURE TODAY AT [facility]:   Refer to the procedure report that was given to you for any specific questions about what was found during the examination.  If the procedure report does not answer your questions, please call your gastroenterologist to clarify.  If you requested that your care partner not be given the details of your procedure findings, then the procedure report has been included in a sealed envelope for you to review at your convenience later. ? ?YOU SHOULD EXPECT: Some feelings of bloating in the abdomen. Passage of more gas than usual.  Walking can help get rid of the air that was put into your GI tract during the procedure and reduce the bloating. If you had a lower endoscopy (such as a colonoscopy or flexible sigmoidoscopy) you may notice spotting of blood in your stool or on the toilet paper. If you underwent a bowel prep for your procedure, you may not have a normal bowel movement for a few days. ? ?Please Note:  You might notice some irritation and congestion in your nose or some drainage.  This is from the oxygen used during your procedure.  There is no need for concern and it should clear up in a day or so. ? ?SYMPTOMS TO REPORT IMMEDIATELY: ? ?Following lower endoscopy (colonoscopy or flexible sigmoidoscopy): ? Excessive amounts of blood in the stool ? Significant tenderness or worsening of abdominal pains ? Swelling of the abdomen that is new, acute ? Fever of 100?F or higher ? ? ?For urgent or emergent issues, a gastroenterologist can be reached at any hour by calling [phone]. ?Do not use MyChart messaging for urgent concerns.  ? ? ?DIET:  We do recommend a small meal at first, but then you may proceed to your regular diet.  Drink plenty of fluids but you should avoid alcoholic beverages for 24 hours. ? ?ACTIVITY:  You should  plan to take it easy for the rest of today and you should NOT DRIVE or use heavy machinery until tomorrow (because of the sedation medicines used during the test).   ? ?FOLLOW UP: ?Our staff will call the number listed on your records 48-72 hours following your procedure to check on you and address any questions or concerns that you may have regarding the information given to you following your procedure. If we do not reach you, we will leave a message.  We will attempt to reach you two times.  During this call, we will ask if you have developed any symptoms of COVID 19. If you develop any symptoms (ie: fever, flu-like symptoms, shortness of breath, cough etc.) before then, please call [phone].  If you test positive for Covid 19 in the 2 weeks post procedure, please call and report this information to [place].   ? ?If any biopsies were taken you will be contacted by phone or by letter within the next 1-3 weeks.  Please call us at [phone] if you have not heard about the biopsies in 3 weeks.  ? ? ?SIGNATURES/CONFIDENTIALITY: ?You and/or your care partner have signed paperwork which will be entered into your electronic medical record.  These signatures attest to the fact that that the information above on your After Visit Summary has been reviewed and is understood.  Full responsibility of the confidentiality of this discharge information lies with you and/or your care-partner.  ? ?

## 2021-05-30 NOTE — Progress Notes (Signed)
Report given to PACU, vss 

## 2021-05-30 NOTE — Progress Notes (Signed)
? ?GASTROENTEROLOGY PROCEDURE H&P NOTE  ? ?Primary Care Physician: ?[name], MD ? ? ? ?Reason for Procedure:  History of colon polyps ? ?Plan:    Colonoscopy ? ?Patient is appropriate for endoscopic procedure(s) in the ambulatory ([facility]) setting. ? ?The nature of the procedure, as well as the risks, benefits, and alternatives were carefully and thoroughly reviewed with the patient. Ample time for discussion and questions allowed. The patient understood, was satisfied, and agreed to proceed.  ? ? ? ?HPI: ?Martin Navarro is a 71 y.o. male who presents for colonoscopy.  Medical history as below.  Tolerated the prep.  No recent chest pain or shortness of breath.  No abdominal pain today. ? ?Past Medical History:  ?Diagnosis Date  ? Acute prostatitis 09/21/2016  ? AKI (acute kidney injury) ([place])   ? Asthma   ? Cancer [facility])   ? Chest pain   ? COPD (chronic obstructive pulmonary disease) ([place])   ? Dyspnea   ? Emphysema of lung ([place])   ? Epididymoorchitis 01/28/2017  ? Erectile dysfunction   ? Family history of ischemic heart disease and other diseases of the circulatory system   ? GERD (gastroesophageal reflux disease) 10/25/2010  ? History of leukocytosis   ? Hyperlipidemia   ? Hypertension   ? 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 Hypokalemia   ? Nausea   ? Nicotine abuse 10/25/2010  ? Prostatitis   ? Sepsis ([place]) 09/21/2016  ? Skin cancer   ? ? ?Past Surgical History:  ?Procedure Laterality Date  ? COLONOSCOPY  2010  ? 2016  ? SKIN TAG REMOVAL  11/2019  ? TONSILLECTOMY    ? ? ?Prior to Admission medications   ?Medication Sig Start Date End Date Taking? Authorizing Provider  ?aspirin 81 MG tablet Take 81 mg by mouth daily.   Yes [provider]  ?ELDERBERRY PO Take 1 tablet by mouth daily.   Yes [provider]  ?Fluticasone-Umeclidin-Vilant (TRELEGY ELLIPTA) 100-62.5-25 MCG/ACT AEPB Inhale 1 puff into the lungs daily. 03/31/21  Yes Mannam, Praveen, MD  ?losartan (COZAAR) 50 MG tablet TAKE 1  TABLET(50 MG) BY MOUTH DAILY 12/21/20  Yes Burchette, Alinda Sierras, MD  ?meloxicam (MOBIC) 15 MG tablet Take 15 mg by mouth 3 (three) times daily. 01/17/21  Yes [provider]  ?Omega-3 Fatty Acids (FISH OIL) 1200 MG CAPS Take 1,200 mg by mouth daily.   Yes [provider]  ?omeprazole (PRILOSEC) 40 MG capsule TAKE 1 CAPSULE BY MOUTH EVERY DAY 04/05/21  Yes Burchette, Alinda Sierras, MD  ?oxymetazoline (AFRIN) 0.05 % nasal spray Place 2 sprays into both nostrils 2 (two) times daily as needed for congestion.   Yes [provider]  ?rosuvastatin (CRESTOR) 10 MG tablet TAKE 1 TABLET(10 MG) BY MOUTH DAILY 04/05/21  Yes Burchette, Alinda Sierras, MD  ?vitamin B-12 (CYANOCOBALAMIN) 1000 MCG tablet Take 1,000-5,000 mcg by mouth daily.    Yes [provider]  ?vitamin C (ASCORBIC ACID) 500 MG tablet Take 500 mg by mouth daily.   Yes [provider]  ?vitamin E 400 UNIT capsule Take 1,200 Units by mouth daily.   Yes [provider]  ? ? ?Current Outpatient Medications  ?Medication Sig Dispense Refill  ? aspirin 81 MG tablet Take 81 mg by mouth daily.    ? ELDERBERRY PO Take 1 tablet by mouth daily.    ? Fluticasone-Umeclidin-Vilant (TRELEGY ELLIPTA) 100-62.5-25 MCG/ACT AEPB Inhale 1 puff into the lungs daily. 1 each 5  ? losartan (COZAAR)  50 MG tablet TAKE 1 TABLET(50 MG) BY MOUTH DAILY 90 tablet 1  ? meloxicam (MOBIC) 15 MG tablet Take 15 mg by mouth 3 (three) times daily.    ? Omega-3 Fatty Acids (FISH OIL) 1200 MG CAPS Take 1,200 mg by mouth daily.    ? omeprazole (PRILOSEC) 40 MG capsule TAKE 1 CAPSULE BY MOUTH EVERY DAY 90 capsule 1  ? oxymetazoline (AFRIN) 0.05 % nasal spray Place 2 sprays into both nostrils 2 (two) times daily as needed for congestion.    ? rosuvastatin (CRESTOR) 10 MG tablet TAKE 1 TABLET(10 MG) BY MOUTH DAILY 90 tablet 1  ? vitamin B-12 (CYANOCOBALAMIN) 1000 MCG tablet Take 1,000-5,000 mcg by mouth daily.     ? vitamin C (ASCORBIC ACID) 500 MG tablet Take 500 mg by  mouth daily.    ? vitamin E 400 UNIT capsule Take 1,200 Units by mouth daily.    ? ?Current Facility-Administered Medications  ?Medication Dose Route Frequency Provider Last Rate Last Admin  ? 0.9 %  sodium chloride infusion  500 mL Intravenous Once Ramey Schiff, Lajuan Lines, MD      ? ? ?Allergies as of 05/30/2021 - Review Complete 05/30/2021  ?Allergen Reaction Noted  ? Sulfa antibiotics  04/24/2020  ? ? ?Family History  ?Problem Relation Age of Onset  ? Hypertension Father   ? Heart disease Father 75  ?     CABG  ? Colon cancer Neg Hx   ? Esophageal cancer Neg Hx   ? Rectal cancer Neg Hx   ? Stomach cancer Neg Hx   ? ? ?Social History  ? ?Socioeconomic History  ? Marital status: Divorced  ?  Spouse name: Not on file  ? Number of children: Not on file  ? Years of education: Not on file  ? Highest education level: Not on file  ?Occupational History  ? Not on file  ?Tobacco Use  ? Smoking status: Some Days  ?  Packs/day: 2.00  ?  Years: 53.00  ?  Pack years: 106.00  ?  Types: E-cigarettes, Cigarettes  ?  Last attempt to quit: 04/26/2020  ?  Years since quitting: 1.0  ? Smokeless tobacco: Former  ? Tobacco comments:  ?  Stopped tobacco and vape 03/2020  ?Vaping Use  ? Vaping Use: Every day  ?Substance and Sexual Activity  ? Alcohol use: No  ? Drug use: Not Currently  ? Sexual activity: Yes  ?Other Topics Concern  ? Not on file  ?Social History Narrative  ? Not on file  ? ?Social Determinants of Health  ? ?Financial Resource Strain: Not on file  ?Food Insecurity: Not on file  ?Transportation Needs: Not on file  ?Physical Activity: Not on file  ?Stress: Not on file  ?Social Connections: Not on file  ?Intimate Partner Violence: Not on file  ? ? ?Physical Exam: ?Vital signs in last 24 hours: ?'@BP'$  137/90   Pulse 81   Temp (!) 97.5 ?F (36.4 ?C) (Temporal)   Resp 12   Ht '5\' 4"'$  (1.626 m)   Wt 195 lb (88.5 kg)   SpO2 99%   BMI 33.47 kg/m?  ?GEN: NAD ?EYE: Sclerae anicteric ?ENT: MMM ?CV: Non-tachycardic ?Pulm: CTA b/l ?GI: Soft,  NT/ND ?NEURO:  Alert & Oriented x 3 ? ? ?Zenovia Jarred, MD ?[facility] ? ?05/30/2021 10:32 AM ? ?

## 2021-06-01 ENCOUNTER — Telehealth: Payer: Self-pay

## 2021-06-01 NOTE — Telephone Encounter (Signed)
?  Follow up Call- ? ?Call back number 05/30/2021  ?Post procedure Call Back phone  # [phone]  ?Permission to leave phone message Yes  ?Some recent data might be hidden  ?  ? ?Patient questions: ? ?Do you have a fever, pain , or abdominal swelling? No. ?Pain Score  0 * ? ?Have you tolerated food without any problems? Yes.   ? ?Have you been able to return to your normal activities? Yes.   ? ?Do you have any questions about your discharge instructions: ?Diet   No. ?Medications  No. ?Follow up visit  No. ? ?Do you have questions or concerns about your Care? No. ? ?Actions: ?* If pain score is 4 or above: ?No action needed, pain <4. ? ?Have you developed a fever since your procedure? no ? ?2.   Have you had an respiratory symptoms (SOB or cough) since your procedure? no ? ?3.   Have you tested positive for COVID 19 since your procedure no ? ?4.   Have you had any family members/close contacts diagnosed with the COVID 19 since your procedure?  no ? ? ?If yes to any of these questions please route to Joylene John, RN and Joella Prince, RN ? ? ? ?

## 2021-06-02 ENCOUNTER — Encounter: Payer: Self-pay | Admitting: Internal Medicine

## 2021-06-09 ENCOUNTER — Telehealth: Payer: Self-pay | Admitting: Family Medicine

## 2021-06-09 NOTE — Chronic Care Management (AMB) (Signed)
?  Chronic Care Management  ? ?Note ? ?06/09/2021 ?Name: Martin Navarro MRN: [id number] DOB: 1950-07-27 ? ?Martin Navarro is a 71 y.o. year old male who is a primary care patient of Burchette, Alinda Sierras, MD. I reached out to [place] [facility] by phone today in response to a referral sent by Martin Navarro PCP, [name], MD.  ? ?[name] was given information about Chronic Care Management services today including:  ?CCM service includes personalized support from designated clinical staff supervised by his physician, including individualized plan of care and coordination with other care providers ?24/7 contact phone numbers for assistance for urgent and routine care needs. ?Service will only be billed when office clinical staff spend 20 minutes or more in a month to coordinate care. ?Only one practitioner may furnish and bill the service in a calendar month. ?The patient may stop CCM services at any time (effective at the end of the month) by phone call to the office staff. ? ? ?Patient wishes to consider information provided and/or speak with a member of the care team before deciding about enrollment in care management services.  ? ?Follow up plan: [place] ? ? ?Tatjana Dellinger ?Upstream Scheduler  ?

## 2021-06-19 ENCOUNTER — Other Ambulatory Visit: Payer: Self-pay | Admitting: Family Medicine

## 2021-06-24 ENCOUNTER — Telehealth: Payer: Self-pay | Admitting: Family Medicine

## 2021-06-24 NOTE — Telephone Encounter (Signed)
Left message for patient to call back and schedule Medicare Annual Wellness Visit (AWV) either virtually or in office. Left  my jabber number [phone]   AWV-I PER [place] 05/26/15  please schedule at anytime with [facility] Nurse Health Advisor 1 or 2   This should be a 45 minute visit.  

## 2021-07-18 ENCOUNTER — Encounter: Payer: Self-pay | Admitting: Pulmonary Disease

## 2021-07-18 ENCOUNTER — Ambulatory Visit: Payer: Medicare Other | Admitting: Pulmonary Disease

## 2021-07-18 VITALS — BP 120/80 | HR 56 | Temp 97.7°F | Ht 65.0 in | Wt 196.2 lb

## 2021-07-18 DIAGNOSIS — J449 Chronic obstructive pulmonary disease, unspecified: Secondary | ICD-10-CM

## 2021-07-18 LAB — CBC WITH DIFFERENTIAL/PLATELET
Basophils Absolute: 0.1 10*3/uL (ref 0.0–0.1)
Basophils Relative: 1.2 % (ref 0.0–3.0)
Eosinophils Absolute: 0.3 10*3/uL (ref 0.0–0.7)
Eosinophils Relative: 3.1 % (ref 0.0–5.0)
HCT: 41.3 % (ref 39.0–52.0)
Hemoglobin: 13.8 g/dL (ref 13.0–17.0)
Lymphocytes Relative: 22.3 % (ref 12.0–46.0)
Lymphs Abs: 2.4 10*3/uL (ref 0.7–4.0)
MCHC: 33.5 g/dL (ref 30.0–36.0)
MCV: 94.6 fl (ref 78.0–100.0)
Monocytes Absolute: 1.2 10*3/uL — ABNORMAL HIGH (ref 0.1–1.0)
Monocytes Relative: 11.1 % (ref 3.0–12.0)
Neutro Abs: 6.7 10*3/uL (ref 1.4–7.7)
Neutrophils Relative %: 62.3 % (ref 43.0–77.0)
Platelets: 212 10*3/uL (ref 150.0–400.0)
RBC: 4.36 Mil/uL (ref 4.22–5.81)
RDW: 13.4 % (ref 11.5–15.5)
WBC: 10.7 10*3/uL — ABNORMAL HIGH (ref 4.0–10.5)

## 2021-07-18 MED ORDER — ANORO ELLIPTA 62.5-25 MCG/ACT IN AEPB: 1.0000 | INHALATION_SPRAY | Freq: Every day | RESPIRATORY_TRACT | 2 refills | Status: DC

## 2021-07-18 NOTE — Patient Instructions (Signed)
I am glad you are doing better with your breathing with the inhaler ?We will check CBC differential, IgE and alpha-1 antitrypsin levels and phenotype today ?Based on the results we may switch you to another inhaler called anoro.  We will call you with these results and instructions ?Follow-up in 6 months ?

## 2021-07-18 NOTE — Progress Notes (Signed)
? ?      ?COLLIS THEDE    [id number]    Jun 04, 1950 ? ?Primary Care Physician:Burchette, Alinda Sierras, MD ? ?Referring Physician: [name], MD ?[address] ?[place],  [place] [address] ? 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 ?HPI: ?71 year old with a history of emphysema, HTN, HLD and recent COVID infection here for consultation after post-COVID changes and a recent lung cancer screening CT. patient was hospitalized for 5 days in January for COVID infection.  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 ? ?Patient reports occasional smoker's cough but denies any shortness of breath, dyspnea on exertion, chills, fevers, wheezing or chest pain.  Patient states he is back to his baseline. ? ?Pets: He has had a cat for 4-5 years. 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 Report working in [employer] with his father at a young age. ?Exposures: No mold, hot or feather pillows or comforters ?Smoking history: Smoked for 50 years with 50-pack-year hx. Quit in 2017, then vaped for 5 years before quitting in 2022 ?Travel history: No significant travel history.  ?Relevant family history: No significant family hx.  ? ?Interim history: ?Started on Trelegy inhaler at last visit.  Feels that it is helping him with reduce dyspnea and chest congestion ? ?Daughter is concerned about ICS and risk of infection as his significant other works in a school and is exposed to recurrent infections ? ?Outpatient Encounter Medications as of 07/18/2021  ?Medication Sig  ? aspirin 81 MG tablet Take 81 mg by mouth daily.  ? ELDERBERRY PO Take 1 tablet by mouth daily.  ? Fluticasone-Umeclidin-Vilant (TRELEGY ELLIPTA) 100-62.5-25 MCG/ACT AEPB Inhale 1 puff into the lungs daily.  ? losartan (COZAAR) 50 MG tablet TAKE 1 TABLET(50 MG)  BY MOUTH DAILY  ? meloxicam (MOBIC) 15 MG tablet Take 15 mg by mouth 3 (three) times daily.  ? Omega-3 Fatty Acids (FISH OIL) 1200 MG CAPS Take 1,200 mg by mouth daily.  ? omeprazole (PRILOSEC) 40 MG capsule TAKE 1 CAPSULE BY MOUTH EVERY DAY  ? oxymetazoline (AFRIN) 0.05 % nasal spray Place 2 sprays into both nostrils 2 (two) times daily as needed for congestion.  ? rosuvastatin (CRESTOR) 10 MG tablet TAKE 1 TABLET(10 MG) BY MOUTH DAILY  ? vitamin B-12 (CYANOCOBALAMIN) 1000 MCG tablet Take 1,000-5,000 mcg by mouth daily.   ? vitamin C (ASCORBIC ACID) 500 MG tablet Take 500 mg by mouth daily.  ? vitamin E 400 UNIT capsule Take 1,200 Units by mouth daily.  ? ?Facility-Administered Encounter Medications as of 07/18/2021  ?Medication  ? 0.9 %  sodium chloride infusion  ? ?Physical Exam: ?Blood pressure 120/80, pulse (!) 56, temperature 97.7 ?F (36.5 ?C), temperature source Oral, height '5\' 5"'$  (1.651 m), weight 196 lb 3.2 oz (89 kg), SpO2 98 %. ?Gen:      No acute distress ?HEENT:  EOMI, sclera anicteric ?Neck:     No masses; no thyromegaly ?Lungs:    Clear to auscultation bilaterally; normal respiratory effort ?CV:         Regular rate and rhythm; no murmurs ?Abd:      + bowel sounds; soft, non-tender; no palpable masses, no distension ?Ext:    No edema; adequate peripheral perfusion ?Skin:      Warm and dry; no rash ?Neuro: alert and oriented x 3 ?Psych:  normal mood and affect  ? ?Data Reviewed: ?Imaging: ?CXR 05/05/2020: Multifocal peripheral and bibasilar pulmonary infiltrate 2/2 COVID-19 pneumonia ?CT chest lung cancer screening 06/21/2020: New quarters peripheral groundglass opacity with traction bronchiectasis and emphysematous changes.  ?High-res CT 03/01/2021-improvement in groundglass opacities, emphysema, bronchial wall thickening, coronary artery disease.   ?I have reviewed the images personally. ? ?Cardiac ?Echo 04/28/2020: EF 60 to 65%, no valvular abnormalities and no diastolic  dysfunction ? ?PFTs: ?03/07/2021 ?FVC 3.42 [99%], FEV1 2.05 [82%], F/F 60, TLC 7.52 [129%], DLCO 13.37 [62%] ?Mild obstruction with overinflation and air trapping ?Moderate diffusion defect ? ?Labs: ?CBC 01/03/2021-WBC 11.2, eos 2.3%, absolute eosinophil count 258 ? ? ?Assessment:  ?COPD ?PFTs reviewed with mild obstruction, or flexion and not trapping consistent with COPD ?Symptoms improved with Trelegy ? ?We had a discussion about inhaled corticosteroids and small risk of increased infection ?Per our discussion today we will check a CBC with differential.  If eosinophils are low then we will switch him to LABA/LAMA with anoro inhaler ?Check IgE and alpha-1 antitrypsin levels and phenotype for baseline evaluation ? ?Post COVID-19 ?Follow-up CT reviewed with resolution of inflammatory changes.  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 ?Ex-smoker ?Continue low-dose screening CT. ?Next scan to be scheduled for December 2023 as a 1 year follow-up from high-res CT this month ? ?Plan/Recommendations: ?Continue Trelegy for now.  Consider anoro ?Check CBC differential, IgE, alpha-1 antitrypsin levels and phenotype ? ?Marshell Garfinkel MD ?[facility] ?07/18/2021, 9:05 AM ? ?CC: Burchette, Alinda Sierras, MD ? ? ? ?

## 2021-07-18 NOTE — Addendum Note (Signed)
Addended by: Elton Sin on: 07/18/2021 09:24 AM ? ? Modules accepted: Orders ? ?

## 2021-07-18 NOTE — Addendum Note (Signed)
Addended by: Elton Sin on: 07/18/2021 09:33 AM ? ? Modules accepted: Orders ? ?

## 2021-07-19 ENCOUNTER — Telehealth: Payer: Self-pay | Admitting: Family Medicine

## 2021-07-19 NOTE — Telephone Encounter (Signed)
Left message for patient's daughter, [name],  to call back and schedule Medicare Annual Wellness Visit (AWV).  ? ?Please offer to do virtually or by telephone.  Left office number and my jabber [phone]. ? ?AWVI eligible as of 05/26/15 ? ?Please schedule at anytime with Nurse Health Advisor. ?  ?

## 2021-07-20 ENCOUNTER — Encounter: Payer: Self-pay | Admitting: Family Medicine

## 2021-07-20 ENCOUNTER — Encounter: Payer: Self-pay | Admitting: Pulmonary Disease

## 2021-07-20 NOTE — Telephone Encounter (Signed)
Dr. Vaughan Browner, pt is requesting lab results and recs. Thanks.  ?

## 2021-07-21 ENCOUNTER — Encounter: Payer: Self-pay | Admitting: Family Medicine

## 2021-07-21 MED ORDER — LOSARTAN POTASSIUM 50 MG PO TABS: ORAL_TABLET | ORAL | 1 refills | Status: DC

## 2021-07-21 MED ORDER — ROSUVASTATIN CALCIUM 10 MG PO TABS: ORAL_TABLET | ORAL | 1 refills | Status: DC

## 2021-07-21 MED ORDER — OMEPRAZOLE 40 MG PO CPDR: 40.0000 mg | DELAYED_RELEASE_CAPSULE | Freq: Every day | ORAL | 0 refills | Status: DC

## 2021-07-21 NOTE — Telephone Encounter (Signed)
Rx sent to pharmacy   

## 2021-07-25 LAB — ALPHA-1 ANTITRYPSIN PHENOTYPE: A-1 Antitrypsin, Ser: 124 mg/dL (ref 83–199)

## 2021-07-25 LAB — IGE: IgE (Immunoglobulin E), Serum: 40 kU/L (ref <=114)

## 2021-07-27 DIAGNOSIS — H10013 Acute follicular conjunctivitis, bilateral: Secondary | ICD-10-CM | POA: Diagnosis not present

## 2021-07-27 DIAGNOSIS — T1511XA Foreign body in conjunctival sac, right eye, initial encounter: Secondary | ICD-10-CM | POA: Diagnosis not present

## 2021-07-30 DIAGNOSIS — J069 Acute upper respiratory infection, unspecified: Secondary | ICD-10-CM | POA: Diagnosis not present

## 2021-07-31 ENCOUNTER — Encounter: Payer: Self-pay | Admitting: Family Medicine

## 2021-08-15 ENCOUNTER — Telehealth: Payer: Self-pay | Admitting: Family Medicine

## 2021-08-15 NOTE — Telephone Encounter (Signed)
Left message for patient to call back and schedule Medicare Annual Wellness Visit (AWV) either virtually or in office. Left  my jabber number [phone]   AWV-I PER [place] 05/26/15  please schedule at anytime with [facility] Nurse Health Advisor 1 or 2   This should be a 45 minute visit.  

## 2021-08-15 NOTE — Progress Notes (Deleted)
Cardiology Office Note    Date:  08/15/2021   ID:  Martin, Navarro [birth year], MRN [id number]   PCP:  [name], MD   [place]  Cardiologist:  Sinclair Grooms, MD *** Advanced Practice Provider:  No care team member to display Electrophysiologist:  None   [phone]   No chief complaint on file.   History of Present Illness:  Martin Navarro is a 71 y.o. male with a hx of chest pain, asthma,  prediabetes, aortic atherosclerosis by CT 03/01/21 and family h/o CAD.  Patient last saw Dr. Tamala Julian 08/03/21 and no complaints.     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   Current Facility-Administered Medications for the 08/23/21 encounter (Appointment) with Imogene Burn, PA-C  Medication   0.9 %  sodium chloride infusion     Allergies:   Sulfa antibiotics   Social History   Socioeconomic History   Marital status: Divorced    Spouse name: Not on file   Number of children: Not on file   Years of education: Not on file   Highest education level: Not on file   Occupational History   Not on file  Tobacco Use   Smoking status: Some Days    Packs/day: 2.00    Years: 53.00    Pack years: 106.00    Types: E-cigarettes, Cigarettes    Last attempt to quit: 04/26/2020    Years since quitting: 1.3   Smokeless tobacco: Former   Tobacco comments:    Stopped tobacco and vape 03/2020  Vaping Use   Vaping Use: Every day  Substance and Sexual Activity   Alcohol use: No   Drug use: Not Currently   Sexual activity: Yes  Other Topics Concern   Not on file  Social History Narrative   Not on file   Social Determinants of Health   Financial Resource Strain: Not on file  Food Insecurity: Not on file  Transportation Needs: Not on file  Physical Activity: Not on file  Stress: Not on file  Social Connections: Not on file     Family History:  The patient's ***family history includes Heart disease (age of onset: 72) in his father; Hypertension in his father.   ROS:   Please see the history of present illness.    ROS All other systems reviewed and are negative.   PHYSICAL EXAM:   VS:  There were no  vitals taken for this visit.  Physical Exam  GEN: Well nourished, well developed, in no acute distress  HEENT: normal  Neck: no JVD, carotid bruits, or masses Cardiac:RRR; no murmurs, rubs, or gallops  Respiratory:  clear to auscultation bilaterally, normal work of breathing GI: soft, nontender, nondistended, + BS Ext: without cyanosis, clubbing, or edema, Good distal pulses bilaterally MS: no deformity or atrophy  Skin: warm and dry, no rash Neuro:  Alert and Oriented x 3, Strength and sensation are intact Psych: euthymic mood, full affect  Wt Readings from Last 3 Encounters:  07/18/21 196 lb 3.2 oz (89 kg)  05/30/21 195 lb (88.5 kg)  04/04/21 195 lb (88.5 kg)      Studies/Labs Reviewed:   EKG:  EKG is*** ordered today.  The ekg ordered today demonstrates ***  Recent Labs: 01/03/2021: ALT 16; BUN 12; Creatinine, Ser 0.87; Potassium 4.8;  Sodium 140; TSH 2.40 07/18/2021: Hemoglobin 13.8; Platelets 212.0   Lipid Panel    Component Value Date/Time   CHOL 139 01/03/2021 0921   TRIG 239.0 (H) 01/03/2021 0921   HDL 38.80 (L) 01/03/2021 0921   CHOLHDL 4 01/03/2021 0921   VLDL 47.8 (H) 01/03/2021 0921   LDLCALC 74 12/24/2019 0941   LDLDIRECT 76.0 01/03/2021 0921    Additional studies/ records that were reviewed today include:   CT 03/01/21 IMPRESSION: 1. Previously seen irregular peripheral interstitial and ground-glass airspace opacity is almost completely resolved, with minimal persisting, bland appearing scarring. Findings are consistent with minimal post infectious residua of COVID airspace disease, with persisting subacute ground-glass opacity seen on prior examination now resolved. 2. Emphysema and diffuse bilateral bronchial wall thickening. 3. Coronary artery disease.   Aortic Atherosclerosis (ICD10-I70.0) and Emphysema (ICD10-J43.9).      FINDINGS: Cardiovascular: Aortic atherosclerosis. Normal heart size. Left and right coronary artery calcifications. No pericardial effusion.   Doppler echocardiogram February 2022   IMPRESSIONS   1. Left ventricular ejection fraction, by estimation, is 60 to 65%. The  left ventricle has normal function. The left ventricle has no regional  wall motion abnormalities. Left ventricular diastolic parameters were  normal.   2. Right ventricular systolic function is normal. The right ventricular  size is normal. Tricuspid regurgitation signal is inadequate for assessing  PA pressure.   3. The mitral valve is normal in structure. Trivial mitral valve  regurgitation.   4. The aortic valve is normal in structure. Aortic valve regurgitation is  trivial.   5. The inferior vena cava is dilated in size with >50% respiratory  variability, suggesting right atrial pressure of 8 mmHg.   Conclusion(s)/Recommendation(s): No vegetation seen but technically  difficult study with poor  visualization of valves. If clinical suspicion  for endocarditis, would recommend TEE.      Risk Assessment/Calculations:   {Does this patient have ATRIAL FIBRILLATION?:[phone]}     ASSESSMENT:    No diagnosis found.   PLAN:  In order of problems listed above:   Coronary atherosclerosis on CT  HLD  Family history of CAD  Pre-diabetes   Shared Decision Making/Informed Consent   {Are you ordering a CV Procedure (e.g. stress test, cath, DCCV, TEE, etc)?   Press F2        :[id number]}    Medication Adjustments/Labs and Tests Ordered: Current medicines are reviewed at length with the patient today.  Concerns regarding medicines are outlined above.  Medication changes, Labs and Tests ordered today are listed in the Patient Instructions below. There are no Patient Instructions on  file for this visit.   Sumner Boast, PA-C  08/15/2021 9:46 AM    [facility] [address], [place], [place]  [address] Phone: [phone]; Fax: [phone]

## 2021-08-23 ENCOUNTER — Ambulatory Visit: Payer: Medicare Other | Admitting: Physician Assistant

## 2021-08-23 DIAGNOSIS — R7303 Prediabetes: Secondary | ICD-10-CM

## 2021-08-23 DIAGNOSIS — E785 Hyperlipidemia, unspecified: Secondary | ICD-10-CM

## 2021-08-23 DIAGNOSIS — Z8249 Family history of ischemic heart disease and other diseases of the circulatory system: Secondary | ICD-10-CM

## 2021-08-23 DIAGNOSIS — I2510 Atherosclerotic heart disease of native coronary artery without angina pectoris: Secondary | ICD-10-CM

## 2021-09-12 ENCOUNTER — Ambulatory Visit: Payer: Medicare Other | Admitting: Physician Assistant

## 2021-09-12 ENCOUNTER — Telehealth: Payer: Self-pay | Admitting: Family Medicine

## 2021-09-12 NOTE — Telephone Encounter (Signed)
Left message for patient to call back and schedule Medicare Annual Wellness Visit (AWV) either virtually or in office. Left  my jabber number [phone]   AWV-I PER [place] 05/26/15  please schedule at anytime with [facility] Nurse Health Advisor 1 or 2   This should be a 45 minute visit.  

## 2021-10-02 ENCOUNTER — Other Ambulatory Visit: Payer: Self-pay | Admitting: Family Medicine

## 2021-10-03 ENCOUNTER — Other Ambulatory Visit: Payer: Self-pay

## 2021-10-03 DIAGNOSIS — K219 Gastro-esophageal reflux disease without esophagitis: Secondary | ICD-10-CM

## 2021-10-03 MED ORDER — OMEPRAZOLE 40 MG PO CPDR: 40.0000 mg | DELAYED_RELEASE_CAPSULE | Freq: Every day | ORAL | 0 refills | Status: DC

## 2021-10-04 NOTE — Progress Notes (Signed)
Office Visit    Patient Name: Martin Navarro Date of Encounter: 10/04/2021  PCP:  [name], MD   [place]  Cardiologist:  Sinclair Grooms, MD  Advanced Practice Provider:  No care team member to display Electrophysiologist:  None   HPI    Martin Navarro is a 71 y.o. male with a hx of chest pain, asthma, aortic atherosclerosis by CT, tobacco abuse and family history of CAD presents today for overdue follow-up appointment.  The patient was last seen by Dr. Tamala Julian 06/20/2019.  He has always had intermittent cardiovascular assessments due to his significant paternal family history of CAD involving his father and his grandmother.  Both had coronary artery bypass grafting.  His father also had a stroke.  He was seen by Dr. Wynonia Lawman in the past.  Lipid therapy has been advised.  He has had at least 2 nuclear perfusion studies, most recently in 2018 that was unremarkable.  Several lung cancer CT scans have demonstrated coronary/aortic atherosclerosis.  At his last appointment he was experiencing a tingling feeling in his left pectoral area.  It occurred randomly.  It was not precipitated by any physical activity.  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  He gets greater than 30 minutes of moderate activity per day.  Discomfort occurs randomly.  It may last 15 to 20 minutes before going away.  Today, he states he is more SOB.  He has noticed over the past couple of months that he feels more short of breath with activity than he had in the past.  He states that he recently was started on trelegy elliptica and it has improved his breathing.  He also shares that he has gained some weight over the past couple of months.  He does stay very active throughout the day but uses a golf cart to get around.  We discussed increasing his cardiovascular activity and trying to take walks in the morning or in the evenings.   His blood pressure is slightly elevated today in the office.  He is encouraged to take his blood pressure at home.  Although his shortness of breath may be due to multiple factors, we discussed getting an echocardiogram to rule out worsening heart function.  I also reviewed his lipid panel and requested that his PCP send [place] a copy of his updated lipid panel that he will get done in October.  He also states that he has experienced some numbness in his feet especially when sitting for prolonged periods of time.  Reports no chest pain, pressure, or tightness. No edema, orthopnea, PND. Reports no palpitations.    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 The left ventricular ejection fraction is hyperdynamic (>65%). There was no ST segment deviation noted during stress. This is a low risk study. There is no evidence of ischemia or previous infarction. The study is normal.  Echocardiogram 04/28/20 IMPRESSIONS     1. Left  ventricular ejection fraction, by estimation, is 60 to 65%. The  left ventricle has normal function. The left ventricle has no regional  wall motion abnormalities. Left ventricular diastolic parameters were  normal.   2. Right ventricular systolic function is normal. The right ventricular  size is normal. Tricuspid regurgitation signal is inadequate for assessing  PA pressure.   3. The mitral valve is normal in structure. Trivial mitral valve  regurgitation.   4. The aortic valve is normal in structure. Aortic valve regurgitation is  trivial.   5. The inferior vena cava is dilated in size with >50% respiratory  variability, suggesting right atrial pressure of 8 mmHg.   Conclusion(s)/Recommendation(s): No vegetation seen but technically  difficult study with poor visualization of valves. If clinical suspicion  for endocarditis, would recommend TEE.   FINDINGS   Left Ventricle: Left ventricular ejection fraction, by estimation, is 60  to 65%. The left ventricle has normal function. The left ventricle has no  regional wall motion abnormalities. The left ventricular internal cavity  size was normal in size. There is   no left ventricular hypertrophy. Left ventricular diastolic parameters  were normal.   Right Ventricle: The right ventricular size is normal. No increase in  right ventricular wall thickness. Right ventricular systolic function is  normal. Tricuspid regurgitation signal is inadequate for assessing PA  pressure.   Pericardium: There is no evidence of pericardial effusion.   Mitral Valve: The mitral valve is normal in structure. Trivial mitral  valve regurgitation.   Tricuspid Valve: The tricuspid valve is normal in structure. Tricuspid  valve regurgitation is trivial.   Aortic Valve: The aortic valve is normal in structure. Aortic valve  regurgitation is trivial. Aortic regurgitation PHT measures 702 msec.   Aorta: The aortic root is normal in size and structure.    Venous: The inferior vena cava is dilated in size with greater than 50%  respiratory variability, suggesting right atrial pressure of 8 mmHg.   IAS/Shunts: The interatrial septum was not well visualized.   EKG:  EKG is  ordered today.  The ekg ordered today demonstrates sinus bradycardia, rate 54 with first-degree AV block  Recent Labs: 01/03/2021: ALT 16; BUN 12; Creatinine, Ser 0.87; Potassium 4.8; Sodium 140; TSH 2.40 07/18/2021: Hemoglobin 13.8; Platelets 212.0  Recent Lipid Panel    Component Value Date/Time   CHOL 139 01/03/2021 0921   TRIG 239.0 (H) 01/03/2021 0921   HDL 38.80 (L) 01/03/2021 0921   CHOLHDL 4 01/03/2021 0921   VLDL 47.8 (H) 01/03/2021 0921   LDLCALC 74 12/24/2019 0941   LDLDIRECT 76.0 01/03/2021 0921    Home Medications   No outpatient medications have been marked as taking for the 10/10/21 encounter (Appointment) with Elgie Collard, PA-C.   Current Facility-Administered Medications for the  10/10/21 encounter (Appointment) with Elgie Collard, PA-C  Medication   0.9 %  sodium chloride infusion     Review of Systems      All other systems reviewed and are otherwise negative except as noted above.  Physical Exam    VS:  There were no vitals taken for this visit. , BMI There is no height or weight on file to calculate BMI.  Wt Readings from Last 3 Encounters:  07/18/21 196 lb 3.2 oz (89 kg)  05/30/21 195 lb (88.5 kg)  04/04/21 195 lb (88.5 kg)     GEN: Well nourished, well developed, in no acute distress. HEENT: normal. Neck: Supple, no JVD, carotid bruits, or masses. Cardiac: RRR, no murmurs, rubs, or gallops. No clubbing, cyanosis, edema.  Radials/PT 2+ and equal bilaterally.  Respiratory:  Respirations regular and unlabored, clear to auscultation bilaterally. GI: Soft, nontender, nondistended. MS: No deformity or atrophy. Skin: Warm and dry, no rash. Neuro:  Strength and sensation are intact. Psych: Normal affect.  Assessment & Plan     Atypical chest pain -No further chest pain -reviewed most recent echocardiogram 2/22 and lexiscan 4/21  CAD in native artery -Last Lexiscan Myoview reviewed with the patient done in 2021.  No evidence of ischemic disease. -We will perform echocardiogram to further evaluate worsening shortness of breath  Hyperlipidemia -Most recent lipid panel 12/2020 showed total cholesterol 139, HDL 38, LDL 74, and triglycerides 239 -He will be due in October for a repeat lipid panel.  Continue Crestor 10 mg daily and fish oil 1200 mg daily  SOB -Echocardiogram ordered  -may have to do with increased weight and Asthma/COPD -smoking cessation -increase activity level, discussing walking in the morning and evening when its cooler outside     Disposition: Follow up in 3 months with Sinclair Grooms, MD or APP.  Signed, Elgie Collard, PA-C 10/04/2021, 9:42 AM [place]

## 2021-10-10 ENCOUNTER — Encounter: Payer: Self-pay | Admitting: Physician Assistant

## 2021-10-10 ENCOUNTER — Ambulatory Visit: Payer: Medicare Other | Admitting: Physician Assistant

## 2021-10-10 VITALS — BP 142/78 | HR 54 | Ht 65.0 in | Wt 192.0 lb

## 2021-10-10 DIAGNOSIS — R0602 Shortness of breath: Secondary | ICD-10-CM

## 2021-10-10 DIAGNOSIS — Z720 Tobacco use: Secondary | ICD-10-CM

## 2021-10-10 DIAGNOSIS — E785 Hyperlipidemia, unspecified: Secondary | ICD-10-CM | POA: Diagnosis not present

## 2021-10-10 DIAGNOSIS — I2510 Atherosclerotic heart disease of native coronary artery without angina pectoris: Secondary | ICD-10-CM | POA: Diagnosis not present

## 2021-10-10 DIAGNOSIS — R0789 Other chest pain: Secondary | ICD-10-CM | POA: Diagnosis not present

## 2021-10-10 NOTE — Patient Instructions (Signed)
Medication Instructions:  Your physician recommends that you continue on your current medications as directed. Please refer to the Current Medication list given to you today.  *If you need a refill on your cardiac medications before your next appointment, please call your pharmacy*   Lab Work: Have your primary care provider fax [place] your lab results when you have them drawn [phone] If you have labs (blood work) drawn today and your tests are completely normal, you will receive your results only by: [place] (if you have MyChart) OR A paper copy in the mail If you have any lab test that is abnormal or we need to change your treatment, we will call you to review the results.   Testing/Procedures: Your physician has requested that you have an echocardiogram. Echocardiography is a painless test that uses sound waves to create images of your heart. It provides your doctor with information about the size and shape of your heart and how well your heart's chambers and valves are working. This procedure takes approximately one hour. There are no restrictions for this procedure.    Follow-Up: At [facility], you and your health needs are our priority.  As part of our continuing mission to provide you with exceptional heart care, we have created designated Provider Care Teams.  These Care Teams include your primary Cardiologist (physician) and Advanced Practice Providers (APPs -  Physician Assistants and Nurse Practitioners) who all work together to provide you with the care you need, when you need it.  Your next appointment:   3 month(s)  The format for your next appointment:   In Person  Provider:   Sinclair Grooms, MD {  Important Information About [place]

## 2021-10-27 ENCOUNTER — Other Ambulatory Visit: Payer: Self-pay | Admitting: Family Medicine

## 2021-10-27 DIAGNOSIS — K219 Gastro-esophageal reflux disease without esophagitis: Secondary | ICD-10-CM

## 2021-11-02 ENCOUNTER — Other Ambulatory Visit (HOSPITAL_BASED_OUTPATIENT_CLINIC_OR_DEPARTMENT_OTHER): Payer: Medicare Other

## 2021-11-07 ENCOUNTER — Other Ambulatory Visit: Payer: Self-pay | Admitting: Pulmonary Disease

## 2021-11-07 ENCOUNTER — Telehealth: Payer: Self-pay | Admitting: Family Medicine

## 2021-11-07 NOTE — Telephone Encounter (Signed)
Left message for patient to call back and schedule Medicare Annual Wellness Visit (AWV) either virtually or in office. Left  my Herbie Drape number [phone]    AWV-I PER [place] 05/26/15 ; please schedule at anytime with [facility] Nurse Health Advisor 1 or 2   This should be a 45 minute visit.

## 2021-11-15 ENCOUNTER — Ambulatory Visit (INDEPENDENT_AMBULATORY_CARE_PROVIDER_SITE_OTHER): Payer: Medicare Other

## 2021-11-15 DIAGNOSIS — R0602 Shortness of breath: Secondary | ICD-10-CM | POA: Diagnosis not present

## 2021-11-15 LAB — ECHOCARDIOGRAM COMPLETE
MV M vel: 5.58 m/s
MV Peak grad: 124.5 mmHg
P 1/2 time: 1066 msec
Radius: 0.8 cm
S' Lateral: 2.1 cm

## 2021-11-21 ENCOUNTER — Other Ambulatory Visit: Payer: Self-pay | Admitting: *Deleted

## 2021-11-21 NOTE — Patient Outreach (Signed)
  Care Coordination   11/21/2021 Name: Martin Navarro MRN: [id number] DOB: 12-31-1950   Care Coordination Outreach Attempts:  An unsuccessful telephone outreach was attempted today to offer the patient information about available care coordination services as a benefit of their health plan.   Follow Up Plan:  Additional outreach attempts will be made to offer the patient care coordination information and services.   Encounter Outcome:  No Answer  Care Coordination Interventions Activated:  No   Care Coordination Interventions:  No, not indicated    Raina Mina, RN Care Management Coordinator [place] Office [phone]

## 2021-12-05 ENCOUNTER — Telehealth: Payer: Self-pay | Admitting: Family Medicine

## 2021-12-05 NOTE — Telephone Encounter (Signed)
Left message for patient to call back and schedule Medicare Annual Wellness Visit (AWV) either virtually or in office. Left  my Martin Navarro number [phone]   AWV-I PER [place] 05/26/15  please schedule at anytime with [facility] Nurse Health Advisor 1 or 2   This should be a 45 minute visit.

## 2021-12-15 ENCOUNTER — Ambulatory Visit: Payer: Self-pay

## 2021-12-15 NOTE — Patient Outreach (Signed)
  Care Coordination   Initial Visit Note   12/15/2021 Name: Martin Navarro MRN: [id number] DOB: [birth year]  [place] Service is a 71 y.o. year old male who sees Burchette, Alinda Sierras, MD for primary care. I  spoke with patients daughter Martin Navarro by phone today.  What matters to the patients health and wellness today?  No concerns, doing well.    Goals Addressed             This Visit's Progress    COMPLETED: Care Coordination Activities       Care Coordination Interventions: [facility]Do[facility] screening performed - patient doing well at home per daughter with no resource needs Reviewed the role of the care coordination team instructing patient's daughter Martin Navarro to contact patients primary care provider if assistance is needed in the future        SDOH assessments and interventions completed:  Yes  SDOH Interventions Today    Flowsheet Row Most Recent Value  SDOH Interventions   Food Insecurity Interventions Intervention Not Indicated  Housing Interventions Intervention Not Indicated  Transportation Interventions Intervention Not Indicated        Care Coordination Interventions Activated:  Yes  Care Coordination Interventions:  Yes, provided   Follow up plan: No further intervention required.   Encounter Outcome:  Pt. Visit Completed   Daneen Schick, BSW, CDP Social Worker, Certified Dementia Practitioner [place] [facility]  Care Coordination [phone]

## 2021-12-20 ENCOUNTER — Encounter: Payer: Self-pay | Admitting: Family Medicine

## 2021-12-20 ENCOUNTER — Ambulatory Visit (INDEPENDENT_AMBULATORY_CARE_PROVIDER_SITE_OTHER): Payer: Medicare Other | Admitting: Family Medicine

## 2021-12-20 VITALS — BP 170/96 | HR 79 | Temp 97.9°F | Ht 65.0 in | Wt 194.1 lb

## 2021-12-20 DIAGNOSIS — I10 Essential (primary) hypertension: Secondary | ICD-10-CM | POA: Diagnosis not present

## 2021-12-20 DIAGNOSIS — E785 Hyperlipidemia, unspecified: Secondary | ICD-10-CM

## 2021-12-20 MED ORDER — AMLODIPINE BESYLATE 5 MG PO TABS: 5.0000 mg | ORAL_TABLET | Freq: Every day | ORAL | 1 refills | Status: DC

## 2021-12-20 NOTE — Progress Notes (Signed)
Established Patient Office Visit  Subjective   Patient ID: Martin Navarro, male    DOB: Feb 01, 1951  Age: 71 y.o. MRN: [id number]  Chief Complaint  Patient presents with   Hypertension    HPI   Martin Navarro seen for follow-up regarding hypertension.  His daughter had called to set up this appointment.  Patient has had several readings over the past week that have been elevated.  Lowest blood pressure was 140/85 with high of 176/83.  He denies any headaches.  No peripheral edema.  No chest pains.  He takes losartan 50 mg daily.  No nonsteroidal use.  Does occasionally have somewhat of a "flushed "feeling.  No diarrhea.  No abdominal pain.  No alcohol use.  Reported allergy to sulfa.  No recent dietary changes.  He does eat out quite often.  Other medical problems include history of COPD, GERD, osteoarthritis, hyperlipidemia.  He has scheduled follow-up for physical next month.  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 He has a 106.00 pack-year smoking history. He has quit using smokeless tobacco. He reports that he does not currently use drugs. He reports that he does not drink alcohol. family history  includes Heart disease (age of onset: 80) in his father; Hypertension in his father. Allergies  Allergen Reactions   Sulfa Antibiotics     Unknown    Review of Systems  Constitutional:  Negative for malaise/fatigue.  Eyes:  Negative for blurred vision.  Respiratory:  Negative for shortness of breath.   Cardiovascular:  Negative for chest pain.  Genitourinary:  Negative for dysuria.  Neurological:  Negative for dizziness, weakness and headaches.      Objective:     BP (!) 170/96 (BP Location: Left Arm, Patient Position: Sitting, Cuff Size: Normal)   Pulse 79   Temp 97.9 F (36.6 C) (Oral)   Ht '5\' 5"'$  (1.651 m)   Wt 194 lb 1.6 oz (88 kg)   SpO2 98%   BMI 32.30 kg/m  BP Readings from Last 3 Encounters:  12/20/21 (!) 170/96  10/10/21 (!) 142/78  07/18/21 120/80   Wt Readings from Last 3 Encounters:  12/20/21 194 lb 1.6 oz (88 kg)  10/10/21 192 lb (87.1 kg)  07/18/21 196 lb 3.2 oz (89 kg)      Physical Exam Vitals reviewed.  Constitutional:      Appearance: He is well-developed.  Eyes:     Pupils: Pupils are equal, round, and reactive to light.  Neck:     Thyroid: No thyromegaly.  Cardiovascular:     Rate and Rhythm: Normal rate and regular rhythm.  Pulmonary:     Effort: Pulmonary effort is normal. No respiratory distress.     Breath sounds: Normal breath sounds. No wheezing or rales.  Musculoskeletal:     Cervical back: Neck supple.     Right lower leg: No edema.     Left lower leg: No edema.  Neurological:     Mental Status: He is alert and oriented to person, place, and time.      No results found for any visits on 12/20/21.    The 10-year ASCVD risk score (Arnett DK, et al., 2019) is: 39.2%    Assessment & Plan:   #1 hypertension.  Recent exacerbation with several readings over the past week above goal.  We discussed several items as follows  -Continue losartan 50 mg daily -Try to watch sodium intake and handout given on DASH diet -Add  amlodipine 5 mg once daily -Continue close home monitoring of blood pressure -Would probably try to avoid thiazides with history of reported sulfa allergy  #2 hyperlipidemia.  Patient on Crestor.  Due for follow-up labs but he prefers to wait and get at physical.   Carolann Littler, MD

## 2021-12-24 ENCOUNTER — Other Ambulatory Visit: Payer: Self-pay | Admitting: Family Medicine

## 2021-12-24 DIAGNOSIS — K219 Gastro-esophageal reflux disease without esophagitis: Secondary | ICD-10-CM

## 2021-12-27 NOTE — Progress Notes (Signed)
Error

## 2022-01-02 ENCOUNTER — Encounter: Payer: Medicare Other | Admitting: Physician Assistant

## 2022-01-02 ENCOUNTER — Ambulatory Visit: Payer: Medicare Other | Admitting: Interventional Cardiology

## 2022-01-02 DIAGNOSIS — I2510 Atherosclerotic heart disease of native coronary artery without angina pectoris: Secondary | ICD-10-CM

## 2022-01-02 DIAGNOSIS — E785 Hyperlipidemia, unspecified: Secondary | ICD-10-CM

## 2022-01-02 DIAGNOSIS — R0789 Other chest pain: Secondary | ICD-10-CM

## 2022-01-02 DIAGNOSIS — R0602 Shortness of breath: Secondary | ICD-10-CM

## 2022-01-02 DIAGNOSIS — Z720 Tobacco use: Secondary | ICD-10-CM

## 2022-01-11 ENCOUNTER — Telehealth: Payer: Self-pay | Admitting: Family Medicine

## 2022-01-11 NOTE — Telephone Encounter (Signed)
Left message for patient to call back and schedule Medicare Annual Wellness Visit (AWV) either virtually or in office. Left  my jabber number [phone]   AWV-I PER [place] 05/26/15 please schedule with Nurse Health Adviser   45 min for awv-i and in office appointments 30 min for awv-s  phone/virtual appointments  

## 2022-01-16 ENCOUNTER — Encounter: Payer: Self-pay | Admitting: Family Medicine

## 2022-01-16 ENCOUNTER — Ambulatory Visit (INDEPENDENT_AMBULATORY_CARE_PROVIDER_SITE_OTHER): Payer: Medicare Other | Admitting: Family Medicine

## 2022-01-16 VITALS — BP 148/80 | HR 70 | Temp 97.8°F | Ht 63.78 in | Wt 194.9 lb

## 2022-01-16 DIAGNOSIS — I10 Essential (primary) hypertension: Secondary | ICD-10-CM | POA: Diagnosis not present

## 2022-01-16 DIAGNOSIS — Z833 Family history of diabetes mellitus: Secondary | ICD-10-CM

## 2022-01-16 DIAGNOSIS — J449 Chronic obstructive pulmonary disease, unspecified: Secondary | ICD-10-CM

## 2022-01-16 DIAGNOSIS — Z0000 Encounter for general adult medical examination without abnormal findings: Secondary | ICD-10-CM

## 2022-01-16 DIAGNOSIS — E785 Hyperlipidemia, unspecified: Secondary | ICD-10-CM

## 2022-01-16 LAB — HEPATIC FUNCTION PANEL
ALT: 17 U/L (ref 0–53)
AST: 15 U/L (ref 0–37)
Albumin: 4.1 g/dL (ref 3.5–5.2)
Alkaline Phosphatase: 61 U/L (ref 39–117)
Bilirubin, Direct: 0.2 mg/dL (ref 0.0–0.3)
Total Bilirubin: 0.7 mg/dL (ref 0.2–1.2)
Total Protein: 6.9 g/dL (ref 6.0–8.3)

## 2022-01-16 LAB — LIPID PANEL
Cholesterol: 123 mg/dL (ref 0–200)
HDL: 35.9 mg/dL — ABNORMAL LOW (ref >39.00)
LDL Cholesterol: 64 mg/dL (ref 0–99)
NonHDL: 86.69
Total CHOL/HDL Ratio: 3
Triglycerides: 115 mg/dL (ref 0.0–149.0)
VLDL: 23 mg/dL (ref 0.0–40.0)

## 2022-01-16 LAB — BASIC METABOLIC PANEL
BUN: 16 mg/dL (ref 6–23)
CO2: 27 mEq/L (ref 19–32)
Calcium: 9.1 mg/dL (ref 8.4–10.5)
Chloride: 107 mEq/L (ref 96–112)
Creatinine, Ser: 0.78 mg/dL (ref 0.40–1.50)
GFR: 89.03 mL/min (ref >60.00)
Glucose, Bld: 123 mg/dL — ABNORMAL HIGH (ref 70–99)
Potassium: 4.6 mEq/L (ref 3.5–5.1)
Sodium: 141 mEq/L (ref 135–145)

## 2022-01-16 LAB — HEMOGLOBIN A1C: Hgb A1c MFr Bld: 6.8 % — ABNORMAL HIGH (ref 4.6–6.5)

## 2022-01-16 IMAGING — CT CT CHEST HIGH RESOLUTION
2 of 7 series · 14 of 36 positions shown, 17 images · non-contrast
Comparison: 06/21/2020

CLINICAL DATA: Post inflammatory fibrosis, history of COVID, former
smoker

EXAM:
CT CHEST WITHOUT CONTRAST
TECHNIQUE: Multidetector CT imaging of the chest was performed following the
standard protocol without intravenous contrast. High resolution
imaging of the lungs, as well as inspiratory and expiratory imaging,
was performed.

[Series 4: high resolution · axial · 0.72mm/px · z∈[-286,-42]mm · 11 of 294 slices shown, 14 images]
[im 25/294  mediastinal]
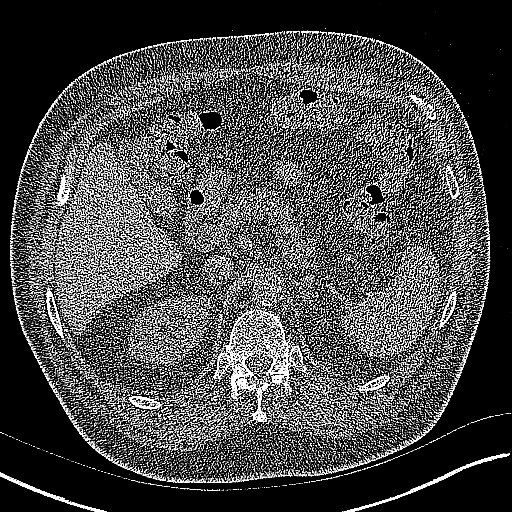
[im 25/294  lung]
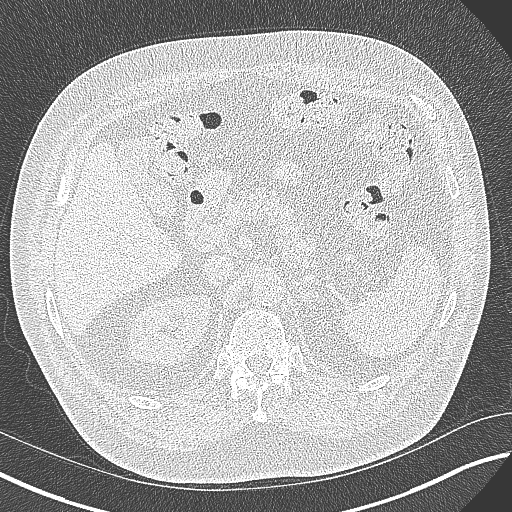
[im 49/294  lung]
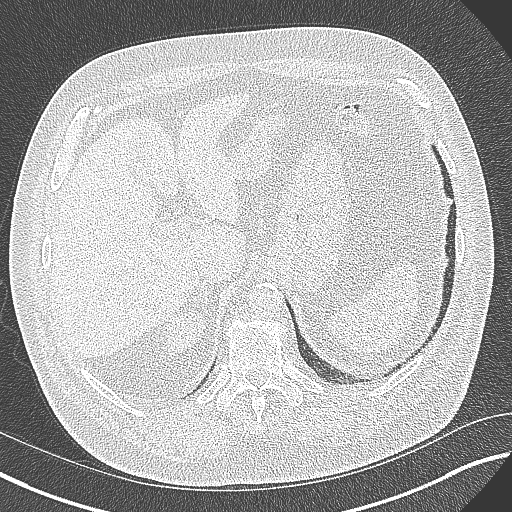
[im 74/294  lung]
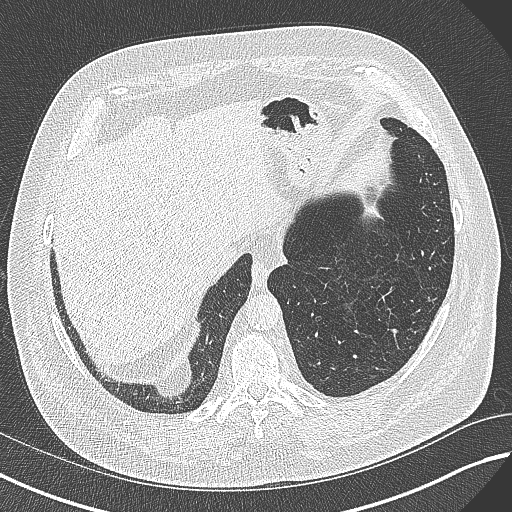
[im 98/294  lung]
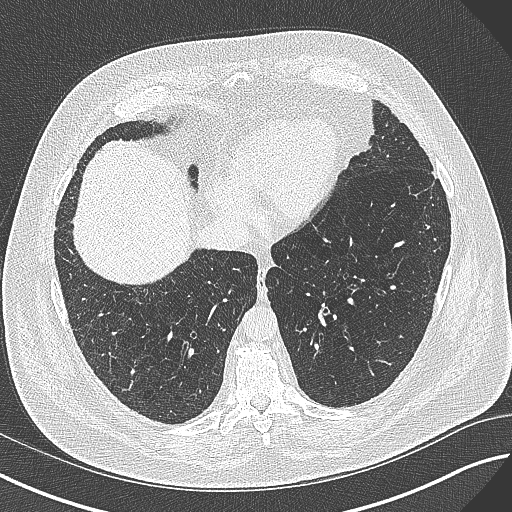
[im 123/294  mediastinal]
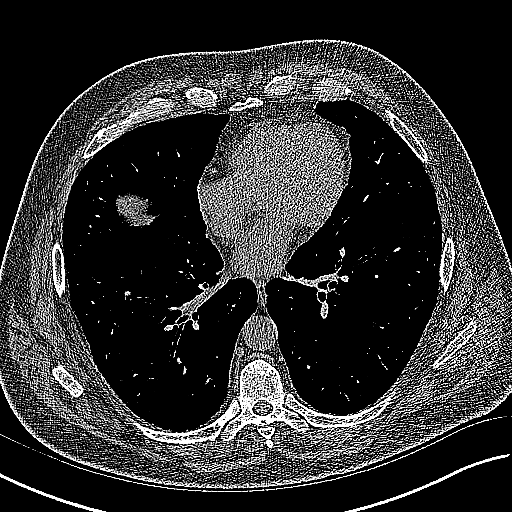
[im 123/294  lung]
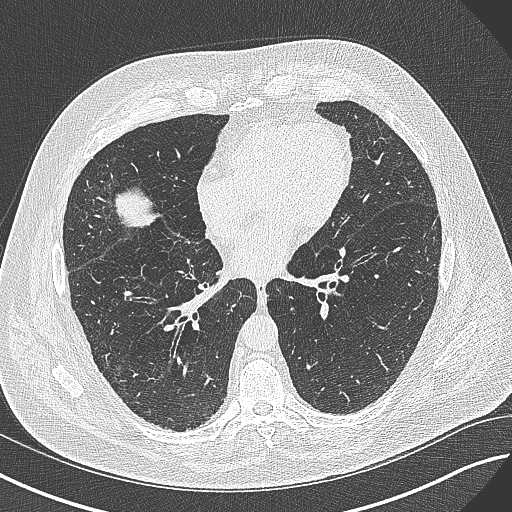
[im 147/294  lung]
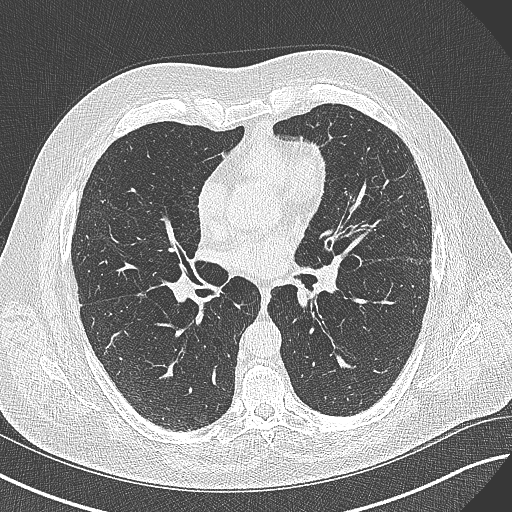
[im 171/294  lung]
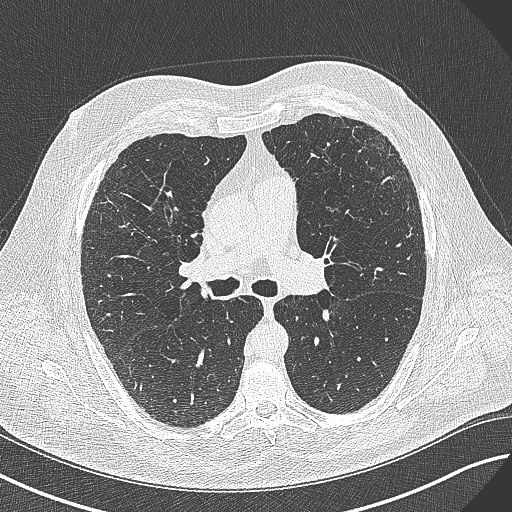
[im 196/294  lung]
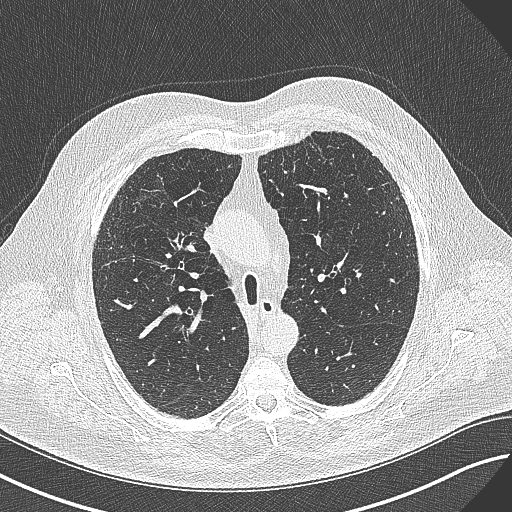
[im 220/294  mediastinal]
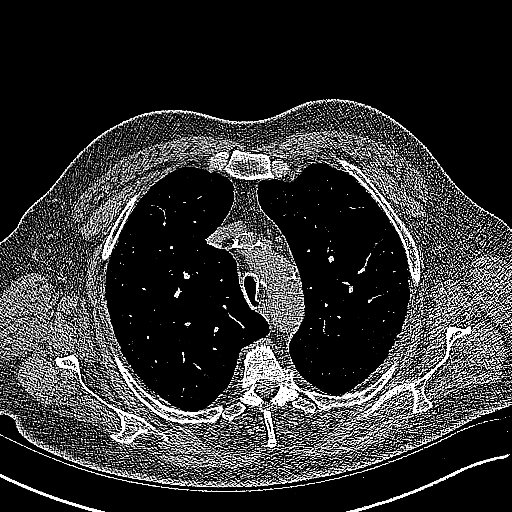
[im 220/294  lung]
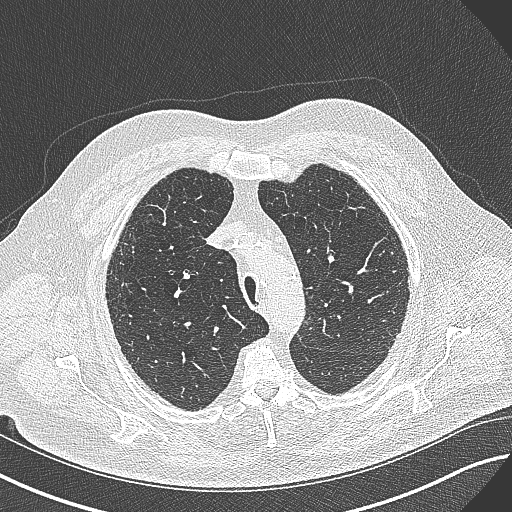
[im 245/294  lung]
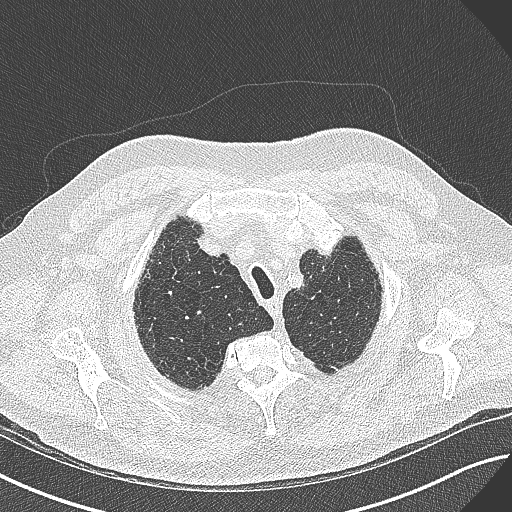
[im 269/294  lung]
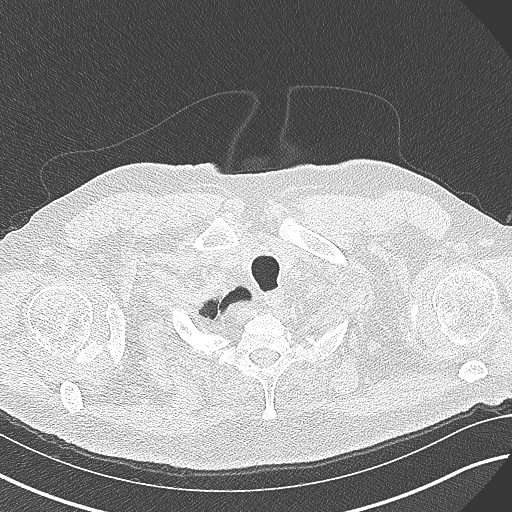

[Series 6: coronal · coronal · 0.59mm/px · 3 of 142 slices shown]
[im 29/142  lung]
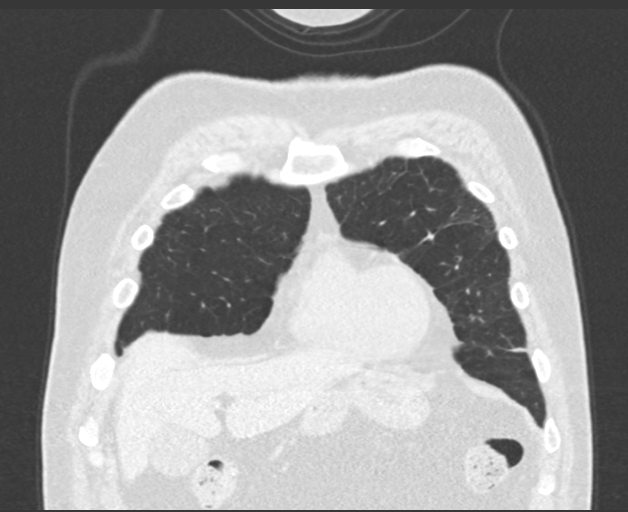
[im 57/142  lung]
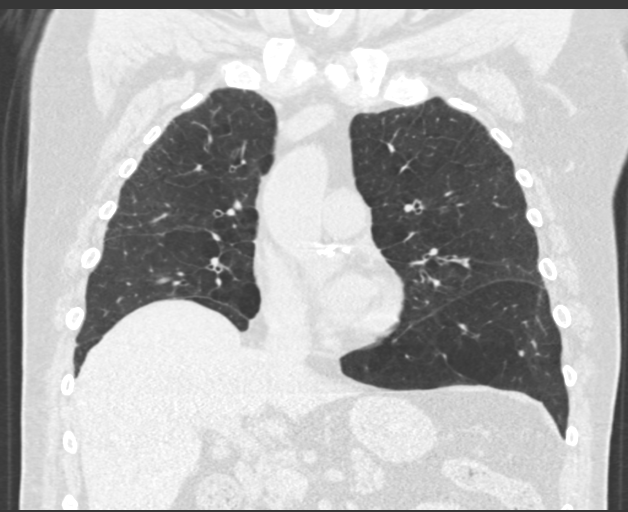
[im 85/142  lung]
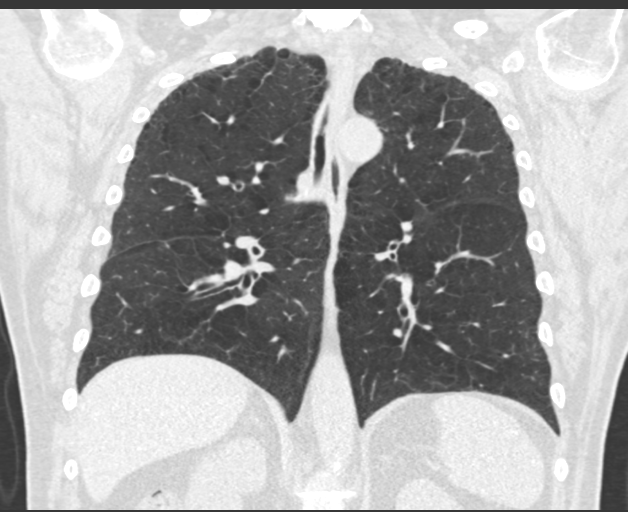

[14 of 36 positions shown; findings below may reference images not displayed]

FINDINGS: Cardiovascular: Aortic atherosclerosis. Normal heart size. Left and
right coronary artery calcifications. No pericardial effusion.

Mediastinum/Nodes: No enlarged mediastinal, hilar, or axillary lymph
nodes. Thyroid gland, trachea, and esophagus demonstrate no
significant findings.

Lungs/Pleura: Moderate centrilobular and paraseptal emphysema.
Diffuse bilateral bronchial wall thickening. Previously seen
irregular peripheral interstitial and ground-glass airspace opacity
is almost completely resolved, with minimal persisting, bland
appearing scarring. No significant air trapping on expiratory phase
imaging. No pleural effusion or pneumothorax.

Upper Abdomen: No acute abnormality.

Musculoskeletal: No chest wall mass or suspicious bone lesions
identified.
IMPRESSION: 1. Previously seen irregular peripheral interstitial and
ground-glass airspace opacity is almost completely resolved, with
minimal persisting, bland appearing scarring. Findings are
consistent with minimal post infectious residua of COVID airspace
disease, with persisting subacute ground-glass opacity seen on prior
examination now resolved.
2. Emphysema and diffuse bilateral bronchial wall thickening.
3. Coronary artery disease.

Aortic Atherosclerosis (7DVGU-MTW.W) and Emphysema (7DVGU-Y05.4).

## 2022-01-16 MED ORDER — AMLODIPINE BESYLATE 5 MG PO TABS: 5.0000 mg | ORAL_TABLET | Freq: Every day | ORAL | 3 refills | Status: DC

## 2022-01-16 NOTE — Patient Instructions (Signed)
Consider Shingrix (shingles vaccine) at some point this year.

## 2022-01-16 NOTE — Progress Notes (Signed)
Established Patient Office Visit  Subjective   Patient ID: Martin Navarro, male    DOB: 1950-08-25  Age: 71 y.o. MRN: [id number]  No chief complaint on file.   HPI   Mr. Viner seen for well visit/physical.  He has history of hypertension, COPD, hyperlipidemia, longstanding history of nicotine use.  Generally doing well.  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 90 additional units to his current facility.  No specific complaints today.  No recent chest pains.  No dizziness.  Recent addition of amlodipine 5 mg daily and home blood pressures have consistently been much improved.  He brings in a log for review and most of these are 696-789 systolic and 38-10 diastolic.  Denies any side effects from the amlodipine.  Health maintenance reviewed  -He declines flu vaccine -No history of Shingrix but has had Zostavax.  Does not consent to Shingrix today -Tetanus due 2032 -Just had colonoscopy 3/23 -Pneumonia vaccines complete -Prior hepatitis C screening negative -He has participating in annual low-dose CT lung cancer screening  Family history-mother is 49 with no active medical problems.  She is a lives independently.  His father died age 61.  His father had coronary disease age 69.  He has a sister who is 78 who has no known active medical problems.  Social history-divorced.  Has 1 daughter.  No grandchildren.  Former smoker.  Occasional marijuana use.  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  Past Medical History:  Diagnosis Date   Acute prostatitis 09/21/2016   AKI (acute kidney injury) ([place])    Asthma    Cancer ([place])    Chest pain    COPD (chronic obstructive pulmonary disease) ([place])    Dyspnea    Emphysema of lung ([place])    Epididymoorchitis 01/28/2017   Erectile dysfunction    Family history of ischemic heart disease and other diseases of the circulatory system    GERD  (gastroesophageal reflux disease) 10/25/2010   History of leukocytosis    Hyperlipidemia    Hypertension    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 He has a 106.00 pack-year smoking history. He has quit using smokeless tobacco. He reports that he does not currently use drugs. He reports that he does not drink alcohol. family history includes Heart disease (age of onset: 59) in his father; Hypertension in his father. Allergies  Allergen Reactions   Sulfa Antibiotics     Unknown    Review of Systems  Constitutional:  Negative for chills, fever, malaise/fatigue and weight loss.  HENT:  Negative for hearing loss.   Eyes:  Negative for blurred vision and double vision.  Respiratory:  Negative for cough and shortness of breath.   Cardiovascular:  Negative for chest pain, palpitations and leg swelling.  Gastrointestinal:  Negative for abdominal pain, blood in stool, constipation and diarrhea.  Genitourinary:  Negative for dysuria.  Skin:  Negative for rash.  Neurological:  Negative for dizziness, speech change, seizures, loss of consciousness and headaches.  Psychiatric/Behavioral:  Negative for depression.       Objective:     BP (!) 148/80 (BP Location: Left Arm, Cuff Size: Normal)   Pulse 70   Temp 97.8 F (36.6 C) (Oral)   Ht 5' 3.78" (1.62 m)   Wt 194 lb 14.4 oz (88.4 kg)   SpO2 96%   BMI 33.69 kg/m    Physical Exam Vitals reviewed.  Constitutional:      General: He is not in acute distress.    Appearance: He is well-developed. He is not ill-appearing.  HENT:     Right Ear: External ear normal.     Left Ear: External ear normal.  Eyes:     Pupils: Pupils are equal, round,  and reactive to light.  Neck:     Thyroid: No thyromegaly.  Cardiovascular:     Rate and Rhythm: Normal rate and regular rhythm.  Pulmonary:     Effort: Pulmonary effort is normal. No respiratory distress.     Breath sounds: Normal breath sounds. No wheezing or rales.  Abdominal:     General: There is no distension.     Palpations: Abdomen is soft. There is no mass.     Tenderness: There is no abdominal tenderness. There is no guarding or rebound.  Musculoskeletal:     Cervical back: Neck supple.     Right lower leg: No edema.     Left lower leg: No edema.  Skin:    Findings: No rash.     Comments: Superficial abrasion on both knees.  He is not sure how this occurred.  No signs of secondary infection.  Neurological:     Mental Status: He is alert and oriented to person, place, and time.      No results found for any visits on 01/16/22.    The 10-year ASCVD risk score (Arnett DK, et al., 2019) is: 32.2%    Assessment & Plan:   #1 physical exam.  We discussed the following health maintenance items.  We suggested flu vaccine but he declines.  We discussed Shingrix vaccine and he wishes to discuss with daughter.  He has had prior Zostavax.  Colonoscopy up-to-date will not likely get any further given his age  #2 hypertension.  Improved following recent addition of amlodipine.  Slightly up today but well controlled by home readings.  We have checked his cuff previously for accuracy with ours and had fairly congruent readings.  Continue close monitoring.  Check basic metabolic panel  #3 hyperlipidemia treated with Crestor.  Recheck lipid and hepatic panel  #4 past history of smoking.  Patient participating with annual low-dose CT lung cancer screening  #5 family history of type 2 diabetes.  Check A1c   No follow-ups on file.    Carolann Littler, MD

## 2022-01-16 NOTE — Progress Notes (Signed)
Office Visit    Patient Name: Martin Navarro Date of Encounter: 01/17/2022  PCP:  [name], MD   [place]  Cardiologist:  Sinclair Grooms, MD  Advanced Practice Provider:  No care team member to display Electrophysiologist:  None   HPI    Martin Navarro is a 71 y.o. male with a hx of chest pain, asthma, aortic atherosclerosis by CT, tobacco abuse and family history of CAD presents today for overdue follow-up appointment.  The patient was last seen by Dr. Tamala Julian 06/20/2019.  He has always had intermittent cardiovascular assessments due to his significant paternal family history of CAD involving his father and his grandmother.  Both had coronary artery bypass grafting.  His father also had a stroke.  He was seen by Dr. Wynonia Lawman in the past.  Lipid therapy has been advised.  He has had at least 2 nuclear perfusion studies, most recently in 2018 that was unremarkable.  Several lung cancer CT scans have demonstrated coronary/aortic atherosclerosis.  At his last appointment he was experiencing a tingling feeling in his left pectoral area.  It occurred randomly.  It was not precipitated by any physical activity.  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  He gets greater than 30 minutes of moderate activity per day.  Discomfort occurs randomly.  It may last 15 to 20 minutes before going away.  He was last seen by me 10/10/2021 and at that time he was having more shortness of breath.  He has noticed over the past couple of months that he feels more short of breath with activity than he had in the past.  He states that he recently was started on trelegy elliptica and it has improved his breathing.  He also shares that he has gained some weight over the past couple of months.  He does stay very active throughout the day but uses a golf cart to get around.  We discussed increasing his cardiovascular activity and  trying to take walks in the morning or in the evenings.  His blood pressure is slightly elevated today in the office.  He is encouraged to take his blood pressure at home.  Although his shortness of breath may be due to multiple factors, we discussed getting an echocardiogram to rule out worsening heart function.  I also reviewed his lipid panel and requested that his PCP send [place] a copy of his updated lipid panel that he will get done in October.  He also states that he has experienced some numbness in his feet especially when sitting for prolonged periods of time.  Today, he feels good today. He did have some weight gain recently which may be contributing to his shortness of breath.  We discussed increasing exercise.  He states he is active throughout the day but does not have any structured exercise plan at that time.  We reviewed his most recent lab work done at his primary care.  Blood pressure is slightly elevated today at 142/90.  He tells me that at home it is usually much lower 120/70.  He is encouraged to continue taking his blood pressure at home.  Recently amlodipine was added to his regimen for better control.  His primary also wanted to start him on metformin since his A1c is now 6.8.  He would rather work on dietary changes then start a new medication.  We discussed a low-sodium  diet as well for his blood pressure.  Reports no chest pain, pressure, or tightness. No edema, orthopnea, PND. Reports no palpitations.   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 Left ventricular ejection fraction, by estimation, is 60 to 65%. The  left ventricle has normal function. The left ventricle has no regional  wall motion abnormalities. Left ventricular diastolic parameters are  indeterminate. The average left  ventricular global longitudinal strain is -21.3 %. The global longitudinal  strain is normal.   2. Right ventricular systolic function is normal. The right ventricular  size is normal.   3. The mitral valve is normal in structure. Mild mitral valve  regurgitation. No evidence of mitral stenosis.   4. The aortic valve is tricuspid. Aortic valve regurgitation is trivial.  Aortic valve sclerosis is present, with no evidence of aortic valve  stenosis.   5. The inferior vena cava is normal in size with greater than 50%  respiratory variability, suggesting right atrial pressure of 3 mmHg.   Comparison(s): No significant change from prior study.   FINDINGS   Left Ventricle: Left ventricular ejection fraction, by estimation, is 60  to 65%. The left ventricle has normal function. The left ventricle has no  regional wall motion abnormalities. The average left ventricular global  longitudinal strain is -21.3 %.  The global longitudinal strain is normal. The left ventricular internal  cavity size was normal in size. There is no left ventricular hypertrophy.  Left ventricular diastolic parameters are indeterminate.    Right Ventricle: The right ventricular size is normal. Right ventricular  systolic function is normal.   Left Atrium: Left atrial size was normal in size.   Right Atrium: Right atrial size was normal in size.   Pericardium: There is no evidence of pericardial effusion.   Mitral Valve: The mitral valve is normal in structure. Mild mitral valve  regurgitation. No evidence of mitral valve stenosis.   Tricuspid Valve: The tricuspid valve is normal in structure. Tricuspid  valve regurgitation is not demonstrated. No evidence of tricuspid  stenosis.   Aortic Valve: The aortic valve is tricuspid. Aortic valve regurgitation is  trivial. Aortic regurgitation PHT measures 1066 msec. Aortic valve  sclerosis is present, with no evidence of aortic valve stenosis.   Pulmonic Valve: The pulmonic valve was normal in structure.  Pulmonic valve  regurgitation is not visualized. No evidence of pulmonic stenosis.   Aorta: The aortic root is normal in size and structure.   Venous: The inferior vena cava is normal in size with greater than 50%  respiratory variability, suggesting right atrial pressure of 3 mmHg.   IAS/Shunts: No atrial level shunt detected by color flow Doppler.   Lexiscan Myoview 07/01/2019  Nuclear stress EF: 75%. The left ventricular ejection fraction is hyperdynamic (>65%). There was no ST segment deviation noted during stress. This is a low risk study. There is no evidence of ischemia or previous infarction. The study is normal.  Echocardiogram 04/28/20 IMPRESSIONS     1. Left ventricular ejection fraction, by estimation, is 60 to 65%. The  left ventricle has normal function. The left ventricle has no regional  wall motion abnormalities. Left ventricular diastolic parameters were  normal.   2. Right ventricular systolic function is normal. The right ventricular  size is normal. Tricuspid regurgitation signal is inadequate for assessing  PA pressure.   3. The mitral  valve is normal in structure. Trivial mitral valve  regurgitation.   4. The aortic valve is normal in structure. Aortic valve regurgitation is  trivial.   5. The inferior vena cava is dilated in size with >50% respiratory  variability, suggesting right atrial pressure of 8 mmHg.   Conclusion(s)/Recommendation(s): No vegetation seen but technically  difficult study with poor visualization of valves. If clinical suspicion  for endocarditis, would recommend TEE.   FINDINGS   Left Ventricle: Left ventricular ejection fraction, by estimation, is 60  to 65%. The left ventricle has normal function. The left ventricle has no  regional wall motion abnormalities. The left ventricular internal cavity  size was normal in size. There is   no left ventricular hypertrophy. Left ventricular diastolic parameters  were normal.   Right Ventricle: The right ventricular size is normal. No increase in  right ventricular wall thickness. Right ventricular systolic function is  normal. Tricuspid regurgitation signal is inadequate for assessing PA  pressure.   Pericardium: There is no evidence of pericardial effusion.   Mitral Valve: The mitral valve is normal in structure. Trivial mitral  valve regurgitation.   Tricuspid Valve: The tricuspid valve is normal in structure. Tricuspid  valve regurgitation is trivial.   Aortic Valve: The aortic valve is normal in structure. Aortic valve  regurgitation is trivial. Aortic regurgitation PHT measures 702 msec.   Aorta: The aortic root is normal in size and structure.   Venous: The inferior vena cava is dilated in size with greater than 50%  respiratory variability, suggesting right atrial pressure of 8 mmHg.   IAS/Shunts: The interatrial septum was not well visualized.   EKG:  EKG is  ordered today.  The ekg ordered today demonstrates sinus bradycardia, rate 54 with first-degree AV block  Recent Labs: 07/18/2021: Hemoglobin 13.8; Platelets  212.0 01/16/2022: ALT 17; BUN 16; Creatinine, Ser 0.78; Potassium 4.6; Sodium 141  Recent Lipid Panel    Component Value Date/Time   CHOL 123 01/16/2022 0936   TRIG 115.0 01/16/2022 0936   HDL 35.90 (L) 01/16/2022 0936   CHOLHDL 3 01/16/2022 0936   VLDL 23.0 01/16/2022 0936   LDLCALC 64 01/16/2022 0936   LDLCALC 74 12/24/2019 0941   LDLDIRECT 76.0 01/03/2021 0921    Home Medications   Current Meds  Medication Sig   amLODipine (NORVASC) 5 MG tablet Take 1 tablet (5 mg total) by mouth daily.   ANORO ELLIPTA 62.5-25  MCG/ACT AEPB USE 1 INHALATION DAILY   aspirin 81 MG tablet Take 81 mg by mouth daily.   ELDERBERRY PO Take 1 tablet by mouth daily.   losartan (COZAAR) 50 MG tablet TAKE ONE (1) TABLET BY MOUTH EVERY DAY   Omega-3 Fatty Acids (FISH OIL) 1200 MG CAPS Take 1,200 mg by mouth daily.   omeprazole (PRILOSEC) 40 MG capsule TAKE ONE CAPSULE BY MOUTH DAILY   oxymetazoline (AFRIN) 0.05 % nasal spray Place 2 sprays into both nostrils 2 (two) times daily as needed for congestion.   rosuvastatin (CRESTOR) 10 MG tablet TAKE 1 TABLET(10 MG) BY MOUTH DAILY   vitamin B-12 (CYANOCOBALAMIN) 1000 MCG tablet Take 1,000-5,000 mcg by mouth daily.    vitamin C (ASCORBIC ACID) 500 MG tablet Take 500 mg by mouth daily.   vitamin E 400 UNIT capsule Take 1,200 Units by mouth daily.   Current Facility-Administered Medications for the 01/17/22 encounter (Office Visit) with Elgie Collard, PA-C  Medication   0.9 %  sodium chloride infusion     Review of Systems      All other systems reviewed and are otherwise negative except as noted above.  Physical Exam    VS:  BP (!) 142/90   Pulse 79   Ht '5\' 5"'$  (1.651 m)   Wt 194 lb 3.2 oz (88.1 kg)   SpO2 97%   BMI 32.32 kg/m  , BMI Body mass index is 32.32 kg/m.  Wt Readings from Last 3 Encounters:  01/17/22 194 lb 3.2 oz (88.1 kg)  01/16/22 194 lb 14.4 oz (88.4 kg)  12/20/21 194 lb 1.6 oz (88 kg)     GEN: Well nourished, well developed, in  no acute distress. HEENT: normal. Neck: Supple, no JVD, carotid bruits, or masses. Cardiac: RRR, no murmurs, rubs, or gallops. No clubbing, cyanosis, edema.  Radials/PT 2+ and equal bilaterally.  Respiratory:  Respirations regular and unlabored, clear to auscultation bilaterally. GI: Soft, nontender, nondistended. MS: No deformity or atrophy. Skin: Warm and dry, no rash. Neuro:  Strength and sensation are intact. Psych: Normal affect.  Assessment & Plan    Atypical chest pain -No further chest pain -reviewed most recent echocardiogram 2/22 and lexiscan 4/21 -echocardiogram was unremarkable 8/23 -Continue GDMT with amlodipine 5 mg daily, aspirin 81 mg daily, Cozaar 50 mg daily, Crestor 10 mg daily  CAD in native artery -  No evidence of ischemic disease on last Lexiscan -Echo 8/23 unremarkable -continue current medications  Hyperlipidemia -Recent LDL 64 which is at goal.  HDL 35.  Discussed increasing exercise to eventually be 30 minutes a day 5 days a week.  Triglycerides are now at goal, 115.  Continue Crestor 10 mg daily and fish oil 1200 mg daily  SOB -Echocardiogram ordered with normal EF and no significant valvular disease -may have to do with increased weight and Asthma/COPD -smoking cessation -increase activity level, discussing walking in the morning and evening when its cooler outside     Disposition: Follow up in 6 months with Sinclair Grooms, MD or APP.  Signed, Elgie Collard, PA-C 01/17/2022, 11:27 AM [place]

## 2022-01-17 ENCOUNTER — Encounter: Payer: Self-pay | Admitting: Physician Assistant

## 2022-01-17 ENCOUNTER — Ambulatory Visit: Payer: Medicare Other | Attending: Interventional Cardiology | Admitting: Physician Assistant

## 2022-01-17 VITALS — BP 142/90 | HR 79 | Ht 65.0 in | Wt 194.2 lb

## 2022-01-17 DIAGNOSIS — E785 Hyperlipidemia, unspecified: Secondary | ICD-10-CM

## 2022-01-17 DIAGNOSIS — R0789 Other chest pain: Secondary | ICD-10-CM | POA: Diagnosis not present

## 2022-01-17 DIAGNOSIS — I2510 Atherosclerotic heart disease of native coronary artery without angina pectoris: Secondary | ICD-10-CM

## 2022-01-17 DIAGNOSIS — R0602 Shortness of breath: Secondary | ICD-10-CM

## 2022-01-17 NOTE — Patient Instructions (Signed)
Medication Instructions:  Your physician recommends that you continue on your current medications as directed. Please refer to the Current Medication list given to you today.  *If you need a refill on your cardiac medications before your next appointment, please call your pharmacy*   Lab Work: None If you have labs (blood work) drawn today and your tests are completely normal, you will receive your results only by: [place] (if you have MyChart) OR A paper copy in the mail If you have any lab test that is abnormal or we need to change your treatment, we will call you to review the results.   Follow-Up: At [facility], you and your health needs are our priority.  As part of our continuing mission to provide you with exceptional heart care, we have created designated Provider Care Teams.  These Care Teams include your primary Cardiologist (physician) and Advanced Practice Providers (APPs -  Physician Assistants and Nurse Practitioners) who all work together to provide you with the care you need, when you need it.   Your next appointment:   6 month(s)  Important Information About Sugar

## 2022-01-28 ENCOUNTER — Other Ambulatory Visit: Payer: Self-pay | Admitting: Family Medicine

## 2022-01-28 DIAGNOSIS — K219 Gastro-esophageal reflux disease without esophagitis: Secondary | ICD-10-CM

## 2022-01-30 ENCOUNTER — Other Ambulatory Visit: Payer: Self-pay | Admitting: Family Medicine

## 2022-02-01 DIAGNOSIS — J029 Acute pharyngitis, unspecified: Secondary | ICD-10-CM | POA: Diagnosis not present

## 2022-02-14 ENCOUNTER — Telehealth: Payer: Self-pay | Admitting: Family Medicine

## 2022-02-14 NOTE — Telephone Encounter (Signed)
Left message for patient to call back and schedule Medicare Annual Wellness Visit (AWV) either virtually or in office. Left  my Herbie Drape number [phone]   AWV-I PER [facility] 05/26/15 please schedule with Nurse Health Adviser   45 min for awv-i and in office appointments 30 min for awv-s  phone/virtual appointments

## 2022-03-01 ENCOUNTER — Ambulatory Visit (HOSPITAL_BASED_OUTPATIENT_CLINIC_OR_DEPARTMENT_OTHER): Payer: Medicare Other

## 2022-03-07 ENCOUNTER — Ambulatory Visit (HOSPITAL_BASED_OUTPATIENT_CLINIC_OR_DEPARTMENT_OTHER)
Admission: RE | Admit: 2022-03-07 | Discharge: 2022-03-07 | Disposition: A | Discharge status: Home / Self Care | Payer: Medicare Other | Source: Ambulatory Visit | Attending: Acute Care | Admitting: Acute Care

## 2022-03-07 DIAGNOSIS — Z87891 Personal history of nicotine dependence: Secondary | ICD-10-CM | POA: Diagnosis not present

## 2022-03-07 DIAGNOSIS — F17210 Nicotine dependence, cigarettes, uncomplicated: Secondary | ICD-10-CM | POA: Insufficient documentation

## 2022-03-09 ENCOUNTER — Other Ambulatory Visit: Payer: Self-pay

## 2022-03-09 DIAGNOSIS — Z87891 Personal history of nicotine dependence: Secondary | ICD-10-CM

## 2022-03-09 DIAGNOSIS — F17210 Nicotine dependence, cigarettes, uncomplicated: Secondary | ICD-10-CM

## 2022-03-29 ENCOUNTER — Other Ambulatory Visit: Payer: Self-pay | Admitting: Pulmonary Disease

## 2022-04-04 ENCOUNTER — Other Ambulatory Visit: Payer: Self-pay | Admitting: Family Medicine

## 2022-04-04 DIAGNOSIS — K219 Gastro-esophageal reflux disease without esophagitis: Secondary | ICD-10-CM

## 2022-04-06 ENCOUNTER — Other Ambulatory Visit: Payer: Self-pay | Admitting: Family Medicine

## 2022-04-06 DIAGNOSIS — K219 Gastro-esophageal reflux disease without esophagitis: Secondary | ICD-10-CM

## 2022-04-08 ENCOUNTER — Other Ambulatory Visit: Payer: Self-pay | Admitting: Family Medicine

## 2022-04-28 ENCOUNTER — Ambulatory Visit (INDEPENDENT_AMBULATORY_CARE_PROVIDER_SITE_OTHER): Payer: Medicare Other | Admitting: Family Medicine

## 2022-04-28 ENCOUNTER — Encounter: Payer: Self-pay | Admitting: Family Medicine

## 2022-04-28 VITALS — BP 138/80 | HR 77 | Temp 98.2°F | Ht 65.0 in | Wt 182.4 lb

## 2022-04-28 DIAGNOSIS — R739 Hyperglycemia, unspecified: Secondary | ICD-10-CM | POA: Diagnosis not present

## 2022-04-28 DIAGNOSIS — I10 Essential (primary) hypertension: Secondary | ICD-10-CM | POA: Diagnosis not present

## 2022-04-28 DIAGNOSIS — E785 Hyperlipidemia, unspecified: Secondary | ICD-10-CM | POA: Diagnosis not present

## 2022-04-28 LAB — POCT GLYCOSYLATED HEMOGLOBIN (HGB A1C): Hemoglobin A1C: 6 % — AB (ref 4.0–5.6)

## 2022-04-28 NOTE — Progress Notes (Signed)
Established Patient Office Visit  Subjective   Patient ID: Martin Navarro, male    DOB: 11-Dec-1950  Age: 72 y.o. MRN: [id number]  Chief Complaint  Patient presents with   Medical Management of Chronic Issues    HPI   Tayshawn is seen for follow-up regarding type 2 diabetes range blood sugars recently.  He had A1c of 6.8%.  We initially suggested starting metformin.  He declined with desire to try to manage this without additional medications first.  He basically has eliminated almost completely desserts like ice cream and cookies that he was consuming fairly regularly.  He has lost about 12 pounds since he was here previously.  Not monitoring blood sugars regularly.  No polyuria or polydipsia.  He has hypertension which is treated with losartan and amlodipine.  He states he usually takes both these medications at night and fell asleep early and did not take either 1 last night.  Also, states he had a ham biscuit on the way here this morning which he thinks may have exacerbated his blood pressure.  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  No myalgias.  Recent total cholesterol 123 with HDL 35 and LDL 64.  Denies recent chest pain.  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 He has a 106.00 pack-year smoking history. He has quit using smokeless tobacco. He reports that he does not currently use drugs. He reports that he does not drink alcohol. family history includes Heart disease (age of onset: 67) in his father; Hypertension in his father. Allergies  Allergen Reactions   Sulfa Antibiotics     Unknown    Review of Systems  Constitutional:  Negative for malaise/fatigue.  Eyes:  Negative for blurred vision.  Respiratory:  Negative for shortness of breath.   Cardiovascular:  Negative for chest pain.  Gastrointestinal:  Negative for abdominal pain.  Neurological:  Negative for dizziness, weakness and headaches.      Objective:     BP 138/80 (BP Location: Left Arm, Cuff Size: Normal)   Pulse 77   Temp 98.2 F (36.8 C) (Oral)   Ht '5\' 5"'$  (1.651 m)   Wt 182 lb 6.4 oz (82.7 kg)   SpO2 98%   BMI 30.35 kg/m    Physical Exam Vitals reviewed.  Constitutional:      Appearance: He is well-developed.  Eyes:     Pupils: Pupils are equal, round, and  reactive to light.  Neck:     Thyroid: No thyromegaly.  Cardiovascular:     Rate and Rhythm: Normal rate and regular rhythm.  Pulmonary:     Effort: Pulmonary effort is normal. No respiratory distress.     Breath sounds: Normal breath sounds. No wheezing or rales.  Musculoskeletal:     Cervical back: Neck supple.     Right lower leg: No edema.     Left lower leg: No edema.  Neurological:     Mental Status: He is alert and oriented to person, place, and time.      Results for orders placed or performed in visit on 04/28/22  POC HgB A1c  Result Value Ref Range   Hemoglobin A1C 6.0 (A) 4.0 - 5.6 %   HbA1c POC (<> result, manual entry)     HbA1c, POC (prediabetic range)     HbA1c, POC (controlled diabetic range)         The ASCVD Risk score (Arnett DK, et al., 2019) failed to calculate for the following reasons:   The valid total cholesterol range is 130 to 320 mg/dL    Assessment & Plan:   #1 hyperglycemia.  He had recent diabetes range blood sugars with A1c 6.8%.  He is an excellent job with lifestyle modification is lost 12 pounds largely through reducing snacking and desserts.  A1c today down to 6.0%.  Continue low glycemic diet.  Reassess A1c and about 4 months  #2 hypertension.  Slightly up today especially with his initial reading.  However, did not take medication last night and had high sodium breakfast on the way here.  Get back on his usual medications and monitor closely  #3 hyperlipidemia treated with Crestor.  Recent lipids stable.  Continue Crestor 10 mg daily.  Carolann Littler, MD

## 2022-05-10 ENCOUNTER — Telehealth: Payer: Self-pay | Admitting: Family Medicine

## 2022-05-10 NOTE — Telephone Encounter (Signed)
Left message for patient to call back and schedule Medicare Annual Wellness Visit (AWV) either virtually or in office. Left  my Herbie Drape number [phone]   Eden Lathe 05/26/15 per [place]  please schedule with Nurse Health Adviser  30 min for all AWV appointments

## 2022-05-23 DIAGNOSIS — R051 Acute cough: Secondary | ICD-10-CM | POA: Diagnosis not present

## 2022-05-24 DIAGNOSIS — H903 Sensorineural hearing loss, bilateral: Secondary | ICD-10-CM | POA: Diagnosis not present

## 2022-06-14 DIAGNOSIS — R509 Fever, unspecified: Secondary | ICD-10-CM | POA: Diagnosis not present

## 2022-07-13 ENCOUNTER — Other Ambulatory Visit: Payer: Self-pay | Admitting: Family Medicine

## 2022-07-13 DIAGNOSIS — K219 Gastro-esophageal reflux disease without esophagitis: Secondary | ICD-10-CM

## 2022-07-28 ENCOUNTER — Telehealth: Payer: Self-pay | Admitting: Family Medicine

## 2022-07-28 NOTE — Telephone Encounter (Signed)
Called patient to schedule Medicare Annual Wellness Visit (AWV). Unable to reach patient.  Last date of AWV:  AWV-I PER PA*LMETTO 05/26/15  Please schedule an appointment at any time with [facility] or [employer].  If any questions, please contact me at [phone].  Thank you ,  Rudell Cobb AWV direct phone # [phone]  Several attempts made to contact patient  lm 07/28/22 05/10/22 02/14/22 01/11/22 12/05/21 09/12/21 08/15/21 07/19/21 06/24/21 03/08/21  08/06/20  *LM 01/15/20; AWV-I PER PA*LMETTO 05/26/15---APPT 06/02/20

## 2022-07-30 NOTE — Progress Notes (Unsigned)
Office Visit    Patient Name: [name] Date of Encounter: 07/31/2022  PCP:  Martin Covey, MD   [facility]  Cardiologist:  Meriam Sprague, MD  Advanced Practice Provider:  No care team member to display Electrophysiologist:  None   HPI    Martin Navarro is a 72 y.o. male with a hx of chest pain, asthma, aortic atherosclerosis by CT, tobacco abuse and family history of CAD presents today for overdue follow-up appointment.  The patient was last seen by Dr. Katrinka Blazing 06/20/2019.  He has always had intermittent cardiovascular assessments due to his significant paternal family history of CAD involving his father and his grandmother.  Both had coronary artery bypass grafting.  His father also had a stroke.  He was seen by Dr. Donnie Aho in the past.  Lipid therapy has been advised.  He has had at least 2 nuclear perfusion studies, most recently in 2018 that was unremarkable.  Several lung cancer CT scans have demonstrated coronary/aortic atherosclerosis.  At his last appointment he was experiencing a tingling feeling in his left pectoral area.  It occurred randomly.  It was not precipitated by any physical activity.  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  He gets greater than 30 minutes of moderate activity per day.  Discomfort occurs randomly.  It may last 15 to 20 minutes before going away.  He was last seen by me 10/10/2021 and at that time he was having more shortness of breath.  He has noticed over the past couple of months that he feels more short of breath with activity than he had in the past.  He states that he recently was started on trelegy elliptica and it has improved his breathing.  He also shares that he has gained some weight over the past couple of months.  He does stay very active throughout the day but uses a golf cart to get around.  We discussed increasing his cardiovascular activity and  trying to take walks in the morning or in the evenings.  His blood pressure is slightly elevated today in the office.  He is encouraged to take his blood pressure at home.  Although his shortness of breath may be due to multiple factors, we discussed getting an echocardiogram to rule out worsening heart function.  I also reviewed his lipid panel and requested that his PCP send [place] a copy of his updated lipid panel that he will get done in October.  He also states that he has experienced some numbness in his feet especially when sitting for prolonged periods of time.  He was seen by me in Oct 2023, he feels good today. He did have some weight gain recently which may be contributing to his shortness of breath.  We discussed increasing exercise.  He states he is active throughout the day but does not have any structured exercise plan at that time.  We reviewed his most recent lab work done at his primary care.  Blood pressure is slightly elevated today at 142/90.  He tells me that at home it is usually much lower 120/70.  He is encouraged to continue taking his blood pressure at home.  Recently amlodipine was added to his regimen for better control.  His primary also wanted to start him on metformin since his A1c is now 6.8.  He would rather work on dietary changes then start a new  medication.  We discussed a low-sodium diet as well for his blood pressure.  Today, he is here with his daughter today.  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  He had some pain in his left arm other day but this has subsided.  Likely musculoskeletal.  No tightness in his chest.  No shortness of breath, dizziness, or lightheadedness.  He has lost about 15 pounds since his visit with me in the fall.  He is trying to get better control of his blood glucose so that he does not have to be on another medicine.  Since he is doing so well we had planned to move them out to annual visits.  Reports no  shortness of breath nor dyspnea on exertion. Reports no chest pain, pressure, or tightness. No edema, orthopnea, PND. Reports no palpitations.    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 Left ventricular ejection fraction, by estimation, is 60 to 65%. The  left ventricle has normal function. The left ventricle has no regional  wall motion abnormalities. Left ventricular diastolic parameters are  indeterminate. The average left  ventricular global longitudinal strain is -21.3 %. The global longitudinal  strain is normal.   2. Right ventricular systolic function is normal. The right ventricular  size is normal.   3. The mitral valve is normal in structure. Mild mitral valve  regurgitation. No evidence of mitral stenosis.   4. The aortic valve is tricuspid. Aortic valve regurgitation is trivial.  Aortic valve sclerosis is present, with no evidence of aortic valve  stenosis.   5. The inferior vena cava is normal in size with  greater than 50%  respiratory variability, suggesting right atrial pressure of 3 mmHg.   Comparison(s): No significant change from prior study.   FINDINGS   Left Ventricle: Left ventricular ejection fraction, by estimation, is 60  to 65%. The left ventricle has normal function. The left ventricle has no  regional wall motion abnormalities. The average left ventricular global  longitudinal strain is -21.3 %.  The global longitudinal strain is normal. The left ventricular internal  cavity size was normal in size. There is no left ventricular hypertrophy.  Left ventricular diastolic parameters are indeterminate.   Right Ventricle: The right ventricular size is normal. Right ventricular  systolic function is normal.   Left Atrium: Left atrial size was normal in size.   Right Atrium: Right atrial size was normal in size.   Pericardium: There is no evidence of pericardial effusion.   Mitral Valve: The mitral valve is normal in structure. Mild mitral valve  regurgitation. No evidence of mitral valve stenosis.   Tricuspid Valve: The tricuspid valve is normal in structure. Tricuspid  valve regurgitation is not demonstrated. No evidence of tricuspid  stenosis.   Aortic Valve: The aortic valve is tricuspid. Aortic valve regurgitation is  trivial. Aortic regurgitation PHT measures 1066 msec. Aortic valve  sclerosis is present, with no evidence of aortic valve stenosis.   Pulmonic Valve: The pulmonic valve was normal in structure. Pulmonic valve  regurgitation is not visualized. No evidence of pulmonic stenosis.   Aorta: The aortic root is normal in size and structure.   Venous: The inferior vena cava is normal in size with greater than 50%  respiratory variability, suggesting right atrial pressure of 3 mmHg.   IAS/Shunts: No atrial level shunt detected by color flow Doppler.   Lexiscan Myoview 07/01/2019  Nuclear stress EF: 75%. The left ventricular ejection fraction is hyperdynamic  (>65%). There was no ST segment deviation noted during stress. This is a low risk study. There is no evidence of ischemia or previous infarction. The study is normal.  Echocardiogram 04/28/20 IMPRESSIONS     1. Left ventricular ejection fraction, by estimation, is 60 to 65%. The  left ventricle has normal function. The left ventricle has no regional  wall motion abnormalities. Left ventricular diastolic parameters were  normal.   2. Right ventricular systolic function is normal. The right ventricular  size is normal. Tricuspid regurgitation signal is inadequate for assessing  PA pressure.   3. The mitral valve is normal in structure. Trivial mitral valve  regurgitation.   4. The aortic valve is normal in structure. Aortic valve regurgitation is  trivial.   5. The inferior vena cava is dilated in size with >50% respiratory  variability, suggesting right atrial pressure of 8 mmHg.   Conclusion(s)/Recommendation(s): No vegetation seen but technically  difficult study with poor visualization of valves. If clinical suspicion  for endocarditis, would recommend TEE.   FINDINGS   Left Ventricle: Left ventricular ejection fraction, by estimation, is 60  to 65%. The left ventricle has normal function. The left ventricle has no  regional wall motion abnormalities. The left ventricular internal cavity  size was normal in size. There is   no left ventricular hypertrophy. Left ventricular diastolic parameters  were normal.   Right Ventricle: The right ventricular size is normal. No increase in  right ventricular wall thickness. Right ventricular systolic function is  normal. Tricuspid regurgitation signal is inadequate for assessing PA  pressure.   Pericardium: There is no evidence of pericardial effusion.   Mitral Valve: The mitral valve is normal in structure. Trivial mitral  valve regurgitation.   Tricuspid Valve: The tricuspid valve is normal in structure. Tricuspid  valve  regurgitation is trivial.   Aortic Valve: The aortic valve is normal in structure. Aortic valve  regurgitation is trivial. Aortic regurgitation PHT measures 702 msec.   Aorta: The aortic root is normal in size and structure.   Venous: The inferior vena cava is dilated in size with greater than 50%  respiratory variability, suggesting right atrial pressure of 8 mmHg.   IAS/Shunts: The interatrial septum was not well visualized.   EKG:  EKG is  ordered  today.  The ekg ordered today demonstrates sinus bradycardia, rate 54 with first-degree AV block  Recent Labs: 01/16/2022: ALT 17; BUN 16; Creatinine, Ser 0.78; Potassium 4.6; Sodium 141  Recent Lipid Panel    Component Value Date/Time   CHOL 123 01/16/2022 0936   TRIG 115.0 01/16/2022 0936   HDL 35.90 (L) 01/16/2022 0936   CHOLHDL 3 01/16/2022 0936   VLDL 23.0 01/16/2022 0936   LDLCALC 64 01/16/2022 0936   LDLCALC 74 12/24/2019 0941   LDLDIRECT 76.0 01/03/2021 0921    Home Medications   Current Meds  Medication Sig   amLODipine (NORVASC) 5 MG tablet Take 1 tablet (5 mg total) by mouth daily.   ANORO ELLIPTA 62.5-25 MCG/ACT AEPB USE 1 INHALATION DAILY   aspirin 81 MG tablet Take 81 mg by mouth daily.   ELDERBERRY PO Take 1 tablet by mouth daily.   losartan (COZAAR) 50 MG tablet TAKE ONE (1) TABLET BY MOUTH EVERY DAY   Omega-3 Fatty Acids (FISH OIL) 1200 MG CAPS Take 1,200 mg by mouth daily.   omeprazole (PRILOSEC) 40 MG capsule TAKE ONE CAPSULE BY MOUTH DAILY   oxymetazoline (AFRIN) 0.05 % nasal spray Place 2 sprays into both nostrils 2 (two) times daily as needed for congestion.   rosuvastatin (CRESTOR) 10 MG tablet TAKE ONE (1) TABLET BY MOUTH EVERY DAY   vitamin B-12 (CYANOCOBALAMIN) 1000 MCG tablet Take 1,000-5,000 mcg by mouth daily.    vitamin C (ASCORBIC ACID) 500 MG tablet Take 500 mg by mouth daily.   vitamin E 400 UNIT capsule Take 1,200 Units by mouth daily.   Current Facility-Administered Medications for the  07/31/22 encounter (Office Visit) with Sharlene Dory, PA-C  Medication   0.9 %  sodium chloride infusion     Review of Systems      All other systems reviewed and are otherwise negative except as noted above.  Physical Exam    VS:  BP 124/70   Pulse 65   Ht 5\' 5"  (1.651 m)   Wt 179 lb 6.4 oz (81.4 kg)   SpO2 97%   BMI 29.85 kg/m  , BMI Body mass index is 29.85 kg/m.  Wt Readings from Last 3 Encounters:  07/31/22 179 lb 6.4 oz (81.4 kg)  04/28/22 182 lb 6.4 oz (82.7 kg)  03/07/22 180 lb (81.6 kg)     GEN: Well nourished, well developed, in no acute distress. HEENT: normal. Neck: Supple, no JVD, carotid bruits, or masses. Cardiac: RRR, no murmurs, rubs, or gallops. No clubbing, cyanosis, edema.  Radials/PT 2+ and equal bilaterally.  Respiratory:  Respirations regular and unlabored, clear to auscultation bilaterally. GI: Soft, nontender, nondistended. MS: No deformity or atrophy. Skin: Warm and dry, no rash. Neuro:  Strength and sensation are intact. Psych: Normal affect.  Assessment & Plan    Atypical chest pain -No further chest pain -reviewed most recent echocardiogram 2/22 and lexiscan 4/21 -echocardiogram was unremarkable 8/23 -Continue GDMT with amlodipine 5 mg daily, aspirin 81 mg daily, Cozaar 50 mg daily, Crestor 10 mg daily, he can increase fish oil to 2,000 daily  CAD in native artery -  No evidence of ischemic disease on last Lexiscan -Echo 8/23 unremarkable -continue current medications including ASA 81mg  daily  Hyperlipidemia -Recent LDL 64 which is at goal.  HDL 35.  Discussed increasing exercise to eventually be 30 minutes a day 5 days a week.  Triglycerides are now at goal, 115.  Continue Crestor 10 mg daily and fish oil 2,000  mg daily -repeat lipid panel in Oct   SOB-resolved -Echocardiogram ordered with normal EF and no significant valvular disease -may have to do with increased weight and Asthma/COPD -smoking cessation -increase activity  level, discussing walking in the morning and evening when its cooler outside     Disposition: Follow up in 1 year with Meriam Sprague, MD or APP.  Signed, Sharlene Dory, PA-C 07/31/2022, 11:29 AM [facility]

## 2022-07-31 ENCOUNTER — Encounter: Payer: Self-pay | Admitting: Physician Assistant

## 2022-07-31 ENCOUNTER — Ambulatory Visit: Payer: Medicare Other | Attending: Physician Assistant | Admitting: Physician Assistant

## 2022-07-31 VITALS — BP 124/70 | HR 65 | Ht 65.0 in | Wt 179.4 lb

## 2022-07-31 DIAGNOSIS — R0602 Shortness of breath: Secondary | ICD-10-CM | POA: Diagnosis not present

## 2022-07-31 DIAGNOSIS — I10 Essential (primary) hypertension: Secondary | ICD-10-CM | POA: Diagnosis not present

## 2022-07-31 DIAGNOSIS — I2510 Atherosclerotic heart disease of native coronary artery without angina pectoris: Secondary | ICD-10-CM | POA: Diagnosis not present

## 2022-07-31 DIAGNOSIS — E785 Hyperlipidemia, unspecified: Secondary | ICD-10-CM | POA: Diagnosis not present

## 2022-07-31 NOTE — Patient Instructions (Signed)
Medication Instructions:  Your physician recommends that you continue on your current medications as directed. Please refer to the Current Medication list given to you today.  *If you need a refill on your cardiac medications before your next appointment, please call your pharmacy*  Lab Work: None ordered If you have labs (blood work) drawn today and your tests are completely normal, you will receive your results only by: MyChart Message (if you have MyChart) OR A paper copy in the mail If you have any lab test that is abnormal or we need to change your treatment, we will call you to review the results.  Follow-Up: At [facility], you and your health needs are our priority.  As part of our continuing mission to provide you with exceptional heart care, we have created designated Provider Care Teams.  These Care Teams include your primary Cardiologist (physician) and Advanced Practice Providers (APPs -  Physician Assistants and Nurse Practitioners) who all work together to provide you with the care you need, when you need it.   Your next appointment:   1 year(s)  Provider:   Meriam Sprague, MD  or Jari Favre, PA-C      Other Instructions Low-Sodium Eating Plan Sodium, which is an element that makes up salt, helps you maintain a healthy balance of fluids in your body. Too much sodium can increase your blood pressure and cause fluid and waste to be held in your body. Your health care provider or dietitian may recommend following this plan if you have high blood pressure (hypertension), kidney disease, liver disease, or heart failure. Eating less sodium can help lower your blood pressure, reduce swelling, and protect your heart, liver, and kidneys. What are tips for following this plan? Reading food labels The Nutrition Facts label lists the amount of sodium in one serving of the food. If you eat more than one serving, you must multiply the listed amount of sodium by the number  of servings. Choose foods with less than 140 mg of sodium per serving. Avoid foods with 300 mg of sodium or more per serving. Shopping  Look for lower-sodium products, often labeled as "low-sodium" or "no salt added." Always check the sodium content, even if foods are labeled as "unsalted" or "no salt added." Buy fresh foods. Avoid canned foods and pre-made or frozen meals. Avoid canned, cured, or processed meats. Buy breads that have less than 80 mg of sodium per slice. Cooking  Eat more home-cooked food and less restaurant, buffet, and fast food. Avoid adding salt when cooking. Use salt-free seasonings or herbs instead of table salt or sea salt. Check with your health care provider or pharmacist before using salt substitutes. Cook with plant-based oils, such as canola, sunflower, or olive oil. Meal planning When eating at a restaurant, ask that your food be prepared with less salt or no salt, if possible. Avoid dishes labeled as brined, pickled, cured, smoked, or made with soy sauce, miso, or teriyaki sauce. Avoid foods that contain MSG (monosodium glutamate). MSG is sometimes added to [place] food, bouillon, and some canned foods. Make meals that can be grilled, baked, poached, roasted, or steamed. These are generally made with less sodium. General information Most people on this plan should limit their sodium intake to 1,500-2,000 mg (milligrams) of sodium each day. What foods should I eat? Fruits Fresh, frozen, or canned fruit. Fruit juice. Vegetables Fresh or frozen vegetables. "No salt added" canned vegetables. "No salt added" tomato sauce and paste. Low-sodium or  reduced-sodium tomato and vegetable juice. Grains Low-sodium cereals, including oats, puffed wheat and rice, and shredded wheat. Low-sodium crackers. Unsalted rice. Unsalted pasta. Low-sodium bread. Whole-grain breads and whole-grain pasta. Meats and other proteins Fresh or frozen (no salt added) meat, poultry, seafood,  and fish. Low-sodium canned tuna and salmon. Unsalted nuts. Dried peas, beans, and lentils without added salt. Unsalted canned beans. Eggs. Unsalted nut butters. Dairy Milk. Soy milk. Cheese that is naturally low in sodium, such as ricotta cheese, fresh mozzarella, or Swiss cheese. Low-sodium or reduced-sodium cheese. Cream cheese. Yogurt. Seasonings and condiments Fresh and dried herbs and spices. Salt-free seasonings. Low-sodium mustard and ketchup. Sodium-free salad dressing. Sodium-free light mayonnaise. Fresh or refrigerated horseradish. Lemon juice. Vinegar. Other foods Homemade, reduced-sodium, or low-sodium soups. Unsalted popcorn and pretzels. Low-salt or salt-free chips. The items listed above may not be a complete list of foods and beverages you can eat. Contact a dietitian for more information. What foods should I avoid? Vegetables Sauerkraut, pickled vegetables, and relishes. Olives. [place] fries. Onion rings. Regular canned vegetables (not low-sodium or reduced-sodium). Regular canned tomato sauce and paste (not low-sodium or reduced-sodium). Regular tomato and vegetable juice (not low-sodium or reduced-sodium). Frozen vegetables in sauces. Grains Instant hot cereals. Bread stuffing, pancake, and biscuit mixes. Croutons. Seasoned rice or pasta mixes. Noodle soup cups. Boxed or frozen macaroni and cheese. Regular salted crackers. Self-rising flour. Meats and other proteins Meat or fish that is salted, canned, smoked, spiced, or pickled. Precooked or cured meat, such as sausages or meat loaves. Martin Navarro. Ham. Pepperoni. Hot dogs. Corned beef. Chipped beef. Salt pork. Jerky. Pickled herring. Anchovies and sardines. Regular canned tuna. Salted nuts. Dairy Processed cheese and cheese spreads. Hard cheeses. Cheese curds. Blue cheese. Feta cheese. String cheese. Regular cottage cheese. Buttermilk. Canned milk. Fats and oils Salted butter. Regular margarine. Ghee. Bacon fat. Seasonings and  condiments Onion salt, garlic salt, seasoned salt, table salt, and sea salt. Canned and packaged gravies. Worcestershire sauce. Tartar sauce. Barbecue sauce. Teriyaki sauce. Soy sauce, including reduced-sodium. Steak sauce. Fish sauce. Oyster sauce. Cocktail sauce. Horseradish that you find on the shelf. Regular ketchup and mustard. Meat flavorings and tenderizers. Bouillon cubes. Hot sauce. Pre-made or packaged marinades. Pre-made or packaged taco seasonings. Relishes. Regular salad dressings. Salsa. Other foods Salted popcorn and pretzels. Corn chips and puffs. Potato and tortilla chips. Canned or dried soups. Pizza. Frozen entrees and pot pies. The items listed above may not be a complete list of foods and beverages you should avoid. Contact a dietitian for more information. Summary Eating less sodium can help lower your blood pressure, reduce swelling, and protect your heart, liver, and kidneys. Most people on this plan should limit their sodium intake to 1,500-2,000 mg (milligrams) of sodium each day. Canned, boxed, and frozen foods are high in sodium. Restaurant foods, fast foods, and pizza are also very high in sodium. You also get sodium by adding salt to food. Try to cook at home, eat more fresh fruits and vegetables, and eat less fast food and canned, processed, or prepared foods. This information is not intended to replace advice given to you by your health care provider. Make sure you discuss any questions you have with your health care provider. Document Revised: 02/17/2019 Document Reviewed: 02/12/2019 Elsevier Patient Education  2023 Elsevier Inc.   Heart-Healthy Eating Plan Many factors influence your heart health, including eating and exercise habits. Heart health is also called coronary health. Coronary risk increases with abnormal blood fat (lipid) levels. A heart-healthy  eating plan includes limiting unhealthy fats, increasing healthy fats, limiting salt (sodium) intake, and  making other diet and lifestyle changes. What is my plan? Your health care provider may recommend that: You limit your fat intake to _________% or less of your total calories each day. You limit your saturated fat intake to _________% or less of your total calories each day. You limit the amount of cholesterol in your diet to less than _________ mg per day. You limit the amount of sodium in your diet to less than _________ mg per day. What are tips for following this plan? Cooking Cook foods using methods other than frying. Baking, boiling, grilling, and broiling are all good options. Other ways to reduce fat include: Removing the skin from poultry. Removing all visible fats from meats. Steaming vegetables in water or broth. Meal planning  At meals, imagine dividing your plate into fourths: Fill one-half of your plate with vegetables and green salads. Fill one-fourth of your plate with whole grains. Fill one-fourth of your plate with lean protein foods. Eat 2-4 cups of vegetables per day. One cup of vegetables equals 1 cup (91 g) broccoli or cauliflower florets, 2 medium carrots, 1 large bell pepper, 1 large sweet potato, 1 large tomato, 1 medium white potato, 2 cups (150 g) raw leafy greens. Eat 1-2 cups of fruit per day. One cup of fruit equals 1 small apple, 1 large banana, 1 cup (237 g) mixed fruit, 1 large orange,  cup (82 g) dried fruit, 1 cup (240 mL) 100% fruit juice. Eat more foods that contain soluble fiber. Examples include apples, broccoli, carrots, beans, peas, and barley. Aim to get 25-30 g of fiber per day. Increase your consumption of legumes, nuts, and seeds to 4-5 servings per week. One serving of dried beans or legumes equals  cup (90 g) cooked, 1 serving of nuts is  oz (12 almonds, 24 pistachios, or 7 walnut halves), and 1 serving of seeds equals  oz (8 g). Fats Choose healthy fats more often. Choose monounsaturated and polyunsaturated fats, such as olive and canola  oils, avocado oil, flaxseeds, walnuts, almonds, and seeds. Eat more omega-3 fats. Choose salmon, mackerel, sardines, tuna, flaxseed oil, and ground flaxseeds. Aim to eat fish at least 2 times each week. Check food labels carefully to identify foods with trans fats or high amounts of saturated fat. Limit saturated fats. These are found in animal products, such as meats, butter, and cream. Plant sources of saturated fats include palm oil, palm kernel oil, and coconut oil. Avoid foods with partially hydrogenated oils in them. These contain trans fats. Examples are stick margarine, some tub margarines, cookies, crackers, and other baked goods. Avoid fried foods. General information Eat more home-cooked food and less restaurant, buffet, and fast food. Limit or avoid alcohol. Limit foods that are high in added sugar and simple starches such as foods made using white refined flour (white breads, pastries, sweets). Lose weight if you are overweight. Losing just 5-10% of your body weight can help your overall health and prevent diseases such as diabetes and heart disease. Monitor your sodium intake, especially if you have high blood pressure. Talk with your health care provider about your sodium intake. Try to incorporate more vegetarian meals weekly. What foods should I eat? Fruits All fresh, canned (in natural juice), or frozen fruits. Vegetables Fresh or frozen vegetables (raw, steamed, roasted, or grilled). Green salads. Grains Most grains. Choose whole wheat and whole grains most of the time. Rice and  pasta, including brown rice and pastas made with whole wheat. Meats and other proteins Lean, well-trimmed beef, veal, pork, and lamb. Chicken and [place] without skin. All fish and shellfish. Wild duck, rabbit, pheasant, and venison. Egg whites or low-cholesterol egg substitutes. Dried beans, peas, lentils, and tofu. Seeds and most nuts. Dairy Low-fat or nonfat cheeses, including ricotta and  mozzarella. Skim or 1% milk (liquid, powdered, or evaporated). Buttermilk made with low-fat milk. Nonfat or low-fat yogurt. Fats and oils Non-hydrogenated (trans-free) margarines. Vegetable oils, including soybean, sesame, sunflower, olive, avocado, peanut, safflower, corn, canola, and cottonseed. Salad dressings or mayonnaise made with a vegetable oil. Beverages Water (mineral or sparkling). Coffee and tea. Unsweetened ice tea. Diet beverages. Sweets and desserts Sherbet, gelatin, and fruit ice. Small amounts of dark chocolate. Limit all sweets and desserts. Seasonings and condiments All seasonings and condiments. The items listed above may not be a complete list of foods and beverages you can eat. Contact a dietitian for more options. What foods should I avoid? Fruits Canned fruit in heavy syrup. Fruit in cream or butter sauce. Fried fruit. Limit coconut. Vegetables Vegetables cooked in cheese, cream, or butter sauce. Fried vegetables. Grains Breads made with saturated or trans fats, oils, or whole milk. Croissants. Sweet rolls. Donuts. High-fat crackers, such as cheese crackers and chips. Meats and other proteins Fatty meats, such as hot dogs, ribs, sausage, bacon, rib-eye roast or steak. High-fat deli meats, such as salami and bologna. Caviar. Domestic duck and goose. Organ meats, such as liver. Dairy Cream, sour cream, cream cheese, and creamed cottage cheese. Whole-milk cheeses. Whole or 2% milk (liquid, evaporated, or condensed). Whole buttermilk. Cream sauce or high-fat cheese sauce. Whole-milk yogurt. Fats and oils Meat fat, or shortening. Cocoa butter, hydrogenated oils, palm oil, coconut oil, palm kernel oil. Solid fats and shortenings, including bacon fat, salt pork, lard, and butter. Nondairy cream substitutes. Salad dressings with cheese or sour cream. Beverages Regular sodas and any drinks with added sugar. Sweets and desserts Frosting. Pudding. Cookies. Cakes. Pies. Milk  chocolate or white chocolate. Buttered syrups. Full-fat ice cream or ice cream drinks. The items listed above may not be a complete list of foods and beverages to avoid. Contact a dietitian for more information. Summary Heart-healthy meal planning includes limiting unhealthy fats, increasing healthy fats, limiting salt (sodium) intake and making other diet and lifestyle changes. Lose weight if you are overweight. Losing just 5-10% of your body weight can help your overall health and prevent diseases such as diabetes and heart disease. Focus on eating a balance of foods, including fruits and vegetables, low-fat or nonfat dairy, lean protein, nuts and legumes, whole grains, and heart-healthy oils and fats. This information is not intended to replace advice given to you by your health care provider. Make sure you discuss any questions you have with your health care provider. Document Revised: 04/18/2021 Document Reviewed: 04/18/2021 Elsevier Patient Education  2023 [employer].

## 2022-08-10 ENCOUNTER — Other Ambulatory Visit: Payer: Self-pay | Admitting: Pulmonary Disease

## 2022-08-27 DIAGNOSIS — J209 Acute bronchitis, unspecified: Secondary | ICD-10-CM | POA: Diagnosis not present

## 2022-09-29 ENCOUNTER — Other Ambulatory Visit: Payer: Self-pay | Admitting: Pulmonary Disease

## 2022-11-01 DIAGNOSIS — D485 Neoplasm of uncertain behavior of skin: Secondary | ICD-10-CM | POA: Diagnosis not present

## 2022-11-01 DIAGNOSIS — Z85828 Personal history of other malignant neoplasm of skin: Secondary | ICD-10-CM | POA: Diagnosis not present

## 2022-11-01 DIAGNOSIS — L570 Actinic keratosis: Secondary | ICD-10-CM | POA: Diagnosis not present

## 2022-11-01 DIAGNOSIS — L821 Other seborrheic keratosis: Secondary | ICD-10-CM | POA: Diagnosis not present

## 2022-11-11 ENCOUNTER — Other Ambulatory Visit: Payer: Self-pay | Admitting: Pulmonary Disease

## 2022-11-15 ENCOUNTER — Encounter: Payer: Self-pay | Admitting: Pulmonary Disease

## 2022-11-16 MED ORDER — ANORO ELLIPTA 62.5-25 MCG/ACT IN AEPB: 1.0000 | INHALATION_SPRAY | Freq: Every day | RESPIRATORY_TRACT | 0 refills | Status: DC

## 2023-01-03 ENCOUNTER — Encounter: Payer: Self-pay | Admitting: Pulmonary Disease

## 2023-01-03 ENCOUNTER — Ambulatory Visit: Payer: Medicare Other | Admitting: Pulmonary Disease

## 2023-01-03 VITALS — BP 120/68 | HR 71 | Ht 63.0 in | Wt 173.2 lb

## 2023-01-03 DIAGNOSIS — J439 Emphysema, unspecified: Secondary | ICD-10-CM | POA: Diagnosis not present

## 2023-01-03 DIAGNOSIS — J4489 Other specified chronic obstructive pulmonary disease: Secondary | ICD-10-CM | POA: Diagnosis not present

## 2023-01-03 NOTE — Patient Instructions (Signed)
VISIT SUMMARY:  During your visit, we discussed your chronic obstructive pulmonary disease (COPD) and your current treatment with the Anoro inhaler. We also talked about the possibility of you starting the N-Style Systane supplement. Lastly, we discussed your upcoming physical examination and the importance of getting vaccinated.  YOUR PLAN:  -CHRONIC OBSTRUCTIVE PULMONARY DISEASE (COPD): COPD is a chronic lung disease that makes it hard to breathe. You are currently stable on the Anoro inhaler, which you switched to from Trelegy. You've noticed no significant difference between the two inhalers. We will continue with the Anoro inhaler and schedule your annual CT scan in December.  -SUPPLEMENT USE: You inquired about starting the  N acetyl cysteine supplement for respiratory health. While there's no data to support its use in COPD, it's likely not harmful. You may start this supplement if you wish.  -GENERAL HEALTH MAINTENANCE: You have an upcoming physical exam with your primary care provider. During this visit, it's recommended that you receive the flu vaccine, RSV vaccine, and COVID-19 vaccine. You should also discuss the potential need for pneumonia and shingles vaccines with your primary care provider.  INSTRUCTIONS:  Please continue using your Anoro inhaler as prescribed. If you decide to start the N-Style Systane supplement, please let us know. Remember to schedule your annual CT scan in December. During your upcoming physical exam, make sure to get your flu, RSV, and COVID-19 vaccines, and discuss the potential need for pneumonia and shingles vaccines with your primary care provider.

## 2023-01-03 NOTE — Progress Notes (Signed)
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  with a history of emphysema, HTN, HLD and recent COVID infection here for consultation after post-COVID changes and a recent lung cancer screening CT. patient was hospitalized for 5 days in January for COVID infection.  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  Patient reports occasional smoker's cough but denies any shortness of breath, dyspnea on exertion, chills, fevers, wheezing or chest pain.  Patient states he is back to his baseline.  Pets: He has had a cat for 4-5 years. 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 Exposures: No mold, hot or feather pillows or comforters Smoking history: Smoked for 50 years with 50-pack-year hx. Quit in 2017, then vaped for 5 years before quitting in 2022 Travel history: No significant travel history.  Relevant family history: No significant family hx.   Interim history: Discussed the use of AI scribe software for clinical note transcription with the patient, who gave verbal consent to proceed.  The patient, with a history of COPD, is accompanied by daughter who inquires about starting the patient on  N acetyl cysteine, an over-the-counter supplement, for potential respiratory benefits. The patient has not yet started this supplement. The patient is currently using an Anoro inhaler for COPD management, having previously switched from Trelegy due to concerns about increased pneumonia risk associated with the steroid component of Trelegy. The  patient reports no discernible difference between the two inhalers, suggesting that the steroid component may not be necessary for symptom control. The patient is due for a yearly CT scan in December. The patient also has a physical examination scheduled in the coming weeks, during which he may receive several vaccinations, including the flu vaccine, RSV, and possibly a pneumonia vaccine if it has been ten years since the last administration.   Outpatient Encounter Medications as of 01/03/2023  Medication Sig   amLODipine (NORVASC) 5 MG tablet Take 1 tablet (5 mg total) by mouth daily.   aspirin 81 MG tablet Take 81 mg by mouth daily.   ELDERBERRY PO Take 1 tablet by mouth daily.   losartan (COZAAR) 50 MG tablet TAKE ONE (1) TABLET BY MOUTH EVERY DAY   Omega-3 Fatty Acids (FISH OIL) 1200 MG CAPS Take 1,200 mg by mouth daily.   omeprazole (PRILOSEC) 40 MG capsule TAKE ONE CAPSULE BY MOUTH DAILY   oxymetazoline (AFRIN) 0.05 % nasal spray Place 2 sprays into both nostrils 2 (two) times daily as needed for congestion.   rosuvastatin (CRESTOR) 10 MG tablet TAKE ONE (1) TABLET BY MOUTH EVERY DAY   umeclidinium-vilanterol (ANORO ELLIPTA) 62.5-25 MCG/ACT AEPB Inhale 1 puff into the lungs daily.   vitamin B-12 (CYANOCOBALAMIN) 1000 MCG tablet Take 1,000-5,000 mcg by mouth daily.    vitamin C (ASCORBIC ACID) 500 MG tablet Take 500 mg by mouth daily.   vitamin E 400 UNIT capsule Take 1,200 Units by mouth daily.   Facility-Administered Encounter Medications as of 01/03/2023  Medication   0.9 %  sodium chloride infusion   Physical Exam: Blood  pressure 120/68, pulse 71, height 5\' 3"  (1.6 m), weight 173 lb 3.2 oz (78.6 kg), SpO2 97%. Gen:      No acute distress HEENT:  EOMI, sclera anicteric Neck:     No masses; no thyromegaly Lungs:    Clear to auscultation bilaterally; normal respiratory effort CV:         Regular rate and rhythm; no murmurs Abd:      + bowel sounds; soft, non-tender; no palpable  masses, no distension Ext:    No edema; adequate peripheral perfusion Skin:      Warm and dry; no rash Neuro: alert and oriented x 3 Psych: normal mood and affect   Data Reviewed: Imaging: CXR 05/05/2020: Multifocal peripheral and bibasilar pulmonary infiltrate 2/2 COVID-19 pneumonia CT chest lung cancer screening 06/21/2020: New quarters peripheral groundglass opacity with traction bronchiectasis and emphysematous changes.  High-res CT 03/01/2021-improvement in groundglass opacities, emphysema, bronchial wall thickening, coronary artery disease.   Screening CT chest 03/07/2022-emphysema, bronchial wall thickening, scattered subcentimeter pulmonary nodules which are stable I have reviewed the images personally.  Cardiac Echo 04/28/2020: EF 60 to 65%, no valvular abnormalities and no diastolic dysfunction  PFTs: 03/07/2021 FVC 3.42 [99%], FEV1 2.05 [82%], F/F 60, TLC 7.52 [129%], DLCO 13.37 [62%] Mild obstruction with overinflation and air trapping Moderate diffusion defect  Labs: CBC 01/03/2021-WBC 11.2, eos 2.3%, absolute eosinophil count 258   Assessment:  Chronic Obstructive Pulmonary Disease (COPD) Stable on Anoro inhaler. No significant difference noted by patient when switched from Trelegy (which contains a steroid) to Anoro. Lung sounds normal on examination. -Continue Anoro inhaler. -Annual CT scan due in December, to be scheduled by the office.  Supplement Use Inquiry about starting N acetyl cysteine, an over-the-counter supplement for respiratory health. No data to support its use in COPD, but likely not harmful. -May start  N acetyl cysteine supplement if desired.  General Health Maintenance Upcoming physical exam with primary care provider. Discussed importance of vaccinations. -Recommend receiving flu vaccine, RSV vaccine, and COVID-19 vaccine at upcoming physical. -Discuss potential need for pneumonia and shingles vaccines with primary care provider.   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  Plan/Recommendations: Franco Collet, annual CT chest  Chilton Greathouse MD [facility] 01/03/2023, 1:26 PM  CC: Kristian Covey, MD

## 2023-01-19 ENCOUNTER — Ambulatory Visit (INDEPENDENT_AMBULATORY_CARE_PROVIDER_SITE_OTHER): Payer: Medicare Other | Admitting: Family Medicine

## 2023-01-19 ENCOUNTER — Encounter: Payer: Self-pay | Admitting: Family Medicine

## 2023-01-19 VITALS — BP 140/74 | HR 60 | Temp 98.2°F | Ht 63.0 in | Wt 172.5 lb

## 2023-01-19 DIAGNOSIS — I10 Essential (primary) hypertension: Secondary | ICD-10-CM | POA: Diagnosis not present

## 2023-01-19 DIAGNOSIS — R739 Hyperglycemia, unspecified: Secondary | ICD-10-CM

## 2023-01-19 DIAGNOSIS — E785 Hyperlipidemia, unspecified: Secondary | ICD-10-CM

## 2023-01-19 DIAGNOSIS — K219 Gastro-esophageal reflux disease without esophagitis: Secondary | ICD-10-CM | POA: Diagnosis not present

## 2023-01-19 DIAGNOSIS — Z0000 Encounter for general adult medical examination without abnormal findings: Secondary | ICD-10-CM | POA: Diagnosis not present

## 2023-01-19 LAB — CBC WITH DIFFERENTIAL/PLATELET
Basophils Absolute: 0.1 10*3/uL (ref 0.0–0.1)
Basophils Relative: 0.9 % (ref 0.0–3.0)
Eosinophils Absolute: 0.3 10*3/uL (ref 0.0–0.7)
Eosinophils Relative: 3.2 % (ref 0.0–5.0)
HCT: 43.7 % (ref 39.0–52.0)
Hemoglobin: 14.3 g/dL (ref 13.0–17.0)
Lymphocytes Relative: 27.2 % (ref 12.0–46.0)
Lymphs Abs: 2.4 10*3/uL (ref 0.7–4.0)
MCHC: 32.7 g/dL (ref 30.0–36.0)
MCV: 95.5 fL (ref 78.0–100.0)
Monocytes Absolute: 1.1 10*3/uL — ABNORMAL HIGH (ref 0.1–1.0)
Monocytes Relative: 12.7 % — ABNORMAL HIGH (ref 3.0–12.0)
Neutro Abs: 5 10*3/uL (ref 1.4–7.7)
Neutrophils Relative %: 56 % (ref 43.0–77.0)
Platelets: 256 10*3/uL (ref 150.0–400.0)
RBC: 4.58 Mil/uL (ref 4.22–5.81)
RDW: 14.4 % (ref 11.5–15.5)
WBC: 8.9 10*3/uL (ref 4.0–10.5)

## 2023-01-19 LAB — LIPID PANEL
Cholesterol: 121 mg/dL (ref 0–200)
HDL: 39.7 mg/dL (ref >39.00)
LDL Cholesterol: 63 mg/dL (ref 0–99)
NonHDL: 81.26
Total CHOL/HDL Ratio: 3
Triglycerides: 91 mg/dL (ref 0.0–149.0)
VLDL: 18.2 mg/dL (ref 0.0–40.0)

## 2023-01-19 LAB — COMPREHENSIVE METABOLIC PANEL
ALT: 13 U/L (ref 0–53)
AST: 14 U/L (ref 0–37)
Albumin: 4 g/dL (ref 3.5–5.2)
Alkaline Phosphatase: 68 U/L (ref 39–117)
BUN: 11 mg/dL (ref 6–23)
CO2: 29 meq/L (ref 19–32)
Calcium: 9.3 mg/dL (ref 8.4–10.5)
Chloride: 106 meq/L (ref 96–112)
Creatinine, Ser: 0.86 mg/dL (ref 0.40–1.50)
GFR: 85.84 mL/min (ref >60.00)
Glucose, Bld: 108 mg/dL — ABNORMAL HIGH (ref 70–99)
Potassium: 4.7 meq/L (ref 3.5–5.1)
Sodium: 141 meq/L (ref 135–145)
Total Bilirubin: 0.8 mg/dL (ref 0.2–1.2)
Total Protein: 6.7 g/dL (ref 6.0–8.3)

## 2023-01-19 LAB — HEMOGLOBIN A1C: Hgb A1c MFr Bld: 6.1 % (ref 4.6–6.5)

## 2023-01-19 MED ORDER — LOSARTAN POTASSIUM 50 MG PO TABS: ORAL_TABLET | ORAL | 3 refills | Status: AC

## 2023-01-19 MED ORDER — OMEPRAZOLE 40 MG PO CPDR: 40.0000 mg | DELAYED_RELEASE_CAPSULE | Freq: Every day | ORAL | 3 refills | Status: DC

## 2023-01-19 MED ORDER — AMLODIPINE BESYLATE 5 MG PO TABS: 5.0000 mg | ORAL_TABLET | Freq: Every day | ORAL | 3 refills | Status: DC

## 2023-01-19 MED ORDER — ROSUVASTATIN CALCIUM 10 MG PO TABS: ORAL_TABLET | ORAL | 3 refills | Status: DC

## 2023-01-19 NOTE — Progress Notes (Signed)
Established Patient Office Visit  Subjective   Patient ID: Martin Navarro, male    DOB: [birth year]  Age: 72 y.o. MRN: [id number]  Chief Complaint  Patient presents with   Annual Exam    HPI   [place] is seen for physical exam.  He has history of hypertension, COPD, GERD, hyperlipidemia, ongoing nicotine use.  Followed by pulmonary.  He unfortunately has resorted back to smoking about a pack cigarettes per day.  Generally doing well.  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  He has had some intermittent fleeting right scrotal pains over the past several months.  No visible bulge.  No history of injury.  Health maintenance reviewed:  Health Maintenance  Topic Date Due   Medicare Annual Wellness (AWV)  Never done   Zoster Vaccines- Shingrix (1 of 2) 06/10/1999   COVID-19 Vaccine (3 - 2023-24 season) 11/26/2022   INFLUENZA VACCINE  06/25/2023 (Originally 10/26/2022)   Lung Cancer Screening  03/08/2023   DTaP/Tdap/Td (3 - Td or Tdap) 08/17/2030   Colonoscopy  05/31/2031   Pneumonia Vaccine 82+ Years old  Completed   Hepatitis C Screening  Completed   HPV VACCINES  Aged Out   -He declines flu vaccine. -He has had prior Zostavax and declines Shingrix at this time but will consider -Colonoscopy up-to-date -Pneumonia vaccines complete  Family history-mother is 59 with no active medical problems.  She lives independently.  His father died age 81.  His father had coronary disease age 40.  He has a sister who is 24 who has no known active medical problems.   Social history-divorced.  Has 1 daughter.  No grandchildren.  smoker.  Occasional marijuana use.  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 He started smoking about 60 years ago. He has a 106 pack-year smoking history. He has quit using smokeless tobacco. He reports that he does not currently use drugs. He reports that he does not drink alcohol. family history includes Heart disease (age of onset: 9) in his father; Hypertension in his father. Allergies  Allergen Reactions   Sulfa Antibiotics     Unknown    Review of Systems  Constitutional:  Negative for chills, fever, malaise/fatigue and weight loss.  HENT:  Negative for hearing loss.   Eyes:  Negative for blurred vision and double vision.  Respiratory:  Negative for cough and shortness  of breath.   Cardiovascular:  Negative for chest pain, palpitations and leg swelling.  Gastrointestinal:  Negative for abdominal pain, blood in stool, constipation and diarrhea.  Genitourinary:  Negative for dysuria.  Skin:  Negative for rash.  Neurological:  Negative for dizziness, speech change, seizures, loss of consciousness and headaches.  Psychiatric/Behavioral:  Negative for depression.       Objective:     BP (!) 140/74 (BP Location: Left Arm, Patient Position: Sitting, Cuff Size: Normal)   Pulse 60   Temp 98.2 F (36.8 C) (Oral)   Ht 5\' 3"  (1.6 m)   Wt 172 lb 8 oz (78.2 kg)   SpO2 98%   BMI 30.56 kg/m  BP Readings from Last 3 Encounters:   01/19/23 (!) 140/74  01/03/23 120/68  07/31/22 124/70   Wt Readings from Last 3 Encounters:  01/19/23 172 lb 8 oz (78.2 kg)  01/03/23 173 lb 3.2 oz (78.6 kg)  07/31/22 179 lb 6.4 oz (81.4 kg)      Physical Exam Vitals reviewed.  Constitutional:      General: He is not in acute distress.    Appearance: He is well-developed.  HENT:     Head: Normocephalic and atraumatic.     Right Ear: External ear normal.     Left Ear: External ear normal.  Eyes:     Conjunctiva/sclera: Conjunctivae normal.     Pupils: Pupils are equal, round, and reactive to light.  Neck:     Thyroid: No thyromegaly.  Cardiovascular:     Rate and Rhythm: Normal rate and regular rhythm.     Heart sounds: Normal heart sounds. No murmur heard. Pulmonary:     Effort: No respiratory distress.     Breath sounds: No wheezing or rales.  Abdominal:     General: Bowel sounds are normal. There is no distension.     Palpations: Abdomen is soft. There is no mass.     Tenderness: There is no abdominal tenderness. There is no guarding or rebound.  Genitourinary:    Comments: Atrophic testicles.  No scrotal masses palpated-other than slight bulge with cough from possible early hernia Musculoskeletal:     Cervical back: Normal range of motion and neck supple.  Lymphadenopathy:     Cervical: No cervical adenopathy.  Skin:    Findings: No rash.  Neurological:     Mental Status: He is alert and oriented to person, place, and time.     Cranial Nerves: No cranial nerve deficit.     Deep Tendon Reflexes: Reflexes normal.      No results found for any visits on 01/19/23.    The ASCVD Risk score (Arnett DK, et al., 2019) failed to calculate for the following reasons:   The valid total cholesterol range is 130 to 320 mg/dL    Assessment & Plan:   Problem List Items Addressed This Visit       Unprioritized   Hyperglycemia   Relevant Orders   Hemoglobin A1c   Hypertension   Relevant Medications    amLODipine (NORVASC) 5 MG tablet   losartan (COZAAR) 50 MG tablet   rosuvastatin (CRESTOR) 10 MG tablet   Hyperlipidemia - Primary   Relevant Medications   amLODipine (NORVASC) 5 MG tablet   losartan (COZAAR) 50 MG tablet   rosuvastatin (CRESTOR) 10 MG tablet   Other Relevant Orders   Lipid panel   CMP   GERD (gastroesophageal reflux disease)   Relevant Medications   omeprazole (PRILOSEC) 40  MG capsule   Other Visit Diagnoses     Physical exam       Relevant Orders   CBC with Differential/Platelet     Mr. Hosking 72 year old male with chronic problems as above is here today for physical exam.  We discussed the following health maintenance items  -Encouraged him to stop smoking.  Currently smoking almost a pack of cigarettes per day -Recommend flu vaccine but he declines -Discussed Shingrix but he declines at this time.  He has had prior Zostavax -Obtain labs as above -Follow-up immediately for any recurrent testicle pain especially if he has any visible swelling or worsening pain  No follow-ups on file.    Evelena Peat, MD

## 2023-02-21 ENCOUNTER — Other Ambulatory Visit: Payer: Self-pay | Admitting: Pulmonary Disease

## 2023-03-08 ENCOUNTER — Ambulatory Visit (HOSPITAL_COMMUNITY)
Admission: RE | Admit: 2023-03-08 | Discharge: 2023-03-08 | Disposition: A | Discharge status: Home / Self Care | Payer: Medicare Other | Source: Ambulatory Visit | Attending: Acute Care | Admitting: Acute Care

## 2023-03-08 DIAGNOSIS — I2510 Atherosclerotic heart disease of native coronary artery without angina pectoris: Secondary | ICD-10-CM | POA: Diagnosis not present

## 2023-03-08 DIAGNOSIS — Z122 Encounter for screening for malignant neoplasm of respiratory organs: Secondary | ICD-10-CM | POA: Diagnosis not present

## 2023-03-08 DIAGNOSIS — F17210 Nicotine dependence, cigarettes, uncomplicated: Secondary | ICD-10-CM | POA: Diagnosis not present

## 2023-03-08 DIAGNOSIS — Z87891 Personal history of nicotine dependence: Secondary | ICD-10-CM | POA: Insufficient documentation

## 2023-03-08 DIAGNOSIS — I700 Atherosclerosis of aorta: Secondary | ICD-10-CM | POA: Insufficient documentation

## 2023-03-08 DIAGNOSIS — J439 Emphysema, unspecified: Secondary | ICD-10-CM | POA: Insufficient documentation

## 2023-03-09 ENCOUNTER — Ambulatory Visit (HOSPITAL_BASED_OUTPATIENT_CLINIC_OR_DEPARTMENT_OTHER): Payer: Medicare Other

## 2023-03-13 ENCOUNTER — Encounter: Payer: Self-pay | Admitting: Family Medicine

## 2023-03-13 ENCOUNTER — Ambulatory Visit (INDEPENDENT_AMBULATORY_CARE_PROVIDER_SITE_OTHER): Payer: Medicare Other | Admitting: Family Medicine

## 2023-03-13 VITALS — BP 150/70 | HR 65 | Temp 98.0°F | Ht 63.0 in | Wt 176.1 lb

## 2023-03-13 DIAGNOSIS — K4090 Unilateral inguinal hernia, without obstruction or gangrene, not specified as recurrent: Secondary | ICD-10-CM | POA: Diagnosis not present

## 2023-03-13 NOTE — Progress Notes (Signed)
Established Patient Office Visit  Subjective   Patient ID: Martin Navarro, male    DOB: 1950-08-17  Age: 72 y.o. MRN: [id number]  Chief Complaint  Patient presents with   Abdominal Pain    Patient complains of lower abdominal pain, x4 weeks, Tried Ibuprofen and Tylenol    HPI   Martin Navarro is seen accompanied by daughter regarding right inguinal bulge.  He had mentioned this back in October but since then has become larger in size and also more symptomatic.  Frequent burning sensation.  Occasionally has fairly intense pain with this bulges out.  Has limited his activities recently.  No fevers or chills.  No nausea or vomiting.  No prior history of hernia repair  They had several questions also regarding his immunizations.  Is had flu vaccine.  Pneumonia vaccines complete.  Had Zostavax but no history of Shingrix.  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 He started smoking about 61 years ago. He has a 106 pack-year smoking history. He has quit using smokeless tobacco. He reports that he does not currently use drugs. He reports that he does not drink alcohol. family history includes Heart disease (age of  onset: 60) in his father; Hypertension in his father. Allergies  Allergen Reactions   Sulfa Antibiotics     Unknown    Review of Systems  Constitutional:  Negative for chills and fever.  Cardiovascular:  Negative for chest pain.  Gastrointestinal:  Positive for abdominal pain. Negative for nausea and vomiting.      Objective:     BP (!) 150/70   Pulse 65   Temp 98 F (36.7 C) (Oral)   Ht 5\' 3"  (1.6 m)   Wt 176 lb 1.6 oz (79.9 kg)   SpO2 96%   BMI 31.19 kg/m  BP Readings from Last 3 Encounters:  03/13/23 (!) 150/70  01/19/23 (!) 140/74  01/03/23 120/68   Wt Readings from Last 3 Encounters:  03/13/23 176 lb 1.6 oz (79.9 kg)  01/19/23 172 lb 8 oz (78.2 kg)  01/03/23 173 lb 3.2 oz (78.6 kg)      Physical Exam Vitals reviewed.  Constitutional:      General: He is not in acute distress.    Appearance: He is not ill-appearing.  Cardiovascular:     Rate and Rhythm: Normal rate and regular rhythm.  Abdominal:     Comments: Soft.  He has obvious right inguinal bulge with straining and coughing.  Reduces easily.  Slightly tender to palpation.  No overlying erythema  or warmth  Neurological:     Mental Status: He is alert.      No results found for any visits on 03/13/23.  Last CBC Lab Results  Component Value Date   WBC 8.9 01/19/2023   HGB 14.3 01/19/2023   HCT 43.7 01/19/2023   MCV 95.5 01/19/2023   MCH 30.7 04/28/2020   RDW 14.4 01/19/2023   PLT 256.0 01/19/2023   Last metabolic panel Lab Results  Component Value Date   GLUCOSE 108 (H) 01/19/2023   NA 141 01/19/2023   K 4.7 01/19/2023   CL 106 01/19/2023   CO2 29 01/19/2023   BUN 11 01/19/2023   CREATININE 0.86 01/19/2023   GFR 85.84 01/19/2023   CALCIUM 9.3 01/19/2023   PHOS 3.3 04/25/2020   PROT 6.7 01/19/2023   ALBUMIN 4.0 01/19/2023   BILITOT 0.8 01/19/2023   ALKPHOS 68 01/19/2023   AST 14 01/19/2023   ALT 13 01/19/2023   ANIONGAP 10 04/28/2020   Last lipids Lab Results  Component  Value Date   CHOL 121 01/19/2023   HDL 39.70 01/19/2023   LDLCALC 63 01/19/2023   LDLDIRECT 76.0 01/03/2021   TRIG 91.0 01/19/2023   CHOLHDL 3 01/19/2023   Last thyroid functions Lab Results  Component Value Date   TSH 2.40 01/03/2021      The ASCVD Risk score (Arnett DK, et al., 2019) failed to calculate for the following reasons:   The valid total cholesterol range is 130 to 320 mg/dL    Assessment & Plan:   Problem List Items Addressed This Visit   None Visit Diagnoses       Non-recurrent unilateral inguinal hernia without obstruction or gangrene    -  Primary   Relevant Orders   Ambulatory referral to General Surgery     Symptomatic right inguinal hernia. -Recommend referral to general surgery for repair as soon as possible -Reviewed signs and symptoms of strangulation with handout given  No follow-ups on file.    Evelena Peat, MD

## 2023-03-22 ENCOUNTER — Other Ambulatory Visit: Payer: Self-pay | Admitting: Acute Care

## 2023-03-22 DIAGNOSIS — Z87891 Personal history of nicotine dependence: Secondary | ICD-10-CM

## 2023-03-22 DIAGNOSIS — F17210 Nicotine dependence, cigarettes, uncomplicated: Secondary | ICD-10-CM

## 2023-03-22 DIAGNOSIS — Z122 Encounter for screening for malignant neoplasm of respiratory organs: Secondary | ICD-10-CM

## 2023-03-30 ENCOUNTER — Other Ambulatory Visit: Payer: Self-pay | Admitting: Surgery

## 2023-03-30 DIAGNOSIS — K4090 Unilateral inguinal hernia, without obstruction or gangrene, not specified as recurrent: Secondary | ICD-10-CM | POA: Diagnosis not present

## 2023-04-05 ENCOUNTER — Other Ambulatory Visit: Payer: Self-pay

## 2023-04-05 ENCOUNTER — Encounter (HOSPITAL_BASED_OUTPATIENT_CLINIC_OR_DEPARTMENT_OTHER): Payer: Self-pay | Admitting: Surgery

## 2023-04-06 ENCOUNTER — Encounter (HOSPITAL_BASED_OUTPATIENT_CLINIC_OR_DEPARTMENT_OTHER)
Admission: RE | Admit: 2023-04-06 | Discharge: 2023-04-06 | Disposition: A | Discharge status: Home / Self Care | Payer: Medicare Other | Source: Ambulatory Visit | Attending: Surgery | Admitting: Surgery

## 2023-04-06 DIAGNOSIS — Z01810 Encounter for preprocedural cardiovascular examination: Secondary | ICD-10-CM | POA: Diagnosis not present

## 2023-04-06 MED ORDER — CHLORHEXIDINE GLUCONATE CLOTH 2 % EX PADS: 6.0000 | MEDICATED_PAD | Freq: Once | CUTANEOUS | Status: DC

## 2023-04-06 MED ORDER — ENSURE PRE-SURGERY PO LIQD: 296.0000 mL | Freq: Once | ORAL | Status: DC

## 2023-04-06 NOTE — Progress Notes (Signed)

## 2023-04-10 NOTE — H&P (Signed)
 REFERRING PHYSICIAN: Micheal Wolm Blush* PROVIDER: VICENTA DASIE POLI, MD MRN: [id number] DOB: 27-Mar-1951 DATE OF ENCOUNTER: 03/30/2023 Subjective   Chief Complaint: New Consultation (hernia)  History of Present Illness: Martin Navarro is a 73 y.o. male who is seen today as an office consultation for evaluation of New Consultation (hernia)  This is a 73 year old gentleman who is referred here for evaluation of a symptomatic right inguinal hernia. His daughter is with him today. They report he has had the hernia for many years but over the last 3 months that is getting larger and now causing occasional sharp pain in the inguinal area especially when he coughs. He reports he has a bulge that is becoming more difficult to reduce. The pain is moderate in intensity. He has had no nausea, vomiting, or obstructive symptoms. He has a chronic cough secondary to his COPD but is not on any oxygen. He is very active  Review of Systems: A complete review of systems was obtained from the patient. I have reviewed this information and discussed as appropriate with the patient. See HPI as well for other ROS.  ROS   Medical History: Past Medical History:  Diagnosis Date  Arthritis  Asthma, unspecified asthma severity, unspecified whether complicated, unspecified whether persistent (HHS-HCC)  COPD (chronic obstructive pulmonary disease) (CMS/HHS-HCC)  GERD (gastroesophageal reflux disease)  History of cancer  Hyperlipidemia  Hypertension   There is no problem list on file for this patient.  Past Surgical History:  Procedure Laterality Date  TONSILLECTOMY    No Known Allergies  Current Outpatient Medications on File Prior to Visit  Medication Sig Dispense Refill  amLODIPine  (NORVASC ) 5 MG tablet Take 5 mg by mouth once daily  cyanocobalamin  (VITAMIN B12) 1000 MCG tablet Take 1,000-5,000 mcg by mouth  losartan  (COZAAR ) 50 MG tablet Take 1 tablet by mouth once daily  omega 3-dha-epa-fish  oil (FISH OIL) 1,200 (144-216) mg Cap Take 1,200 mg by mouth once daily  omeprazole  (PRILOSEC) 40 MG DR capsule Take 40 mg by mouth once daily  rosuvastatin  (CRESTOR ) 10 MG tablet Take 1 tablet by mouth once daily  umeclidinium-vilanteroL (ANORO ELLIPTA ) 62.5-25 mcg/actuation inhaler Inhale 1 Puff into the lungs once daily  vitamin E  400 UNIT capsule Take 1,200 Units by mouth   No current facility-administered medications on file prior to visit.   Family History  Problem Relation Age of Onset  Skin cancer Mother  High blood pressure (Hypertension) Mother  Hyperlipidemia (Elevated cholesterol) Mother  High blood pressure (Hypertension) Father  Hyperlipidemia (Elevated cholesterol) Father  Coronary Artery Disease (Blocked arteries around heart) Father  Skin cancer Sister  High blood pressure (Hypertension) Sister  Hyperlipidemia (Elevated cholesterol) Sister  Coronary Artery Disease (Blocked arteries around heart) Sister    Social History   Tobacco Use  Smoking Status Every Day  Types: Cigarettes  Smokeless Tobacco Never    Social History   Socioeconomic History  Marital status: Divorced  Tobacco Use  Smoking status: Every Day  Types: Cigarettes  Smokeless tobacco: Never  Vaping Use  Vaping status: Never Used  Substance and Sexual Activity  Alcohol use: Not Currently  Drug use: Never   Social Drivers of Health   Food Insecurity: No Food Insecurity (12/15/2021)  Received from [facility]  Hunger Vital Sign  Worried About Running Out of Food in the Last Year: Never true  Ran Out of Food in the Last Year: Never true  Transportation Needs: No Transportation Needs (12/15/2021)  Received from [facility] -  Risk Analyst (Medical): No  Lack of Transportation (Non-Medical): No  Housing Stability: Unknown (03/30/2023)  Housing Stability Vital Sign  Homeless in the Last Year: No   Objective:   Vitals:  03/30/23 1140  BP: 136/82   Pulse: 78  Temp: 36.7 C (98 F)  SpO2: 97%  Weight: 81.5 kg (179 lb 9.6 oz)  Height: 167.6 cm (5' 6)  PainSc: 4  PainLoc: Abdomen   Body mass index is 28.99 kg/m.  Physical Exam   He appears well on exam  His breathing is nonlabored  His abdomen is soft. There is a moderate sized, tender, but reducible right inguinal hernia without evidence of left inguinal hernia  Labs, Imaging and Diagnostic Testing: I have reviewed his notes in the electronic medical records  Assessment and Plan:   Diagnoses and all orders for this visit:  Right inguinal hernia   I explained abdominal wall anatomy and hernias to the patient and his daughter. As his right inguinal hernia is getting larger and increasingly symptomatic, repair is strongly recommended. Given his COPD, I would recommend an open right inguinal hernia repair with mesh with a tap block by anesthesiology and hopefully LMA as an outpatient. I explained the surgical procedure in detail. I discussed the risks which includes but is not limited to bleeding, infection, injury to surrounding structures, use of mesh, nerve entrapment, chronic pain, hernia recurrence, postoperative recovery, cardiopulmonary issues with anesthesia, etc. He understands and wants to proceed with surgery as soon as possible

## 2023-04-11 ENCOUNTER — Ambulatory Visit (HOSPITAL_BASED_OUTPATIENT_CLINIC_OR_DEPARTMENT_OTHER)
Admission: RE | Admit: 2023-04-11 | Discharge: 2023-04-11 | Disposition: A | Discharge status: Home / Self Care | Payer: Medicare Other | Attending: Surgery | Admitting: Surgery

## 2023-04-11 ENCOUNTER — Other Ambulatory Visit: Payer: Self-pay

## 2023-04-11 ENCOUNTER — Encounter (HOSPITAL_BASED_OUTPATIENT_CLINIC_OR_DEPARTMENT_OTHER)
Admission: RE | Disposition: A | Discharge status: Home / Self Care | Payer: Self-pay | Source: Home / Self Care | Attending: Surgery

## 2023-04-11 ENCOUNTER — Encounter (HOSPITAL_BASED_OUTPATIENT_CLINIC_OR_DEPARTMENT_OTHER): Payer: Self-pay | Admitting: Surgery

## 2023-04-11 ENCOUNTER — Ambulatory Visit (HOSPITAL_BASED_OUTPATIENT_CLINIC_OR_DEPARTMENT_OTHER): Payer: Medicare Other | Admitting: Anesthesiology

## 2023-04-11 DIAGNOSIS — G8918 Other acute postprocedural pain: Secondary | ICD-10-CM | POA: Diagnosis not present

## 2023-04-11 DIAGNOSIS — Z79899 Other long term (current) drug therapy: Secondary | ICD-10-CM | POA: Insufficient documentation

## 2023-04-11 DIAGNOSIS — J449 Chronic obstructive pulmonary disease, unspecified: Secondary | ICD-10-CM | POA: Insufficient documentation

## 2023-04-11 DIAGNOSIS — F17200 Nicotine dependence, unspecified, uncomplicated: Secondary | ICD-10-CM

## 2023-04-11 DIAGNOSIS — K4090 Unilateral inguinal hernia, without obstruction or gangrene, not specified as recurrent: Secondary | ICD-10-CM | POA: Diagnosis not present

## 2023-04-11 DIAGNOSIS — F17210 Nicotine dependence, cigarettes, uncomplicated: Secondary | ICD-10-CM | POA: Insufficient documentation

## 2023-04-11 DIAGNOSIS — Z01818 Encounter for other preprocedural examination: Secondary | ICD-10-CM

## 2023-04-11 DIAGNOSIS — K219 Gastro-esophageal reflux disease without esophagitis: Secondary | ICD-10-CM | POA: Insufficient documentation

## 2023-04-11 DIAGNOSIS — I10 Essential (primary) hypertension: Secondary | ICD-10-CM | POA: Insufficient documentation

## 2023-04-11 DIAGNOSIS — Z6831 Body mass index (BMI) 31.0-31.9, adult: Secondary | ICD-10-CM | POA: Diagnosis not present

## 2023-04-11 DIAGNOSIS — E669 Obesity, unspecified: Secondary | ICD-10-CM | POA: Diagnosis not present

## 2023-04-11 HISTORY — PX: INGUINAL HERNIA REPAIR: SHX194

## 2023-04-11 SURGERY — REPAIR, HERNIA, INGUINAL, ADULT
Anesthesia: Regional | Site: Groin | Laterality: Right

## 2023-04-11 MED ORDER — OXYCODONE HCL 5 MG PO TABS
ORAL_TABLET | ORAL | Status: AC
  Filled 2023-04-11: qty 1

## 2023-04-11 MED ORDER — ACETAMINOPHEN 500 MG PO TABS
ORAL_TABLET | ORAL | Status: AC
Start: 2023-04-11 — End: ?
  Filled 2023-04-11: qty 2

## 2023-04-11 MED ORDER — AMISULPRIDE (ANTIEMETIC) 5 MG/2ML IV SOLN: 10.0000 mg | Freq: Once | INTRAVENOUS | Status: DC | PRN

## 2023-04-11 MED ORDER — FENTANYL CITRATE (PF) 100 MCG/2ML IJ SOLN
INTRAMUSCULAR | Status: AC
  Filled 2023-04-11: qty 2

## 2023-04-11 MED ORDER — PHENYLEPHRINE 80 MCG/ML (10ML) SYRINGE FOR IV PUSH (FOR BLOOD PRESSURE SUPPORT)
PREFILLED_SYRINGE | INTRAVENOUS | Status: AC
  Filled 2023-04-11: qty 10

## 2023-04-11 MED ORDER — FENTANYL CITRATE (PF) 100 MCG/2ML IJ SOLN
25.0000 ug | INTRAMUSCULAR | Status: DC | PRN
  Administered 2023-04-11 (×2): 50 ug via INTRAVENOUS

## 2023-04-11 MED ORDER — CEFAZOLIN SODIUM-DEXTROSE 2-4 GM/100ML-% IV SOLN
INTRAVENOUS | Status: AC
  Filled 2023-04-11: qty 100

## 2023-04-11 MED ORDER — MIDAZOLAM HCL 2 MG/2ML IJ SOLN
INTRAMUSCULAR | Status: AC
  Filled 2023-04-11: qty 2

## 2023-04-11 MED ORDER — OXYCODONE HCL 5 MG PO TABS
5.0000 mg | ORAL_TABLET | Freq: Once | ORAL | Status: AC
  Administered 2023-04-11: 5 mg via ORAL

## 2023-04-11 MED ORDER — LACTATED RINGERS IV SOLN: INTRAVENOUS | Status: DC

## 2023-04-11 MED ORDER — CEFAZOLIN SODIUM-DEXTROSE 2-4 GM/100ML-% IV SOLN
2.0000 g | INTRAVENOUS | Status: AC
  Administered 2023-04-11: 2 g via INTRAVENOUS

## 2023-04-11 MED ORDER — BUPIVACAINE-EPINEPHRINE (PF) 0.5% -1:200000 IJ SOLN
INTRAMUSCULAR | Status: DC | PRN
Start: 2023-04-11 — End: 2023-04-11
  Administered 2023-04-11: 30 mL via PERINEURAL

## 2023-04-11 MED ORDER — MIDAZOLAM HCL 2 MG/2ML IJ SOLN
1.0000 mg | Freq: Once | INTRAMUSCULAR | Status: AC
  Administered 2023-04-11: 1 mg via INTRAVENOUS

## 2023-04-11 MED ORDER — SODIUM CHLORIDE 0.9 % IV SOLN: INTRAVENOUS | Status: DC | PRN

## 2023-04-11 MED ORDER — PROPOFOL 10 MG/ML IV BOLUS
INTRAVENOUS | Status: AC
  Filled 2023-04-11: qty 20

## 2023-04-11 MED ORDER — PHENYLEPHRINE 80 MCG/ML (10ML) SYRINGE FOR IV PUSH (FOR BLOOD PRESSURE SUPPORT)
PREFILLED_SYRINGE | INTRAVENOUS | Status: DC | PRN
  Administered 2023-04-11 (×2): 160 ug via INTRAVENOUS
  Administered 2023-04-11: 80 ug via INTRAVENOUS

## 2023-04-11 MED ORDER — ACETAMINOPHEN 500 MG PO TABS
1000.0000 mg | ORAL_TABLET | ORAL | Status: AC
  Administered 2023-04-11: 1000 mg via ORAL

## 2023-04-11 MED ORDER — BUPIVACAINE-EPINEPHRINE 0.5% -1:200000 IJ SOLN
INTRAMUSCULAR | Status: DC | PRN
  Administered 2023-04-11: 10 mL

## 2023-04-11 MED ORDER — LIDOCAINE 2% (20 MG/ML) 5 ML SYRINGE
INTRAMUSCULAR | Status: DC | PRN
  Administered 2023-04-11: 50 mg via INTRAVENOUS

## 2023-04-11 MED ORDER — TRAMADOL HCL 50 MG PO TABS: 50.0000 mg | ORAL_TABLET | Freq: Four times a day (QID) | ORAL | 0 refills | Status: DC | PRN

## 2023-04-11 MED ORDER — DEXAMETHASONE SODIUM PHOSPHATE 4 MG/ML IJ SOLN
INTRAMUSCULAR | Status: DC | PRN
  Administered 2023-04-11: 10 mg via INTRAVENOUS

## 2023-04-11 MED ORDER — LIDOCAINE 2% (20 MG/ML) 5 ML SYRINGE
INTRAMUSCULAR | Status: AC
  Filled 2023-04-11: qty 5

## 2023-04-11 MED ORDER — DEXAMETHASONE SODIUM PHOSPHATE 10 MG/ML IJ SOLN
INTRAMUSCULAR | Status: AC
  Filled 2023-04-11: qty 1

## 2023-04-11 MED ORDER — FENTANYL CITRATE (PF) 100 MCG/2ML IJ SOLN
INTRAMUSCULAR | Status: DC | PRN
  Administered 2023-04-11 (×2): 25 ug via INTRAVENOUS

## 2023-04-11 MED ORDER — ONDANSETRON HCL 4 MG/2ML IJ SOLN
INTRAMUSCULAR | Status: DC | PRN
  Administered 2023-04-11: 4 mg via INTRAVENOUS

## 2023-04-11 MED ORDER — ONDANSETRON HCL 4 MG/2ML IJ SOLN: 4.0000 mg | Freq: Once | INTRAMUSCULAR | Status: DC | PRN

## 2023-04-11 MED ORDER — ONDANSETRON HCL 4 MG/2ML IJ SOLN
INTRAMUSCULAR | Status: AC
  Filled 2023-04-11: qty 2

## 2023-04-11 MED ORDER — PROPOFOL 10 MG/ML IV BOLUS
INTRAVENOUS | Status: DC | PRN
  Administered 2023-04-11: 200 mg via INTRAVENOUS

## 2023-04-11 SURGICAL SUPPLY — 39 items
BLADE CLIPPER SURG (BLADE) ×2 IMPLANT
BLADE SURG 15 STRL LF DISP TIS (BLADE) ×2 IMPLANT
CANISTER SUCT 1200ML W/VALVE (MISCELLANEOUS) IMPLANT
CHLORAPREP W/TINT 26 (MISCELLANEOUS) ×2 IMPLANT
COVER BACK TABLE 60X90IN (DRAPES) ×2 IMPLANT
COVER MAYO STAND STRL (DRAPES) ×2 IMPLANT
DERMABOND ADVANCED .7 DNX12 (GAUZE/BANDAGES/DRESSINGS) ×2 IMPLANT
DRAIN PENROSE .5X12 LATEX STL (DRAIN) ×2 IMPLANT
DRAPE LAPAROTOMY 100X72 PEDS (DRAPES) ×2 IMPLANT
DRAPE UTILITY XL STRL (DRAPES) ×2 IMPLANT
ELECT REM PT RETURN 9FT ADLT (ELECTROSURGICAL) ×1 IMPLANT
ELECTRODE REM PT RTRN 9FT ADLT (ELECTROSURGICAL) ×2 IMPLANT
GLOVE BIO SURGEON STRL SZ 6.5 (GLOVE) IMPLANT
GLOVE BIOGEL PI IND STRL 6.5 (GLOVE) IMPLANT
GLOVE BIOGEL PI IND STRL 7.5 (GLOVE) IMPLANT
GLOVE SURG SIGNA 7.5 PF LTX (GLOVE) ×2 IMPLANT
GLOVE SURG SYN 7.5 E (GLOVE) ×1 IMPLANT
GLOVE SURG SYN 7.5 PF PI (GLOVE) IMPLANT
GOWN STRL REUS W/ TWL LRG LVL3 (GOWN DISPOSABLE) ×2 IMPLANT
GOWN STRL REUS W/ TWL XL LVL3 (GOWN DISPOSABLE) ×2 IMPLANT
MESH PARIETEX PROGRIP RIGHT (Mesh General) IMPLANT
NDL HYPO 25X1 1.5 SAFETY (NEEDLE) ×2 IMPLANT
NEEDLE HYPO 25X1 1.5 SAFETY (NEEDLE) ×1 IMPLANT
NS IRRIG 1000ML POUR BTL (IV SOLUTION) IMPLANT
PACK BASIN DAY SURGERY FS (CUSTOM PROCEDURE TRAY) ×2 IMPLANT
PENCIL SMOKE EVACUATOR (MISCELLANEOUS) ×2 IMPLANT
SLEEVE SCD COMPRESS KNEE MED (STOCKING) ×2 IMPLANT
SPIKE FLUID TRANSFER (MISCELLANEOUS) IMPLANT
SPONGE INTESTINAL PEANUT (DISPOSABLE) IMPLANT
SPONGE T-LAP 4X18 ~~LOC~~+RFID (SPONGE) ×2 IMPLANT
SUT MNCRL AB 4-0 PS2 18 (SUTURE) ×2 IMPLANT
SUT SILK 2 0 SH (SUTURE) IMPLANT
SUT VIC AB 2-0 CT1 TAPERPNT 27 (SUTURE) ×4 IMPLANT
SUT VIC AB 3-0 CT1 27XBRD (SUTURE) ×2 IMPLANT
SYR BULB EAR ULCER 3OZ GRN STR (SYRINGE) IMPLANT
SYR CONTROL 10ML LL (SYRINGE) ×2 IMPLANT
TOWEL GREEN STERILE FF (TOWEL DISPOSABLE) ×2 IMPLANT
TUBE CONNECTING 20X1/4 (TUBING) IMPLANT
YANKAUER SUCT BULB TIP NO VENT (SUCTIONS) IMPLANT

## 2023-04-11 NOTE — Anesthesia Preprocedure Evaluation (Signed)
 Anesthesia Evaluation  Patient identified by MRN, date of birth, ID band Patient awake    Reviewed: Allergy & Precautions, NPO status , Patient's Chart, lab work & pertinent test results  Airway Mallampati: III  TM Distance: >3 FB Neck ROM: Full    Dental  (+) Edentulous Lower, Edentulous Upper   Pulmonary asthma , COPD,  COPD inhaler, Current Smoker and Patient abstained from smoking.   Pulmonary exam normal        Cardiovascular hypertension, Pt. on medications Normal cardiovascular exam     Neuro/Psych negative neurological ROS  negative psych ROS   GI/Hepatic Neg liver ROS,GERD  Medicated and Controlled,,  Endo/Other  negative endocrine ROS    Renal/GU Renal disease     Musculoskeletal negative musculoskeletal ROS (+)    Abdominal  (+) + obese  Peds  Hematology negative hematology ROS (+)   Anesthesia Other Findings RIGHT INGUINAL HERNIA  Reproductive/Obstetrics                             Anesthesia Physical Anesthesia Plan  ASA: 3  Anesthesia Plan: General and Regional   Post-op Pain Management: Regional block*   Induction: Intravenous  PONV Risk Score and Plan: 1 and Ondansetron , Dexamethasone , Midazolam  and Treatment may vary due to age or medical condition  Airway Management Planned: LMA  Additional Equipment:   Intra-op Plan:   Post-operative Plan: Extubation in OR  Informed Consent: I have reviewed the patients History and Physical, chart, labs and discussed the procedure including the risks, benefits and alternatives for the proposed anesthesia with the patient or authorized representative who has indicated his/her understanding and acceptance.     Dental advisory given  Plan Discussed with: CRNA  Anesthesia Plan Comments:        Anesthesia Quick Evaluation

## 2023-04-11 NOTE — Anesthesia Procedure Notes (Signed)
 Procedure Name: LMA Insertion Date/Time: 04/11/2023 9:53 AM  Performed by: Darcel Early, CRNAPre-anesthesia Checklist: Patient identified, Emergency Drugs available, Suction available, Patient being monitored and Timeout performed Patient Re-evaluated:Patient Re-evaluated prior to induction Oxygen Delivery Method: Circle system utilized Preoxygenation: Pre-oxygenation with 100% oxygen Induction Type: IV induction Ventilation: Mask ventilation without difficulty LMA: LMA inserted LMA Size: 4.0 Placement Confirmation: positive ETCO2 Tube secured with: Tape Dental Injury: Teeth and Oropharynx as per pre-operative assessment

## 2023-04-11 NOTE — Op Note (Signed)
 OPEN RIGHT INGUINAL HERNIA REPAIR WITH MESH  Procedure Note  Martin Navarro 04/11/2023   Pre-op Diagnosis: RIGHT INGUINAL HERNIA     Post-op Diagnosis: dame  Procedure(s): OPEN RIGHT INGUINAL HERNIA REPAIR WITH MESH  Surgeon(s): Martin Blumenthal, MD Maczis, Loura Rower, PA-C  Anesthesia: General  Staff:  Circulator: Altamease Asters, RN Scrub Person: Chrystie Crass A  Estimated Blood Loss: Minimal               Findings: The patient was found to have an indirect and direct right inguinal hernia.  These were repaired with a large piece of Prolene ProGrip mesh from [employer]  Procedure: The patient was brought to the operating room and identified the correct patient.  He was placed upon on the operating room table and general anesthesia was induced.  His abdomen was then prepped and draped in the usual sterile fashion.  I anesthetized the right inguinal area with Marcaine  and then made a longitudinal incision with a scalpel.  We dissected down through Scarpa's fascia with electrocautery.  We then opened the external oblique fascia toward the internal and external rings.  The testicular cord and structures with a controlled with a Penrose drain.  The patient had a large indirect inguinal hernia sac.  All contents had already been reduced.  I was able to separate the sac from the cord structures.  I then elected to reduce the hernia sac imbricating it back into the abdominal cavity.  I then closed the internal ring slightly with interrupted 2-0 silk sutures.  The patient was found to also have a direct hernia defect and I imbricated the inguinal floor with a silk suture as well to maintain its reduction.  Next a large piece of Prolene ProGrip mesh was brought to the field.  I placed it against the pubic tubercle and then brought around the cord structures.  I then sutured the mesh to the pubic tubercle and the transversalis fascia with interrupted 2-0 silk sutures.  Wide coverage of the  inguinal floor as well as the internal ring appeared to be achieved.  I then closed the external oblique fascia over the top of the mesh with a running 2-0 Vicryl suture.  Scarpa's fascia was then closed with interrupted 3-0 Vicryl sutures and the skin was closed with a running 4-0 Monocryl.  Dermabond was then applied.  The patient tolerated the procedure well.  All the counts were correct at the end of the procedure.  The patient was then extubated in the operating room and taken in a stable condition to the recovery room.          Martin Navarro   Date: 04/11/2023  Time: 10:28 AM

## 2023-04-11 NOTE — Discharge Instructions (Addendum)
 [facility] _______[facility]  UMBILICAL OR INGUINAL HERNIA REPAIR: POST OP INSTRUCTIONS  Always review your discharge instruction sheet given to you by the facility where your surgery was performed. IF YOU HAVE DISABILITY OR FAMILY LEAVE FORMS, YOU MUST BRING THEM TO THE OFFICE FOR PROCESSING.   DO NOT GIVE THEM TO YOUR DOCTOR.  1. A  prescription for pain medication may be given to you upon discharge.  Take your pain medication as prescribed, if needed.  If narcotic pain medicine is not needed, then you may take acetaminophen  (Tylenol ) or ibuprofen (Advil) as needed. 2. Take your usually prescribed medications unless otherwise directed. If you need a refill on your pain medication, please contact your pharmacy.  They will contact our office to request authorization. Prescriptions will not be filled after 5 pm or on week-ends. 3. You should follow a light diet the first 24 hours after arrival home, such as soup and crackers, etc.  Be sure to include lots of fluids daily.  Resume your normal diet the day after surgery. 4.Most patients will experience some swelling and bruising around the umbilicus or in the groin and scrotum.  Ice packs and reclining will help.  Swelling and bruising can take several days to resolve.  6. It is common to experience some constipation if taking pain medication after surgery.  Increasing fluid intake and taking a stool softener (such as Colace) will usually help or prevent this problem from occurring.  A mild laxative (Milk of Magnesia or Miralax ) should be taken according to package directions if there are no bowel movements after 48 hours. 7. Unless discharge instructions indicate otherwise, you may remove your bandages 24-48 hours after surgery, and you may shower at that time.  You may have steri-strips (small skin tapes) in place directly over the incision.  These strips should be left on the skin for 7-10 days.  If your surgeon used skin glue on the  incision, you may shower in 24 hours.  The glue will flake off over the next 2-3 weeks.  Any sutures or staples will be removed at the office during your follow-up visit. 8. ACTIVITIES:  You may resume regular (light) daily activities beginning the next day--such as daily self-care, walking, climbing stairs--gradually increasing activities as tolerated.  You may have sexual intercourse when it is comfortable.  Refrain from any heavy lifting or straining until approved by your doctor.  a.You may drive when you are no longer taking prescription pain medication, you can comfortably wear a seatbelt, and you can safely maneuver your car and apply brakes. b.RETURN TO WORK:   _____________________________________________  9.You should see your doctor in the office for a follow-up appointment approximately 2-3 weeks after your surgery.  Make sure that you call for this appointment within a day or two after you arrive home to insure a convenient appointment time. 10.OTHER INSTRUCTIONS: YOU MAY SHOWER STARTING TOMORROW ICE PACK AND EXTRA STRENGTH TYLENOL  ALSO FOR PAIN NO LIFTING MORE THAN 15 POUNDS FOR 4 WEEKS    _____________________________________  WHEN TO CALL YOUR DOCTOR: Fever over 101.0 Inability to urinate Nausea and/or vomiting Extreme swelling or bruising Continued bleeding from incision. Increased pain, redness, or drainage from the incision  The clinic staff is available to answer your questions during regular business hours.  Please don't hesitate to call and ask to speak to one of the nurses for clinical concerns.  If you have a medical emergency, go to the nearest emergency room or call 911.  A surgeon from [facility] is always on call at the hospital   [address], [place], [place]  [address] ?  [address] [address], [place], [place]   [address] [phone] ? [phone] ? FAX [phone] Web site: [email or web address]  Instructions  Activity: Get plenty of rest for the remainder of the day. A responsible individual must stay with you for 24 hours following the procedure.  For the next 24 hours, DO NOT: -Drive a car -Advertising copywriter -Drink alcoholic beverages -Take any medication unless instructed by your physician -Make any legal decisions or sign important papers.  Meals: Start with liquid foods such as gelatin or soup. Progress to regular foods as tolerated. Avoid greasy, spicy, heavy foods. If nausea and/or vomiting occur, drink only clear liquids until the nausea and/or vomiting subsides. Call your physician if vomiting continues.  Special Instructions/Symptoms: Your throat may feel dry or sore from the anesthesia or the breathing tube placed in your throat during surgery. If this causes discomfort, gargle with warm salt water. The discomfort should disappear within 24 hours.  If you had a scopolamine patch placed behind your ear for the management of post- operative nausea and/or vomiting:  1. The medication in the patch is effective for 72 hours, after which it should be removed.  Wrap patch in a tissue and discard in the trash. Wash hands thoroughly with soap and water. 2. You may remove the patch earlier than 72 hours if you experience unpleasant side effects which may include dry mouth, dizziness or visual disturbances. 3. Avoid touching the patch. Wash your hands with soap and water after contact with the patch.      Next dose of tylenol  may be taken at 3p

## 2023-04-11 NOTE — Anesthesia Postprocedure Evaluation (Signed)
 Anesthesia Post Note  Patient: Martin Navarro  Procedure(s) Performed: OPEN RIGHT INGUINAL HERNIA REPAIR WITH MESH (Right: Groin)     Patient location during evaluation: PACU Anesthesia Type: Regional and General Level of consciousness: awake Pain management: pain level controlled Vital Signs Assessment: post-procedure vital signs reviewed and stable Respiratory status: spontaneous breathing, nonlabored ventilation and respiratory function stable Cardiovascular status: blood pressure returned to baseline and stable Postop Assessment: no apparent nausea or vomiting Anesthetic complications: no   No notable events documented.  Last Vitals:  Vitals:   04/11/23 1130 04/11/23 1158  BP:  118/72  Pulse: 64 71  Resp: 19 18  Temp:  36.6 C  SpO2: 92% 92%    Last Pain:  Vitals:   04/11/23 1158  TempSrc: Temporal  PainSc: 6                  Raven Harmes P Johnell Bas

## 2023-04-11 NOTE — Progress Notes (Signed)
Assisted Dr. Ellender with right, transabdominal plane, ultrasound guided block. Side rails up, monitors on throughout procedure. See vital signs in flow sheet. Tolerated Procedure well. 

## 2023-04-11 NOTE — Transfer of Care (Signed)
 Immediate Anesthesia Transfer of Care Note  Patient: Martin Navarro  Procedure(s) Performed: OPEN RIGHT INGUINAL HERNIA REPAIR WITH MESH (Right: Groin)  Patient Location: PACU  Anesthesia Type:General  Level of Consciousness: drowsy  Airway & Oxygen Therapy: Patient Spontanous Breathing and Patient connected to face mask oxygen  Post-op Assessment: Report given to RN and Post -op Vital signs reviewed and stable  Post vital signs: Reviewed and stable  Last Vitals:  Vitals Value Taken Time  BP 121/72 04/11/23 1041  Temp    Pulse 52 04/11/23 1044  Resp 12 04/11/23 1044  SpO2 99 % 04/11/23 1044  Vitals shown include unfiled device data.  Last Pain:  Vitals:   04/11/23 0818  TempSrc: Oral  PainSc: 4       Patients Stated Pain Goal: 4 (04/11/23 0818)  Complications: No notable events documented.

## 2023-04-11 NOTE — Interval H&P Note (Signed)
 History and Physical Interval Note:no change in H and P  04/11/2023 8:11 AM  Martin Navarro  has presented today for surgery, with the diagnosis of RIGHT INGUINAL HERNIA.  The various methods of treatment have been discussed with the patient and family. After consideration of risks, benefits and other options for treatment, the patient has consented to  Procedure(s): OPEN RIGHT INGUINAL HERNIA REPAIR WITH MESH (Right) as a surgical intervention.  The patient's history has been reviewed, patient examined, no change in status, stable for surgery.  I have reviewed the patient's chart and labs.  Questions were answered to the patient's satisfaction.     Oza Blumenthal

## 2023-04-11 NOTE — Anesthesia Procedure Notes (Signed)
 Anesthesia Regional Block: TAP block   Pre-Anesthetic Checklist: , timeout performed,  Correct Patient, Correct Site, Correct Laterality,  Correct Procedure, Correct Position, site marked,  Risks and benefits discussed,  Surgical consent,  Pre-op evaluation,  At surgeon's request and post-op pain management  Laterality: Right  Prep: chloraprep       Needles:  Injection technique: Single-shot  Needle Type: Echogenic Stimulator Needle     Needle Length: 10cm  Needle Gauge: 20     Additional Needles:   Procedures:,,,, ultrasound used (permanent image in chart),,    Narrative:  Start time: 04/11/2023 8:50 AM End time: 04/11/2023 9:00 AM Injection made incrementally with aspirations every 5 mL.  Performed by: Personally  Anesthesiologist: Peggi Bowels, MD  Additional Notes: Functioning IV was confirmed and monitors were applied.  A timeout was performed. Sterile prep, hand hygiene and sterile gloves were used. A 20ga Bbraun echogenic stimulator needle was used. Negative aspiration and negative test dose prior to incremental administration of local anesthetic. The patient tolerated the procedure well.  Ultrasound guidance: relevent anatomy identified, needle position confirmed, local anesthetic spread visualized around nerve(s), vascular puncture avoided.  Image printed for medical record.

## 2023-04-12 ENCOUNTER — Encounter (HOSPITAL_BASED_OUTPATIENT_CLINIC_OR_DEPARTMENT_OTHER): Payer: Self-pay | Admitting: Surgery

## 2023-06-03 DIAGNOSIS — J0110 Acute frontal sinusitis, unspecified: Secondary | ICD-10-CM | POA: Diagnosis not present

## 2023-06-03 DIAGNOSIS — J029 Acute pharyngitis, unspecified: Secondary | ICD-10-CM | POA: Diagnosis not present

## 2023-06-19 DIAGNOSIS — D485 Neoplasm of uncertain behavior of skin: Secondary | ICD-10-CM | POA: Diagnosis not present

## 2023-06-19 DIAGNOSIS — L821 Other seborrheic keratosis: Secondary | ICD-10-CM | POA: Diagnosis not present

## 2023-06-19 DIAGNOSIS — L814 Other melanin hyperpigmentation: Secondary | ICD-10-CM | POA: Diagnosis not present

## 2023-06-19 DIAGNOSIS — L570 Actinic keratosis: Secondary | ICD-10-CM | POA: Diagnosis not present

## 2023-06-27 ENCOUNTER — Other Ambulatory Visit: Payer: Self-pay | Admitting: Pulmonary Disease

## 2023-08-27 ENCOUNTER — Telehealth: Payer: Self-pay

## 2023-08-27 NOTE — Telephone Encounter (Signed)
 Copied from CRM [id number]. Topic: General - Other >> Aug 27, 2023  9:52 AM Dorisann Garre T wrote: Reason for CRM: patient daughter would like a call back patient is having issues with his throat epic would not allow me to schedule a regular visit /office visit epic had a whole bunch of other appts popping up for me to schedule patient diabetes care physical and patient doesn't need those kind of appts

## 2023-08-27 NOTE — Telephone Encounter (Signed)
 Appt scheduled

## 2023-08-28 ENCOUNTER — Ambulatory Visit (INDEPENDENT_AMBULATORY_CARE_PROVIDER_SITE_OTHER): Admitting: Family Medicine

## 2023-08-28 ENCOUNTER — Encounter: Payer: Self-pay | Admitting: Family Medicine

## 2023-08-28 VITALS — BP 142/80 | HR 67 | Temp 98.0°F | Wt 178.5 lb

## 2023-08-28 DIAGNOSIS — J029 Acute pharyngitis, unspecified: Secondary | ICD-10-CM

## 2023-08-28 NOTE — Patient Instructions (Signed)
 HOLD the Anoro for the next 2-3 weeks to see if symptoms improve  Let me know if the sore throat does not improve with stopping the inhaler  If the symptoms do NOT improved with stopping the Anoro for 3-4 weeks would consider ENT referral.

## 2023-08-28 NOTE — Progress Notes (Signed)
 Established Patient Office Visit  Subjective   Patient ID: Martin Navarro, male    DOB: Aug 19, 1950  Age: 73 y.o. MRN: [id number]  Chief Complaint  Patient presents with   Sore Throat    Patient complains of sore throat, x4-5 weeks     HPI   Martin Navarro is seen with approximately 4 to 6-week history of sore throat.  He states he has almost daily pain especially noted with things like cold air exposure.  Still swallowing liquids and solids without difficulty.  No dysphagia.  Denies any chronic sinusitis or postnasal drip symptoms.  No nasal congestion.  No fever or chills.  Good appetite no weight changes.  He has not noted any lymphadenopathy in the neck.  Does have a history of GERD but is on omeprazole  regularly no recent obvious GERD symptoms.  No hoarseness.  No cough.  Does use Anoro Eliptra inhaler and has been on this for at least couple years.  Has not noticed any recent thrush.  He does rinse his mouth after use.  Does not use any steroid inhalers.  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   Hypokalemia    Nausea    Nicotine abuse 10/25/2010   Prostatitis    Sepsis (HCC) 09/21/2016   Skin cancer    Past Surgical History:  Procedure Laterality Date   COLONOSCOPY  2010   2016   INGUINAL HERNIA REPAIR Right 04/11/2023   Procedure: OPEN RIGHT INGUINAL HERNIA REPAIR WITH MESH;  Surgeon: Oza Blumenthal, MD;  Location: [facility];  Service: General;  Laterality: Right;   SKIN TAG  REMOVAL  11/2019   TONSILLECTOMY      reports that he has been smoking e-cigarettes and cigarettes. He started smoking about 61 years ago. He has a 106 pack-year smoking history. He has quit using smokeless tobacco. He reports that he does not currently use drugs. He reports that he does not drink alcohol. family history includes Heart disease (age of onset: 12) in his father; Hypertension in his father. Allergies  Allergen Reactions   Sulfa Antibiotics     Unknown    Review of Systems  Constitutional:  Negative for chills, fever and weight loss.  HENT:  Positive for sore throat. Negative for congestion and sinus pain.   Respiratory:  Negative for cough.   Cardiovascular:  Negative for chest pain.  Gastrointestinal:  Negative for abdominal pain.      Objective:     BP (!) 142/80 (BP Location: Left Arm, Patient Position: Sitting, Cuff Size: Normal)   Pulse 67   Temp 98 F (36.7 C) (Oral)   Wt 178 lb 8 oz (81 kg)   SpO2 96%   BMI 31.62 kg/m  BP Readings from Last 3 Encounters:  08/28/23 (!) 142/80  04/11/23 118/72  03/13/23 (!) 150/70   Wt Readings from Last 3 Encounters:  08/28/23 178 lb 8 oz (81 kg)  04/11/23 177 lb 4 oz (80.4 kg)  03/13/23 176 lb 1.6 oz (79.9 kg)      Physical Exam Vitals reviewed.  Constitutional:      General: He is not in acute distress.    Appearance: He is not ill-appearing.  HENT:     Right Ear: Tympanic membrane normal.     Left Ear: Tympanic membrane normal.     Mouth/Throat:     Mouth: Mucous membranes are moist. No oral lesions.     Pharynx: Oropharynx is clear. No pharyngeal swelling, oropharyngeal exudate or posterior oropharyngeal erythema.  Cardiovascular:     Rate and Rhythm: Normal rate and regular rhythm.  Musculoskeletal:     Cervical back: Neck supple.  Lymphadenopathy:     Cervical: No cervical adenopathy.  Neurological:     Mental Status: He is alert.      No results found for any visits on 08/28/23.    The  ASCVD Risk score (Arnett DK, et al., 2019) failed to calculate for the following reasons:   The valid total cholesterol range is 130 to 320 mg/dL    Assessment & Plan:   Presents with almost 6-week history of frequent almost daily sore throat symptoms.  Symptoms somewhat intermittent.  Does not have any red flags such as appetite change, weight loss, fever, adenopathy, chronic sinusitis symptoms, postnasal drip, etc.  GERD symptoms controlled on omeprazole .  No recent hoarseness.  He is currently on combination inhaler with LAMA and LABA-with Anoro.  Pharyngitis is listed as a possible side effect/symptom although he has been on this for couple years.  - We did discuss possible trial off Anoro for the next 2 to 3 weeks to see if symptoms resolve.  If they do, consider follow-up with pulmonary to consider possible alternatives -He will let us  know if symptoms not improving off the Anoro in the next few weeks and we consider possible ENT referral  Glean Lamy, MD

## 2023-09-04 ENCOUNTER — Encounter: Payer: Self-pay | Admitting: Family Medicine

## 2023-09-04 DIAGNOSIS — J029 Acute pharyngitis, unspecified: Secondary | ICD-10-CM

## 2023-10-20 ENCOUNTER — Other Ambulatory Visit: Payer: Self-pay | Admitting: Family Medicine

## 2023-10-28 ENCOUNTER — Encounter (HOSPITAL_COMMUNITY): Payer: Self-pay | Admitting: Certified Registered Nurse Anesthetist

## 2023-10-28 ENCOUNTER — Inpatient Hospital Stay (HOSPITAL_BASED_OUTPATIENT_CLINIC_OR_DEPARTMENT_OTHER)
Admission: EM | Admit: 2023-10-28 | Discharge: 2023-10-29 | DRG: 322 | Disposition: A | Discharge status: Home / Self Care | Attending: Cardiovascular Disease | Admitting: Cardiovascular Disease

## 2023-10-28 ENCOUNTER — Inpatient Hospital Stay (HOSPITAL_COMMUNITY)

## 2023-10-28 ENCOUNTER — Other Ambulatory Visit: Payer: Self-pay

## 2023-10-28 ENCOUNTER — Encounter (HOSPITAL_COMMUNITY)
Admission: EM | Disposition: A | Discharge status: Home / Self Care | Payer: Self-pay | Source: Home / Self Care | Attending: Cardiovascular Disease

## 2023-10-28 ENCOUNTER — Emergency Department (HOSPITAL_BASED_OUTPATIENT_CLINIC_OR_DEPARTMENT_OTHER)

## 2023-10-28 ENCOUNTER — Encounter (HOSPITAL_BASED_OUTPATIENT_CLINIC_OR_DEPARTMENT_OTHER): Payer: Self-pay

## 2023-10-28 DIAGNOSIS — I10 Essential (primary) hypertension: Secondary | ICD-10-CM | POA: Diagnosis present

## 2023-10-28 DIAGNOSIS — E782 Mixed hyperlipidemia: Secondary | ICD-10-CM | POA: Diagnosis not present

## 2023-10-28 DIAGNOSIS — J4489 Other specified chronic obstructive pulmonary disease: Secondary | ICD-10-CM | POA: Diagnosis present

## 2023-10-28 DIAGNOSIS — Z7982 Long term (current) use of aspirin: Secondary | ICD-10-CM

## 2023-10-28 DIAGNOSIS — Z85828 Personal history of other malignant neoplasm of skin: Secondary | ICD-10-CM

## 2023-10-28 DIAGNOSIS — R2231 Localized swelling, mass and lump, right upper limb: Secondary | ICD-10-CM | POA: Diagnosis not present

## 2023-10-28 DIAGNOSIS — Z743 Need for continuous supervision: Secondary | ICD-10-CM | POA: Diagnosis not present

## 2023-10-28 DIAGNOSIS — R609 Edema, unspecified: Secondary | ICD-10-CM | POA: Diagnosis not present

## 2023-10-28 DIAGNOSIS — Z7951 Long term (current) use of inhaled steroids: Secondary | ICD-10-CM

## 2023-10-28 DIAGNOSIS — I2119 ST elevation (STEMI) myocardial infarction involving other coronary artery of inferior wall: Principal | ICD-10-CM | POA: Diagnosis present

## 2023-10-28 DIAGNOSIS — Z882 Allergy status to sulfonamides status: Secondary | ICD-10-CM

## 2023-10-28 DIAGNOSIS — T801XXA Vascular complications following infusion, transfusion and therapeutic injection, initial encounter: Secondary | ICD-10-CM | POA: Diagnosis not present

## 2023-10-28 DIAGNOSIS — F17210 Nicotine dependence, cigarettes, uncomplicated: Secondary | ICD-10-CM | POA: Diagnosis not present

## 2023-10-28 DIAGNOSIS — Z716 Tobacco abuse counseling: Secondary | ICD-10-CM

## 2023-10-28 DIAGNOSIS — I2510 Atherosclerotic heart disease of native coronary artery without angina pectoris: Secondary | ICD-10-CM

## 2023-10-28 DIAGNOSIS — I700 Atherosclerosis of aorta: Secondary | ICD-10-CM | POA: Diagnosis not present

## 2023-10-28 DIAGNOSIS — M7989 Other specified soft tissue disorders: Secondary | ICD-10-CM | POA: Diagnosis not present

## 2023-10-28 DIAGNOSIS — F17290 Nicotine dependence, other tobacco product, uncomplicated: Secondary | ICD-10-CM | POA: Diagnosis not present

## 2023-10-28 DIAGNOSIS — I959 Hypotension, unspecified: Secondary | ICD-10-CM | POA: Diagnosis not present

## 2023-10-28 DIAGNOSIS — Y838 Other surgical procedures as the cause of abnormal reaction of the patient, or of later complication, without mention of misadventure at the time of the procedure: Secondary | ICD-10-CM | POA: Diagnosis not present

## 2023-10-28 DIAGNOSIS — L7632 Postprocedural hematoma of skin and subcutaneous tissue following other procedure: Secondary | ICD-10-CM | POA: Diagnosis not present

## 2023-10-28 DIAGNOSIS — I213 ST elevation (STEMI) myocardial infarction of unspecified site: Secondary | ICD-10-CM | POA: Diagnosis not present

## 2023-10-28 DIAGNOSIS — Z955 Presence of coronary angioplasty implant and graft: Secondary | ICD-10-CM

## 2023-10-28 DIAGNOSIS — Z79899 Other long term (current) drug therapy: Secondary | ICD-10-CM | POA: Diagnosis not present

## 2023-10-28 DIAGNOSIS — Z8249 Family history of ischemic heart disease and other diseases of the circulatory system: Secondary | ICD-10-CM | POA: Diagnosis not present

## 2023-10-28 DIAGNOSIS — J439 Emphysema, unspecified: Secondary | ICD-10-CM | POA: Diagnosis not present

## 2023-10-28 DIAGNOSIS — I2111 ST elevation (STEMI) myocardial infarction involving right coronary artery: Secondary | ICD-10-CM | POA: Diagnosis not present

## 2023-10-28 DIAGNOSIS — Z9889 Other specified postprocedural states: Secondary | ICD-10-CM | POA: Diagnosis not present

## 2023-10-28 DIAGNOSIS — S59911A Unspecified injury of right forearm, initial encounter: Secondary | ICD-10-CM | POA: Diagnosis not present

## 2023-10-28 HISTORY — PX: LEFT HEART CATH AND CORONARY ANGIOGRAPHY: CATH118249

## 2023-10-28 HISTORY — PX: CORONARY/GRAFT ACUTE MI REVASCULARIZATION: CATH118305

## 2023-10-28 LAB — BASIC METABOLIC PANEL WITH GFR
Anion gap: 12 (ref 5–15)
BUN: 13 mg/dL (ref 8–23)
CO2: 25 mmol/L (ref 22–32)
Calcium: 9.1 mg/dL (ref 8.9–10.3)
Chloride: 102 mmol/L (ref 98–111)
Creatinine, Ser: 0.99 mg/dL (ref 0.61–1.24)
GFR, Estimated: >60 mL/min (ref >60)
Glucose, Bld: 138 mg/dL — ABNORMAL HIGH (ref 70–99)
Potassium: 4.1 mmol/L (ref 3.5–5.1)
Sodium: 139 mmol/L (ref 135–145)

## 2023-10-28 LAB — CBC
HCT: 44 % (ref 39.0–52.0)
Hemoglobin: 14.8 g/dL (ref 13.0–17.0)
MCH: 31.7 pg (ref 26.0–34.0)
MCHC: 33.6 g/dL (ref 30.0–36.0)
MCV: 94.2 fL (ref 80.0–100.0)
Platelets: 221 K/uL (ref 150–400)
RBC: 4.67 MIL/uL (ref 4.22–5.81)
RDW: 13.3 % (ref 11.5–15.5)
WBC: 12.2 K/uL — ABNORMAL HIGH (ref 4.0–10.5)
nRBC: 0 % (ref 0.0–0.2)

## 2023-10-28 LAB — PROTIME-INR
INR: 0.9 (ref 0.8–1.2)
Prothrombin Time: 13.1 s (ref 11.4–15.2)

## 2023-10-28 LAB — LIPID PANEL
Cholesterol: 143 mg/dL (ref 0–200)
HDL: 44 mg/dL (ref >40)
LDL Cholesterol: 63 mg/dL (ref 0–99)
Total CHOL/HDL Ratio: 3.3 ratio
Triglycerides: 180 mg/dL — ABNORMAL HIGH (ref <150)
VLDL: 36 mg/dL (ref 0–40)

## 2023-10-28 LAB — TROPONIN I (HIGH SENSITIVITY)
Troponin I (High Sensitivity): 6804 ng/L (ref <18)
Troponin I (High Sensitivity): 6555 ng/L (ref <18)

## 2023-10-28 LAB — TROPONIN T, HIGH SENSITIVITY: Troponin T High Sensitivity: 208 ng/L (ref <19)

## 2023-10-28 LAB — ECHOCARDIOGRAM COMPLETE
Area-P 1/2: 3.19 cm2
Height: 63 in
Weight: 2807.78 [oz_av]

## 2023-10-28 LAB — APTT: aPTT: 29 s (ref 24–36)

## 2023-10-28 SURGERY — THROMBECTOMY, ARTERY, BRACHIAL
Anesthesia: General | Laterality: Right

## 2023-10-28 SURGERY — CORONARY/GRAFT ACUTE MI REVASCULARIZATION
Anesthesia: Moderate Sedation

## 2023-10-28 MED ORDER — MIDAZOLAM HCL 2 MG/2ML IJ SOLN
INTRAMUSCULAR | Status: AC
Start: 2023-10-28 — End: 2023-10-28
  Filled 2023-10-28: qty 2

## 2023-10-28 MED ORDER — ASPIRIN 81 MG PO CHEW
CHEWABLE_TABLET | ORAL | Status: AC
  Filled 2023-10-28: qty 4

## 2023-10-28 MED ORDER — FENTANYL CITRATE (PF) 100 MCG/2ML IJ SOLN
INTRAMUSCULAR | Status: AC
  Filled 2023-10-28: qty 2

## 2023-10-28 MED ORDER — ONDANSETRON HCL 4 MG/2ML IJ SOLN
4.0000 mg | Freq: Four times a day (QID) | INTRAMUSCULAR | Status: DC | PRN
  Administered 2023-10-28: 4 mg via INTRAVENOUS
  Filled 2023-10-28: qty 2

## 2023-10-28 MED ORDER — TIROFIBAN HCL IN NACL 5-0.9 MG/100ML-% IV SOLN
0.1500 ug/kg/min | INTRAVENOUS | Status: AC
  Administered 2023-10-28: 0.15 ug/kg/min via INTRAVENOUS

## 2023-10-28 MED ORDER — ASPIRIN 81 MG PO CHEW
81.0000 mg | CHEWABLE_TABLET | Freq: Every day | ORAL | Status: DC
  Administered 2023-10-29: 81 mg via ORAL
  Filled 2023-10-28: qty 1

## 2023-10-28 MED ORDER — PERFLUTREN LIPID MICROSPHERE
1.0000 mL | INTRAVENOUS | Status: AC | PRN
  Administered 2023-10-28: 4 mL via INTRAVENOUS

## 2023-10-28 MED ORDER — TIROFIBAN (AGGRASTAT) BOLUS VIA INFUSION
INTRAVENOUS | Status: DC | PRN
  Administered 2023-10-28: 2025 ug via INTRAVENOUS

## 2023-10-28 MED ORDER — HEPARIN SODIUM (PORCINE) 5000 UNIT/ML IJ SOLN
INTRAMUSCULAR | Status: AC
  Filled 2023-10-28: qty 1

## 2023-10-28 MED ORDER — HEPARIN SODIUM (PORCINE) 1000 UNIT/ML IJ SOLN
INTRAMUSCULAR | Status: AC
  Filled 2023-10-28: qty 10

## 2023-10-28 MED ORDER — ACETAMINOPHEN 325 MG PO TABS: 650.0000 mg | ORAL_TABLET | ORAL | Status: DC | PRN

## 2023-10-28 MED ORDER — HYDRALAZINE HCL 20 MG/ML IJ SOLN
10.0000 mg | INTRAMUSCULAR | Status: AC | PRN
Start: 2023-10-28 — End: 2023-10-28

## 2023-10-28 MED ORDER — UMECLIDINIUM-VILANTEROL 62.5-25 MCG/ACT IN AEPB
1.0000 | INHALATION_SPRAY | Freq: Every day | RESPIRATORY_TRACT | Status: DC
  Filled 2023-10-28: qty 14

## 2023-10-28 MED ORDER — MIDAZOLAM HCL 2 MG/2ML IJ SOLN
INTRAMUSCULAR | Status: DC | PRN
  Administered 2023-10-28: 1 mg via INTRAVENOUS

## 2023-10-28 MED ORDER — IOHEXOL 350 MG/ML SOLN
INTRAVENOUS | Status: DC | PRN
  Administered 2023-10-28: 70 mL

## 2023-10-28 MED ORDER — SODIUM CHLORIDE 0.9% FLUSH
3.0000 mL | Freq: Two times a day (BID) | INTRAVENOUS | Status: DC
  Administered 2023-10-28 – 2023-10-29 (×3): 3 mL via INTRAVENOUS

## 2023-10-28 MED ORDER — LIDOCAINE HCL (PF) 1 % IJ SOLN
INTRAMUSCULAR | Status: AC
  Filled 2023-10-28: qty 30

## 2023-10-28 MED ORDER — LOSARTAN POTASSIUM 50 MG PO TABS
50.0000 mg | ORAL_TABLET | Freq: Every day | ORAL | Status: DC
  Filled 2023-10-28: qty 1

## 2023-10-28 MED ORDER — TICAGRELOR 90 MG PO TABS
ORAL_TABLET | ORAL | Status: AC
  Filled 2023-10-28: qty 2

## 2023-10-28 MED ORDER — VERAPAMIL HCL 2.5 MG/ML IV SOLN
INTRAVENOUS | Status: AC
  Filled 2023-10-28: qty 2

## 2023-10-28 MED ORDER — ROSUVASTATIN CALCIUM 20 MG PO TABS
40.0000 mg | ORAL_TABLET | Freq: Every day | ORAL | Status: DC
  Administered 2023-10-28 – 2023-10-29 (×2): 40 mg via ORAL
  Filled 2023-10-28 (×2): qty 2

## 2023-10-28 MED ORDER — SODIUM CHLORIDE 0.9 % IV SOLN: INTRAVENOUS | Status: AC

## 2023-10-28 MED ORDER — ASPIRIN 81 MG PO CHEW
324.0000 mg | CHEWABLE_TABLET | Freq: Once | ORAL | Status: AC
  Administered 2023-10-28: 324 mg via ORAL

## 2023-10-28 MED ORDER — ORAL CARE MOUTH RINSE: 15.0000 mL | OROMUCOSAL | Status: DC | PRN

## 2023-10-28 MED ORDER — LIDOCAINE HCL (PF) 1 % IJ SOLN
INTRAMUSCULAR | Status: DC | PRN
  Administered 2023-10-28: 2 mL via INTRADERMAL

## 2023-10-28 MED ORDER — SODIUM CHLORIDE 0.9 % IV SOLN: 250.0000 mL | INTRAVENOUS | Status: AC | PRN

## 2023-10-28 MED ORDER — HEPARIN (PORCINE) IN NACL 1000-0.9 UT/500ML-% IV SOLN
INTRAVENOUS | Status: DC | PRN
  Administered 2023-10-28 (×2): 500 mL

## 2023-10-28 MED ORDER — SODIUM CHLORIDE 0.9 % IV SOLN: INTRAVENOUS | Status: DC

## 2023-10-28 MED ORDER — SODIUM CHLORIDE 0.9% FLUSH: 3.0000 mL | INTRAVENOUS | Status: DC | PRN

## 2023-10-28 MED ORDER — HEPARIN SODIUM (PORCINE) 1000 UNIT/ML IJ SOLN
INTRAMUSCULAR | Status: DC | PRN
  Administered 2023-10-28: 10000 [IU] via INTRAVENOUS

## 2023-10-28 MED ORDER — LABETALOL HCL 5 MG/ML IV SOLN: 10.0000 mg | INTRAVENOUS | Status: AC | PRN

## 2023-10-28 MED ORDER — AMLODIPINE BESYLATE 5 MG PO TABS
5.0000 mg | ORAL_TABLET | Freq: Every day | ORAL | Status: DC
  Administered 2023-10-28: 5 mg via ORAL
  Filled 2023-10-28 (×2): qty 1

## 2023-10-28 MED ORDER — TICAGRELOR 90 MG PO TABS
90.0000 mg | ORAL_TABLET | Freq: Two times a day (BID) | ORAL | Status: DC
  Administered 2023-10-28 – 2023-10-29 (×2): 90 mg via ORAL
  Filled 2023-10-28 (×2): qty 1

## 2023-10-28 MED ORDER — TICAGRELOR 90 MG PO TABS
ORAL_TABLET | ORAL | Status: DC | PRN
  Administered 2023-10-28: 180 mg via ORAL

## 2023-10-28 MED ORDER — IOHEXOL 350 MG/ML SOLN
100.0000 mL | Freq: Once | INTRAVENOUS | Status: AC | PRN
  Administered 2023-10-28: 100 mL via INTRAVENOUS

## 2023-10-28 MED ORDER — VERAPAMIL HCL 2.5 MG/ML IV SOLN
INTRAVENOUS | Status: DC | PRN
  Administered 2023-10-28: 10 mL via INTRA_ARTERIAL

## 2023-10-28 MED ORDER — FENTANYL CITRATE (PF) 100 MCG/2ML IJ SOLN
INTRAMUSCULAR | Status: DC | PRN
  Administered 2023-10-28: 25 ug via INTRAVENOUS

## 2023-10-28 MED ORDER — TIROFIBAN HCL IN NACL 5-0.9 MG/100ML-% IV SOLN
INTRAVENOUS | Status: AC | PRN
  Administered 2023-10-28: .15 ug/kg/min via INTRAVENOUS

## 2023-10-28 MED ORDER — HEPARIN SODIUM (PORCINE) 5000 UNIT/ML IJ SOLN
60.0000 [IU]/kg | Freq: Once | INTRAMUSCULAR | Status: AC
  Administered 2023-10-28: 4000 [IU] via INTRAVENOUS

## 2023-10-28 SURGICAL SUPPLY — 12 items
BALLOON EMERGE MR 2.0X12 (BALLOONS) IMPLANT
BALLOON ~~LOC~~ EUPHORA RX 3.5X8 (BALLOONS) IMPLANT
CATH 5FR JL3.5 JR4 ANG PIG MP (CATHETERS) IMPLANT
CATH LAUNCHER 6FR JR4 (CATHETERS) IMPLANT
DEVICE RAD COMP TR BAND LRG (VASCULAR PRODUCTS) IMPLANT
GLIDESHEATH SLEND SS 6F .021 (SHEATH) IMPLANT
GUIDEWIRE INQWIRE 1.5J.035X260 (WIRE) IMPLANT
KIT ENCORE 26 ADVANTAGE (KITS) IMPLANT
PACK CARDIAC CATHETERIZATION (CUSTOM PROCEDURE TRAY) ×2 IMPLANT
SET ATX-X65L (MISCELLANEOUS) IMPLANT
STENT SYNERGY XD 3.0X12 (Permanent Stent) IMPLANT
WIRE ASAHI PROWATER 180CM (WIRE) IMPLANT

## 2023-10-28 NOTE — ED Notes (Signed)
 [facility] at bedside

## 2023-10-28 NOTE — H&P (Signed)
 Cardiology Admission History and Physical   Patient ID: EL PILE MRN: [id number]; DOB: 1950/10/29   Admission date: 10/28/2023  PCP:  [name], MD   [facility] Providers Cardiologist:  Former Claudene  Chief Complaint:  Chest pain  History of Present Illness: Martin Navarro  is 73 yo male with history of COPD, former tobacco abuse, HTN, HLD and CAD (coronary atherosclerosis noted on chest CT) who presented to the [facility] ED with c/o chest pain that woke him from his sleep at 1 am. EKG with inferior and anterolateral ST elevation. Code STEMI called by ED staff. Pt with no chest pain on arrival to [facility]. EKG with resolution of ST elevation.    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   Hypokalemia    Nausea    Nicotine abuse 10/25/2010   Prostatitis    Sepsis (HCC) 09/21/2016   Skin cancer    Past Surgical History:  Procedure Laterality Date   COLONOSCOPY  2010   2016   INGUINAL HERNIA REPAIR Right 04/11/2023   Procedure: OPEN RIGHT INGUINAL HERNIA REPAIR WITH MESH;  Surgeon: Vernetta Berg, MD;  Location: [facility];  Service: General;  Laterality: Right;   SKIN TAG REMOVAL  11/2019   TONSILLECTOMY       Medications Prior to Admission: Prior to Admission medications   Medication Sig Start Date End Date Taking? Authorizing Provider  amLODipine  (NORVASC ) 5 MG tablet TAKE ONE (1) TABLET BY MOUTH EVERY DAY 10/22/23  Yes Burchette, Wolm ORN, MD  omeprazole   (PRILOSEC) 40 MG capsule Take 1 capsule (40 mg total) by mouth daily. 01/19/23  Yes Burchette, Wolm ORN, MD  rosuvastatin  (CRESTOR ) 10 MG tablet TAKE ONE (1) TABLET BY MOUTH EVERY DAY 01/19/23  Yes Burchette, Wolm ORN, MD  ANORO ELLIPTA  62.5-25 MCG/ACT AEPB USE 1 INHALATION DAILY 06/28/23   Mannam, Praveen, MD  aspirin  81 MG tablet Take 81 mg by mouth daily.    [provider]  ELDERBERRY PO Take 1 tablet by mouth daily.    [provider]  losartan  (COZAAR ) 50 MG tablet TAKE ONE (1) TABLET BY MOUTH EVERY DAY 01/19/23   Burchette, Wolm ORN, MD  Omega-3 Fatty Acids (FISH OIL) 1200 MG CAPS Take 1,200 mg by mouth daily.    [provider]  oxymetazoline (AFRIN) 0.05 % nasal spray Place 2 sprays into both nostrils 2 (two) times daily as needed for congestion.    [provider]  traMADol  (ULTRAM ) 50 MG tablet Take 1 tablet (50 mg total) by mouth every 6 (six) hours as needed for moderate pain (pain score 4-6) or severe pain (pain score 7-10). 04/11/23   Vernetta Berg, MD  vitamin B-12 (CYANOCOBALAMIN ) 1000 MCG tablet Take 1,000-5,000 mcg by mouth daily.     [provider]  vitamin C (ASCORBIC ACID ) 500 MG tablet Take 500 mg by mouth daily.    [provider]  vitamin E  400 UNIT capsule Take 1,200 Units by mouth daily.    [provider]     Allergies:    Allergies  Allergen Reactions   Sulfa Antibiotics     Unknown    Social History:   Social History   Socioeconomic History   Marital status: Divorced    Spouse name: Not on file   Number of children: Not on file   Years of education: Not on file   Highest education level: Not on file  Occupational History   Not on file  Tobacco Use   Smoking status: Some Days    Current packs/day: 0.00    Average packs/day: 2.0 packs/day for 53.0 years (106.0 ttl pk-yrs)    Types: E-cigarettes, Cigarettes    Start date: 02/06/1962    Last attempt to quit: 04/26/2020    Years since  quitting: 3.5   Smokeless tobacco: Former   Tobacco comments:    Stopped tobacco and vape 03/2020  Vaping Use   Vaping status: Every Day  Substance and Sexual Activity   Alcohol use: No   Drug use: Not Currently   Sexual activity: Yes  Other Topics Concern   Not on file  Social History Narrative   Not on file   Social Drivers of Health   Financial Resource Strain: Not on file  Food Insecurity: No Food Insecurity (12/15/2021)   Hunger Vital Sign    Worried About Running Out of Food in the Last Year: Never true    Ran Out of Food in the Last Year: Never true  Transportation Needs: No Transportation Needs (12/15/2021)   PRAPARE - Administrator, Civil Service (Medical): No    Lack of Transportation (Non-Medical): No  Physical Activity: Not on file  Stress: Not on file  Social Connections: Not on file  Intimate Partner Violence: Not on file     Family History:   The patient's family history includes Heart disease (age of onset: 89) in his father; Hypertension in his father. There is no history of Colon cancer, Esophageal cancer, Rectal cancer, or Stomach cancer.    ROS:  Please see the history of present illness.  All other ROS reviewed and negative.     Physical Exam/Data: Vitals:   10/28/23 0355 10/28/23 0356 10/28/23 0359 10/28/23 0400  BP: 99/87   (!) 120/103  Pulse: (!) 59 67 61 70  Resp: 16 14 14 13   Temp:      TempSrc:      SpO2: 98% 97% 99% 96%  Weight:      Height:       No intake or output data in the 24 hours ending 10/28/23 0431    10/28/2023    3:52 AM 10/28/2023    3:29 AM 08/28/2023    8:51 AM  Last 3 Weights  Weight (lbs) 178 lb 9.2 oz 178 lb 9.2 oz 178 lb 8 oz  Weight (kg) 81 kg 81 kg 80.967 kg     Body mass index is 31.63 kg/m.  General:  Well nourished, well developed, in no acute distress HEENT: normal Neck: no JVD Vascular: No carotid bruits; Distal pulses 2+ bilaterally   Cardiac:  normal S1, S2; RRR; no murmur  Lungs:  clear to  auscultation bilaterally, no wheezing, rhonchi or rales  Abd: soft, nontender, no hepatomegaly  Ext: no LE edema Musculoskeletal:  No deformities, BUE and BLE strength normal and equal Skin: warm and dry  Neuro:  CNs 2-12  intact, no focal abnormalities noted Psych:  Normal affect   EKG:  The ECG that was done was personally reviewed and demonstrates sinus with 2 mm inferior ST elevation with ST elevation in leads V4-V6  Relevant CV Studies:   Laboratory Data: High Sensitivity Troponin:  No results for input(s): TROPONINIHS in the last 720 hours.    Chemistry Recent Labs  Lab 10/28/23 0332  NA 139  K 4.1  CL 102  CO2 25  GLUCOSE 138*  BUN 13  CREATININE 0.99  CALCIUM  9.1  GFRNONAA >60  ANIONGAP 12    No results for input(s): PROT, ALBUMIN, AST, ALT, ALKPHOS, BILITOT in the last 168 hours. Lipids No results for input(s): CHOL, TRIG, HDL, LABVLDL, LDLCALC, CHOLHDL in the last 168 hours. Hematology Recent Labs  Lab 10/28/23 0332  WBC 12.2*  RBC 4.67  HGB 14.8  HCT 44.0  MCV 94.2  MCH 31.7  MCHC 33.6  RDW 13.3  PLT 221   Thyroid  No results for input(s): TSH, FREET4 in the last 168 hours. BNPNo results for input(s): BNP, PROBNP in the last 168 hours.  DDimer No results for input(s): DDIMER in the last 168 hours.  Radiology/Studies:  DG Chest Port 1 View Result Date: 10/28/2023 EXAM: 1 VIEW XRAY OF THE CHEST 10/28/2023 03:49:00 AM COMPARISON: None available. CLINICAL HISTORY: 213373 STEMI (ST elevation myocardial infarction) (HCC) 786626. STEMI STEMI (ST elevation myocardial infarction) (HCC) 786626. STEMI FINDINGS: LUNGS AND PLEURA: No focal pulmonary opacity. No pulmonary edema. No pleural effusion. No pneumothorax. HEART AND MEDIASTINUM: No acute abnormality of the cardiac and mediastinal silhouettes. BONES AND SOFT TISSUES: No acute osseous abnormality. IMPRESSION: 1. No acute process. Electronically signed by: Norman Gatlin MD  10/28/2023 03:56 AM EDT RP Workstation: [id number]     Assessment and Plan: Acute inferior STEMI: Plan emergent cardiac cath.   Code Status: Full Code  Severity of Illness: The appropriate patient status for this patient is INPATIENT. Inpatient status is judged to be reasonable and necessary in order to provide the required intensity of service to ensure the patient's safety. The patient's presenting symptoms, physical exam findings, and initial radiographic and laboratory data in the context of their chronic comorbidities is felt to place them at high risk for further clinical deterioration. Furthermore, it is not anticipated that the patient will be medically stable for discharge from the hospital within 2 midnights of admission.   * I certify that at the point of admission it is my clinical judgment that the patient will require inpatient hospital care spanning beyond 2 midnights from the point of admission due to high intensity of service, high risk for further deterioration and high frequency of surveillance required.*  For questions or updates, please contact [facility] Please consult [email or web address] for contact info under     Signed, [name], MD  10/28/2023 4:31 AM

## 2023-10-28 NOTE — Consult Note (Signed)
 [facility] OF [place]  ASSESSMENT / PLAN: 73 y.o. male with right antecubital swelling and pain after discontinuation of an IV.  The patient did have ipsilateral right radial artery access for coronary artery intervention for STEMI, but there is no swelling or tenderness around the radial artery and the right wrist.  Initially I was concerned that the patient may have had an inadvertent arterial cannulation with the peripheral IV.  CT angiogram of the extremity was performed.  During serial reevaluation after my initial evaluation, the patient showed marked improvement in the hand and arm with near resolution of paresthesias and pain.  I personally reviewed this study.  I reviewed it with the reading radiologist Dr. Joan as well.  Nonspecific swelling is seen at the area of clinical concern.  There is some haziness proximally in the artery, which I do not think is clinically relevant.   Additional serial evaluation x 2 was performed which showed continued improvement of the upper extremity.  At this time, I do not feel he has a threatening problem.  I will plan to see him again later this afternoon and in the morning to confirm my suspicions.  At this point I think this is a simple IV infiltration.  No concern for access related problem after recent coronary intervention.  CHIEF COMPLAINT: Hand and arm swelling after cardiac cath  HISTORY OF PRESENT ILLNESS: Martin Navarro is a 73 y.o. male admitted to intensive care unit after cardiac catheterization for STEMI via right radial access.  Successful revascularization of a proximal RCA lesion was performed.  Routine post radial access care was performed.  The patient did have a peripheral IV placed in the antecubital fossa during his initial workup for STEMI.  This had some associated swelling and so the ICU nurse discontinued that, worried that an IV infiltration was occurring.  She did note that the IV could still drawback  briskly.  Shortly after discontinuing the IV, there was significant swelling in the left forearm about the antecubital fossa.  The patient had significant pain in this area and paresthesias in the fingers.  I was called to evaluate the patient for concern of possible vascular injury.  Initially, I shared concern for vascular injury and elected to perform a CT angiogram to better evaluate the vascular tree as I could not evaluate on bedside ultrasound.  On serial evaluation while awaiting the CT angiogram formal read, the patient showed marked clinical improvement with resolution of paresthesias in the hand and softening of the forearm.  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   Hypokalemia    Nausea    Nicotine abuse 10/25/2010   Prostatitis    Sepsis (HCC) 09/21/2016   Skin cancer     Past Surgical History:  Procedure Laterality Date   COLONOSCOPY  2010   2016   INGUINAL HERNIA REPAIR Right 04/11/2023   Procedure: OPEN RIGHT INGUINAL HERNIA REPAIR  WITH MESH;  Surgeon: Vernetta Berg, MD;  Location: [facility];  Service: General;  Laterality: Right;   SKIN TAG REMOVAL  11/2019   TONSILLECTOMY      Family History  Problem Relation Age of Onset   Hypertension Father    Heart disease Father 64       CABG   Colon cancer Neg Hx    Esophageal cancer Neg Hx    Rectal cancer Neg Hx    Stomach cancer Neg Hx     Social History   Socioeconomic History   Marital status: Divorced     Spouse name: Not on file   Number of children: Not on file   Years of education: Not on file   Highest education level: Not on file  Occupational History   Not on file  Tobacco Use   Smoking status: Some Days    Current packs/day: 0.00    Average packs/day: 2.0 packs/day for 53.0 years (106.0 ttl pk-yrs)    Types: E-cigarettes, Cigarettes    Start date: 02/06/1962    Last attempt to quit: 04/26/2020    Years since quitting: 3.5   Smokeless tobacco: Former   Tobacco comments:    Stopped tobacco and vape 03/2020  Vaping Use   Vaping status: Every Day  Substance and Sexual Activity   Alcohol use: No   Drug use: Not Currently   Sexual activity: Yes  Other Topics Concern   Not on file  Social History Narrative   Not on file   Social Drivers of Health   Financial Resource Strain: Not on file  Food Insecurity: No Food Insecurity (12/15/2021)   Hunger Vital Sign    Worried About Running Out of Food in the Last Year: Never true    Ran Out of Food in the Last Year: Never true  Transportation Needs: No Transportation Needs (12/15/2021)   PRAPARE - Administrator, Civil Service (Medical): No    Lack of Transportation (Non-Medical): No  Physical Activity: Not on file  Stress: Not on file  Social Connections: Not on file  Intimate Partner Violence: Not on file    Allergies  Allergen Reactions   Sulfa Antibiotics     Unknown    Current Facility-Administered Medications  Medication Dose Route Frequency Provider Last Rate Last Admin   0.9 %  sodium chloride  infusion   Intravenous Continuous Rancour, Stephen, MD 20 mL/hr at 10/28/23 0701 Infusion Verify at 10/28/23 0701   0.9 %  sodium chloride  infusion   Intravenous Continuous Verlin Lonni BIRCH, MD       0.9 %  sodium chloride  infusion  250 mL Intravenous PRN Verlin Lonni BIRCH, MD       acetaminophen  (TYLENOL ) tablet 650 mg  650 mg Oral Q4H PRN Verlin Lonni BIRCH, MD       amLODipine  (NORVASC ) tablet 5  mg  5 mg Oral Daily Verlin Lonni BIRCH, MD       [START ON 10/29/2023] aspirin  chewable tablet 81 mg  81 mg Oral Daily Verlin Lonni BIRCH, MD       hydrALAZINE  (APRESOLINE ) injection 10 mg  10 mg Intravenous Q20 Min PRN Verlin Lonni BIRCH, MD       labetalol  (NORMODYNE ) injection 10 mg  10 mg Intravenous Q10 min PRN Verlin Lonni BIRCH, MD       [START ON 10/29/2023] losartan  (COZAAR ) tablet 50 mg  50 mg Oral Daily Verlin Lonni BIRCH, MD  ondansetron  (ZOFRAN ) injection 4 mg  4 mg Intravenous Q6H PRN Verlin Lonni BIRCH, MD   4 mg at 10/28/23 0930   Oral care mouth rinse  15 mL Mouth Rinse PRN Verlin Lonni BIRCH, MD       perflutren  lipid microspheres (DEFINITY ) IV suspension  1-10 mL Intravenous PRN Verlin Lonni BIRCH, MD   4 mL at 10/28/23 9078   rosuvastatin  (CRESTOR ) tablet 40 mg  40 mg Oral Daily Verlin Lonni BIRCH, MD       sodium chloride  flush (NS) 0.9 % injection 3 mL  3 mL Intravenous Q12H Verlin Lonni BIRCH, MD       sodium chloride  flush (NS) 0.9 % injection 3 mL  3 mL Intravenous PRN Verlin Lonni BIRCH, MD       ticagrelor  (BRILINTA ) tablet 90 mg  90 mg Oral BID Verlin Lonni BIRCH, MD       umeclidinium-vilanterol (ANORO ELLIPTA ) 62.5-25 MCG/ACT 1 puff  1 puff Inhalation Daily Verlin Lonni BIRCH, MD        PHYSICAL EXAM Vitals:   10/28/23 0600 10/28/23 0700 10/28/23 0900 10/28/23 1000  BP: (!) 81/56 (!) 75/60 103/72 96/78  Pulse: (!) 52 (!) 55 (!) 52 (!) 57  Resp: 16 16 12 19   Temp:      TempSrc:      SpO2: 94% 92% 99% 97%  Weight:      Height:       Elderly man in no distress Regular rate and rhythm Unlabored breathing Brisk Doppler flow in the radial, ulnar, and palmar arch No evidence of access site issue in the right radial artery Swelling and tenderness in the right antecubital fossa about the mobile wad around the area of venipuncture Bedside ultrasound performed with difficult visualization of the  brachial artery and the segment of clinical concern.  Arteries above and below the area of concern appeared normal.  PERTINENT LABORATORY AND RADIOLOGIC DATA  Most recent CBC    Latest Ref Rng & Units 10/28/2023    3:32 AM 01/19/2023   11:03 AM 07/18/2021    9:27 AM  CBC  WBC 4.0 - 10.5 K/uL 12.2  8.9  10.7   Hemoglobin 13.0 - 17.0 g/dL 85.1  85.6  86.1   Hematocrit 39.0 - 52.0 % 44.0  43.7  41.3   Platelets 150 - 400 K/uL 221  256.0  212.0      Most recent CMP    Latest Ref Rng & Units 10/28/2023    3:32 AM 01/19/2023   11:03 AM 01/16/2022    9:36 AM  CMP  Glucose 70 - 99 mg/dL 861  891  876   BUN 8 - 23 mg/dL 13  11  16    Creatinine 0.61 - 1.24 mg/dL 9.00  9.13  9.21   Sodium 135 - 145 mmol/L 139  141  141   Potassium 3.5 - 5.1 mmol/L 4.1  4.7  4.6   Chloride 98 - 111 mmol/L 102  106  107   CO2 22 - 32 mmol/L 25  29  27    Calcium  8.9 - 10.3 mg/dL 9.1  9.3  9.1   Total Protein 6.0 - 8.3 g/dL  6.7  6.9   Total Bilirubin 0.2 - 1.2 mg/dL  0.8  0.7   Alkaline Phos 39 - 117 U/L  68  61   AST 0 - 37 U/L  14  15   ALT 0 - 53 U/L  13  17  Renal function Estimated Creatinine Clearance: 61.1 mL/min (by C-G formula based on SCr of 0.99 mg/dL).  Hgb A1c MFr Bld (%)  Date Value  01/19/2023 6.1    LDL Cholesterol (Calc)  Date Value Ref Range Status  12/24/2019 74 mg/dL (calc) Final    Comment:    Reference range: <100 . Desirable range <100 mg/dL for primary prevention;   <70 mg/dL for patients with CHD or diabetic patients  with > or = 2 CHD risk factors. SABRA LDL-C is now calculated using the Martin-Hopkins  calculation, which is a validated novel method providing  better accuracy than the Friedewald equation in the  estimation of LDL-C.  Gladis APPLETHWAITE et al. SANDREA. 7986;689(80): 2061-2068  (http://education.[employer].com/faq/FAQ164)    LDL Cholesterol  Date Value Ref Range Status  10/28/2023 63 0 - 99 mg/dL Final    Comment:           Total Cholesterol/HDL:CHD  Risk Coronary Heart Disease Risk Table                     Men   Women  1/2 Average Risk   3.4   3.3  Average Risk       5.0   4.4  2 X Average Risk   9.6   7.1  3 X Average Risk  23.4   11.0        Use the calculated Patient Ratio above and the CHD Risk Table to determine the patient's CHD Risk.        ATP III CLASSIFICATION (LDL):  <100     mg/dL   Optimal  899-870  mg/dL   Near or Above                    Optimal  130-159  mg/dL   Borderline  839-810  mg/dL   High  >809     mg/dL   Very High Performed at [facility], [address] [place], [place] [address]    Direct LDL  Date Value Ref Range Status  01/03/2021 76.0 mg/dL Final    Comment:    Optimal:  <100 mg/dLNear or Above Optimal:  100-129 mg/dLBorderline High:  130-159 mg/dLHigh:  160-189 mg/dLVery High:  >190 mg/dL    CT angiogram of the right upper extremity personally reviewed.  No evidence of vascular injury.  There is some swelling and haziness about the antecubital fossa.  Debby SAILOR. Magda, MD FACS [facility] Phone Number: [phone] 10/28/2023 10:51 AM   Total time spent on preparing this encounter including chart review, data review, collecting history, examining the patient, and coordinating care: 60 minutes  Portions of this report may have been transcribed using voice recognition software.  Every effort has been made to ensure accuracy; however, inadvertent computerized transcription errors may still be present.

## 2023-10-28 NOTE — ED Triage Notes (Signed)
 Patient arrives with chest pain starting at 0100. He states it goes across his chest but denies it radiation to the arms, back, or chest. He said it felt hot when it first happened. He reports having high cholesterol and blood pressure. He also takes omeprazole . He states he has no history of MI. He rates the pain 8/10.  Also reports sore throat and feeling poorly all week. Has an appointment with ENT.

## 2023-10-28 NOTE — ED Notes (Signed)
 Radiolucent pads not available - regular zoll pads placed on pt

## 2023-10-28 NOTE — ED Notes (Signed)
 Pts daughter angie at bedside

## 2023-10-28 NOTE — Progress Notes (Signed)
 Serial exam performed around 3pm. Patient doing well overall. Forearm remains edematous, but soft. Normal vascular and neurologic exam of the hand. Will check again in the morning. Asked him to inform nurse if his arm feels any worse and I will re-evaluate.  Debby SAILOR. Magda, MD [facility] of [facility] Phone Number: [phone] 10/28/2023 3:23 PM

## 2023-10-28 NOTE — ED Provider Notes (Signed)
 [place] EMERGENCY DEPARTMENT AT [facility] Provider Note   CSN: [id number] Arrival date & time: 10/28/23  9676     Patient presents with: Chest Pain   Martin Navarro is a 73 y.o. male.   Patient presents with central chest pain that woke him from sleep about 1 AM.  Felt well going to bed.  Describes pain and pressure across his chest to both shoulders.  Associate with feeling hot.  Pain is starting to improve since arriving to the ED.  Denies any nausea, vomiting, diaphoresis.  Some shortness of breath.  Denies any cardiac history.  He has never had this kind of pain in the past. Negative stress test in 2021.  Daughter mentions ongoing throat discomfort for several months despite negative workup by his PCP.  No change with this today.  The history is provided by the patient and a relative. The history is limited by the condition of the patient.  Chest Pain Associated symptoms: nausea and shortness of breath   Associated symptoms: no abdominal pain, no dizziness, no headache, no vomiting and no weakness        Prior to Admission medications   Medication Sig Start Date End Date Taking? Authorizing Provider  amLODipine  (NORVASC ) 5 MG tablet TAKE ONE (1) TABLET BY MOUTH EVERY DAY 10/22/23   Burchette, Wolm ORN, MD  ANORO ELLIPTA  62.5-25 MCG/ACT AEPB USE 1 INHALATION DAILY 06/28/23   Mannam, Praveen, MD  aspirin  81 MG tablet Take 81 mg by mouth daily.    [provider]  ELDERBERRY PO Take 1 tablet by mouth daily.    [provider]  losartan  (COZAAR ) 50 MG tablet TAKE ONE (1) TABLET BY MOUTH EVERY DAY 01/19/23   Burchette, Wolm ORN, MD  Omega-3 Fatty Acids (FISH OIL) 1200 MG CAPS Take 1,200 mg by mouth daily.    [provider]  omeprazole  (PRILOSEC) 40 MG capsule Take 1 capsule (40 mg total) by mouth daily. 01/19/23   Burchette, Wolm ORN, MD  oxymetazoline (AFRIN) 0.05 % nasal spray Place 2 sprays into both nostrils 2 (two) times daily as needed for  congestion.    [provider]  rosuvastatin  (CRESTOR ) 10 MG tablet TAKE ONE (1) TABLET BY MOUTH EVERY DAY 01/19/23   Burchette, Wolm ORN, MD  traMADol  (ULTRAM ) 50 MG tablet Take 1 tablet (50 mg total) by mouth every 6 (six) hours as needed for moderate pain (pain score 4-6) or severe pain (pain score 7-10). 04/11/23   Vernetta Berg, MD  vitamin B-12 (CYANOCOBALAMIN ) 1000 MCG tablet Take 1,000-5,000 mcg by mouth daily.     [provider]  vitamin C (ASCORBIC ACID ) 500 MG tablet Take 500 mg by mouth daily.    [provider]  vitamin E  400 UNIT capsule Take 1,200 Units by mouth daily.    [provider]    Allergies: Sulfa antibiotics    Review of Systems  Constitutional:  Negative for activity change and appetite change.  HENT:  Negative for congestion and rhinorrhea.   Respiratory:  Positive for chest tightness and shortness of breath.   Cardiovascular:  Positive for chest pain.  Gastrointestinal:  Positive for nausea. Negative for abdominal pain and vomiting.  Genitourinary:  Negative for dysuria and hematuria.  Musculoskeletal:  Negative for arthralgias and myalgias.  Skin:  Negative for rash.  Neurological:  Negative for dizziness, weakness and headaches.   all other systems are negative except as noted in the HPI and PMH.  Updated Vital Signs BP (!) 128/104   Pulse 65   Temp 97.9 F (36.6 C) (Oral)   Resp 16   Wt 81 kg Comment: wt from 08/2023  SpO2 99%   BMI 31.63 kg/m   Physical Exam Vitals and nursing note reviewed.  Constitutional:      General: He is not in acute distress.    Appearance: He is well-developed.  HENT:     Head: Normocephalic and atraumatic.     Mouth/Throat:     Pharynx: No oropharyngeal exudate.  Eyes:     Conjunctiva/sclera: Conjunctivae normal.     Pupils: Pupils are equal, round, and reactive to light.  Neck:     Comments: No meningismus. Cardiovascular:     Rate and Rhythm: Normal rate and regular  rhythm.     Heart sounds: Normal heart sounds. No murmur heard. Pulmonary:     Effort: Pulmonary effort is normal. No respiratory distress.     Breath sounds: Normal breath sounds.  Chest:     Chest wall: No tenderness.  Abdominal:     Palpations: Abdomen is soft.     Tenderness: There is no abdominal tenderness. There is no guarding or rebound.  Musculoskeletal:        General: No tenderness. Normal range of motion.     Cervical back: Normal range of motion and neck supple.     Comments: Symmetric radial, DP pulses bilaterally  Skin:    General: Skin is warm.  Neurological:     Mental Status: He is alert and oriented to person, place, and time.     Motor: No abnormal muscle tone.     Comments:  5/5 strength throughout. CN 2-12 intact.Equal grip strength.   Psychiatric:        Behavior: Behavior normal.     (all labs ordered are listed, but only abnormal results are displayed) Labs Reviewed  CBC - Abnormal; Notable for the following components:      Result Value   WBC 12.2 (*)    All other components within normal limits  BASIC METABOLIC PANEL WITH GFR  HEMOGLOBIN A1C  PROTIME-INR  APTT  LIPID PANEL  TROPONIN T, HIGH SENSITIVITY    EKG: EKG Interpretation Date/Time:  Sunday October 28 2023 03:29:19 EDT Ventricular Rate:  59 PR Interval:  221 QRS Duration:  97 QT Interval:  376 QTC Calculation: 373 R Axis:   45  Text Interpretation: Sinus rhythm Prolonged PR interval Inferior infarct, acute (RCA) Minimal ST elevation, anterior leads Lateral leads are also involved Probable RV involvement, suggest recording right precordial leads Baseline wander in lead(s) V1 >>> Acute MI <<< inferior lateral STEMI Confirmed by Carita Senior [phone]) on 10/28/2023 3:43:13 AM  Radiology: No results found.   .Critical Care  Performed by: Carita Senior, MD Authorized by: Carita Senior, MD   Critical care provider statement:    Critical care time (minutes):  30   Critical  care time was exclusive of:  Separately billable procedures and treating other patients   Critical care was necessary to treat or prevent imminent or life-threatening deterioration of the following conditions: STEMI.   Critical care was time spent personally by me on the following activities:  Development of treatment plan with patient or surrogate, discussions with consultants, evaluation of patient's response to treatment, examination of patient, ordering and review of laboratory studies, ordering and review of radiographic studies, ordering and performing treatments and interventions, pulse oximetry, re-evaluation of patient's condition, review of old charts,  blood draw for specimens and obtaining history from patient or surrogate   I assumed direction of critical care for this patient from another provider in my specialty: no     Care discussed with: admitting provider      Medications Ordered in the ED  0.9 %  sodium chloride  infusion ( Intravenous New Bag/Given 10/28/23 0340)  heparin  5000 UNIT/ML injection (has no administration in time range)  aspirin  chewable tablet 324 mg (324 mg Oral Given 10/28/23 0336)  heparin  injection 60 Units/kg (4,000 Units Intravenous Given 10/28/23 9662)                                    Medical Decision Making Amount and/or Complexity of Data Reviewed Independent Historian: caregiver Labs: ordered. Decision-making details documented in ED Course. Radiology: ordered and independent interpretation performed. Decision-making details documented in ED Course. ECG/medicine tests: ordered and independent interpretation performed. Decision-making details documented in ED Course.  Risk OTC drugs. Prescription drug management.   Central chest pain that woke him from sleep about 1 AM.  Stable vitals on arrival.  No distress.  EKG is concerning for inferior lateral STEMI.  Code STEMI activated.  Patient given aspirin  and heparin .  Discussed with Dr. Verlin of  cardiology who agrees with emergent transfer to Cath Lab.  EMS activated.  Blood pressure and mental status remain stable throughout ED course.     Final diagnoses:  STEMI involving right coronary artery [facility])    ED Discharge Orders     None          Simranjit Thayer, Garnette, MD 10/28/23 [phone]

## 2023-10-28 NOTE — Progress Notes (Signed)
 Echocardiogram 2D Echocardiogram has been performed.  Martin Navarro [name] RDCS 10/28/2023, 9:21 AM

## 2023-10-28 NOTE — Progress Notes (Addendum)
 Patient ID: Martin Navarro, male   DOB: 09/03/1950, 73 y.o.   MRN: [id number]     Advanced Heart Failure Rounding Note  Cardiologist: Powell FORBES Sorrow, MD (Inactive)  Chief Complaint: STEMI Subjective:    Inferior STEMI overnight, 99% pRCA treated with DES.   No chest pain this morning.  After IV removed from right antecubital fossa, significant swelling of right forearm and tingling of thumb/index/middle fingers.   ECG with resolution of inferior STE.   Objective:   Weight Range: 79.6 kg Body mass index is 31.09 kg/m.   Vital Signs:   Temp:  [97.9 F (36.6 C)] 97.9 F (36.6 C) (08/03 0543) Pulse Rate:  [52-74] 55 (08/03 0700) Resp:  [11-32] 16 (08/03 0700) BP: (75-128)/(56-104) 75/60 (08/03 0700) SpO2:  [92 %-100 %] 92 % (08/03 0700) Weight:  [79.6 kg-81 kg] 79.6 kg (08/03 0543) Last BM Date :  (pta)  Weight change: Filed Weights   10/28/23 0329 10/28/23 0352 10/28/23 0543  Weight: 81 kg 81 kg 79.6 kg    Intake/Output:   Intake/Output Summary (Last 24 hours) at 10/28/2023 0818 Last data filed at 10/28/2023 0701 Gross per 24 hour  Intake 98.2 ml  Output --  Net 98.2 ml      Physical Exam    General:  Well appearing. No resp difficulty HEENT: Normal Neck: Supple. JVP not elevated. Carotids 2+ bilat; no bruits. No lymphadenopathy or thyromegaly appreciated. Cor: PMI nondisplaced. Regular rate & rhythm. No rubs, gallops or murmurs. Lungs: Clear Abdomen: Soft, nontender, nondistended. No hepatosplenomegaly. No bruits or masses. Good bowel sounds. Extremities: Swelling of right forearm, there does not appear to be a significant hematoma.  Neuro: Alert & orientedx3, cranial nerves grossly intact. moves all 4 extremities w/o difficulty. Affect pleasant   Telemetry   NSR 70s (personally reviewed)  EKG    NSR, mostly resolved inferior STE (personally reviewed)  Labs    CBC Recent Labs    10/28/23 0332  WBC 12.2*  HGB 14.8  HCT 44.0  MCV 94.2  PLT 221    Basic Metabolic Panel Recent Labs    91/96/74 0332  NA 139  K 4.1  CL 102  CO2 25  GLUCOSE 138*  BUN 13  CREATININE 0.99  CALCIUM  9.1   Liver Function Tests No results for input(s): AST, ALT, ALKPHOS, BILITOT, PROT, ALBUMIN in the last 72 hours. No results for input(s): LIPASE, AMYLASE in the last 72 hours. Cardiac Enzymes No results for input(s): CKTOTAL, CKMB, CKMBINDEX, TROPONINI in the last 72 hours.  BNP: BNP (last 3 results) No results for input(s): BNP in the last 8760 hours.  ProBNP (last 3 results) No results for input(s): PROBNP in the last 8760 hours.   D-Dimer No results for input(s): DDIMER in the last 72 hours. Hemoglobin A1C No results for input(s): HGBA1C in the last 72 hours. Fasting Lipid Panel No results for input(s): CHOL, HDL, LDLCALC, TRIG, CHOLHDL, LDLDIRECT in the last 72 hours. Thyroid  Function Tests No results for input(s): TSH, T4TOTAL, T3FREE, THYROIDAB in the last 72 hours.  Invalid input(s): FREET3  Other results:   Imaging    CARDIAC CATHETERIZATION Result Date: 10/28/2023   Prox RCA lesion is 99% stenosed.   Mid LAD lesion is 30% stenosed.   A drug-eluting stent was successfully placed using a STENT SYNERGY XD 3.0X12.   Post intervention, there is a 0% residual stenosis. Acute inferior STEMI secondary to severe stenosis of the proximal RCA Successful PTCA/DES x proximal RCA  Mild non-obstructive disease in the mid LAD LVEDP 14 mm Hg Recommendations: Admit to the ICU. Aggrastat  infusion for 2 hours. DAPT with ASA and Brilinta  for one year. High intensity statin. Echo later today. Fast track discharge.   DG Chest Port 1 View Result Date: 10/28/2023 EXAM: 1 VIEW XRAY OF THE CHEST 10/28/2023 03:49:00 AM COMPARISON: None available. CLINICAL HISTORY: 213373 STEMI (ST elevation myocardial infarction) (HCC) 786626. STEMI STEMI (ST elevation myocardial infarction) (HCC) 786626. STEMI  FINDINGS: LUNGS AND PLEURA: No focal pulmonary opacity. No pulmonary edema. No pleural effusion. No pneumothorax. HEART AND MEDIASTINUM: No acute abnormality of the cardiac and mediastinal silhouettes. BONES AND SOFT TISSUES: No acute osseous abnormality. IMPRESSION: 1. No acute process. Electronically signed by: Norman Gatlin MD 10/28/2023 03:56 AM EDT RP Workstation: [id number]     Medications:     Scheduled Medications:  amLODipine   5 mg Oral Daily   [START ON 10/29/2023] aspirin   81 mg Oral Daily   [START ON 10/29/2023] losartan   50 mg Oral Daily   rosuvastatin   40 mg Oral Daily   sodium chloride  flush  3 mL Intravenous Q12H   ticagrelor   90 mg Oral BID   umeclidinium-vilanterol  1 puff Inhalation Daily    Infusions:  sodium chloride  20 mL/hr at 10/28/23 0701   sodium chloride      sodium chloride       PRN Medications: sodium chloride , acetaminophen , hydrALAZINE , labetalol , ondansetron  (ZOFRAN ) IV, mouth rinse, sodium chloride  flush    Assessment/Plan   1. CAD: Strong FH CAD and occasional smoking.  Admitted this morning with acute inferior STEMI, now s/p DES to proximal RCA.  Minimal disease in left system, LVEDP 14.  - Continue ASA 81 and ticagrelor .  - Increased Crestor  to 40 mg daily.  - Echo today.  2. HTN: Restarted home amlodipine  and losartan .  3. Smoking: Still occasional cigarette.  Recommended complete cessation.  4. Right arm swelling: Right forearm is swollen after removing IV in antecubital fossa.  This does not look like a hematoma.  Thumb/index/middle fingers tingling.   - Working on getting the TR band off.   - Once band off, doppler forearm pulses.  - Keep forearm elevated with warm compress.  - Can get vascular to assess for radial occlusion if no pulses by doppler.   If no vascular complications in right forearm, should be stable for discharge in am.   Length of Stay: 0  Ezra Shuck, MD  10/28/2023, 8:18 AM  Advanced Heart Failure Team Pager  [phone] (M-F; 7a - 5p)  Please contact [facility] for night-coverage after hours (5p -7a ) and weekends on [email or web address]  Right forearm remains very swollen after TR band off.  He does have dopplerable radial pulse.  Given amount of swelling, have consulted vascular.   Ezra Shuck 10/28/2023 9:34 AM

## 2023-10-29 ENCOUNTER — Encounter (HOSPITAL_COMMUNITY): Payer: Self-pay | Admitting: Cardiovascular Disease

## 2023-10-29 ENCOUNTER — Other Ambulatory Visit (HOSPITAL_COMMUNITY): Payer: Self-pay

## 2023-10-29 ENCOUNTER — Telehealth (HOSPITAL_COMMUNITY): Payer: Self-pay | Admitting: Pharmacy Technician

## 2023-10-29 DIAGNOSIS — I2111 ST elevation (STEMI) myocardial infarction involving right coronary artery: Principal | ICD-10-CM

## 2023-10-29 LAB — CBC
HCT: 36.3 % — ABNORMAL LOW (ref 39.0–52.0)
Hemoglobin: 12 g/dL — ABNORMAL LOW (ref 13.0–17.0)
MCH: 31.2 pg (ref 26.0–34.0)
MCHC: 33.1 g/dL (ref 30.0–36.0)
MCV: 94.3 fL (ref 80.0–100.0)
Platelets: 198 K/uL (ref 150–400)
RBC: 3.85 MIL/uL — ABNORMAL LOW (ref 4.22–5.81)
RDW: 13.4 % (ref 11.5–15.5)
WBC: 14.2 K/uL — ABNORMAL HIGH (ref 4.0–10.5)
nRBC: 0 % (ref 0.0–0.2)

## 2023-10-29 LAB — POCT ACTIVATED CLOTTING TIME: Activated Clotting Time: 423 s

## 2023-10-29 LAB — BASIC METABOLIC PANEL WITH GFR
Anion gap: 9 (ref 5–15)
BUN: 12 mg/dL (ref 8–23)
CO2: 24 mmol/L (ref 22–32)
Calcium: 8.4 mg/dL — ABNORMAL LOW (ref 8.9–10.3)
Chloride: 106 mmol/L (ref 98–111)
Creatinine, Ser: 0.9 mg/dL (ref 0.61–1.24)
GFR, Estimated: >60 mL/min (ref >60)
Glucose, Bld: 109 mg/dL — ABNORMAL HIGH (ref 70–99)
Potassium: 3.9 mmol/L (ref 3.5–5.1)
Sodium: 139 mmol/L (ref 135–145)

## 2023-10-29 LAB — CG4 I-STAT (LACTIC ACID): Lactic Acid, Venous: 1.1 mmol/L (ref 0.5–1.9)

## 2023-10-29 LAB — HEMOGLOBIN A1C
Hgb A1c MFr Bld: 6.1 % — ABNORMAL HIGH (ref 4.8–5.6)
Mean Plasma Glucose: 128 mg/dL

## 2023-10-29 MED ORDER — LOSARTAN POTASSIUM 50 MG PO TABS: 50.0000 mg | ORAL_TABLET | Freq: Every day | ORAL | Status: DC

## 2023-10-29 MED ORDER — NITROGLYCERIN 0.4 MG SL SUBL
0.4000 mg | SUBLINGUAL_TABLET | SUBLINGUAL | 1 refills | Status: AC | PRN
  Filled 2023-10-29: qty 25, 10d supply, fill #0

## 2023-10-29 MED ORDER — ROSUVASTATIN CALCIUM 20 MG PO TABS: 40.0000 mg | ORAL_TABLET | Freq: Every day | ORAL | Status: DC

## 2023-10-29 MED ORDER — TICAGRELOR 90 MG PO TABS
90.0000 mg | ORAL_TABLET | Freq: Two times a day (BID) | ORAL | 11 refills | Status: DC
  Filled 2023-10-29: qty 60, 30d supply, fill #0

## 2023-10-29 MED ORDER — CHLORHEXIDINE GLUCONATE CLOTH 2 % EX PADS: 6.0000 | MEDICATED_PAD | Freq: Every day | CUTANEOUS | Status: DC

## 2023-10-29 MED ORDER — ROSUVASTATIN CALCIUM 40 MG PO TABS
40.0000 mg | ORAL_TABLET | Freq: Every day | ORAL | 3 refills | Status: DC
  Filled 2023-10-29: qty 90, 90d supply, fill #0

## 2023-10-29 NOTE — Progress Notes (Signed)
  Progress Note    10/29/2023 6:48 AM Hospital Day 1  Subjective:  says his arm is feeling better.  Family member at bedside says his arm is softer this morning.  He is ready to go home.   afebrile  Vitals:   10/29/23 0500 10/29/23 0600  BP:    Pulse: 62 (!) 58  Resp: 15 14  Temp:    SpO2: (!) 89% 91%    Physical Exam: General:  resting comfortably; no distress Lungs:  non labored Extremities:  right arm with right arm swelling with ecchymosis around the antecubital space.  Arm is soft.  Right ulnar pulse is palpable.  Motor and sensory are in tact right hand.    CBC    Component Value Date/Time   WBC 14.2 (H) 10/29/2023 0321   RBC 3.85 (L) 10/29/2023 0321   HGB 12.0 (L) 10/29/2023 0321   HCT 36.3 (L) 10/29/2023 0321   PLT 198 10/29/2023 0321   MCV 94.3 10/29/2023 0321   MCH 31.2 10/29/2023 0321   MCHC 33.1 10/29/2023 0321   RDW 13.4 10/29/2023 0321   LYMPHSABS 2.4 01/19/2023 1103   MONOABS 1.1 (H) 01/19/2023 1103   EOSABS 0.3 01/19/2023 1103   BASOSABS 0.1 01/19/2023 1103    BMET    Component Value Date/Time   NA 139 10/29/2023 0321   K 3.9 10/29/2023 0321   CL 106 10/29/2023 0321   CO2 24 10/29/2023 0321   GLUCOSE 109 (H) 10/29/2023 0321   BUN 12 10/29/2023 0321   CREATININE 0.90 10/29/2023 0321   CREATININE 0.92 02/06/2020 1404   CALCIUM  8.4 (L) 10/29/2023 0321   GFRNONAA >60 10/29/2023 0321   GFRAA >60 01/30/2017 0627    INR    Component Value Date/Time   INR 0.9 10/28/2023 0331     Intake/Output Summary (Last 24 hours) at 10/29/2023 0648 Last data filed at 10/28/2023 2000 Gross per 24 hour  Intake 323 ml  Output 350 ml  Net -27 ml     Assessment/Plan:  73 y.o. male  with right antecubital swelling and pain after discontinuation of an IV.  The patient did have ipsilateral right radial artery access for coronary artery intervention for STEMI on 10/28/2023   Hospital Day 1  -continue to elevate arm.  I added another pillow to get his arm a  little higher.  May benefit from sling for when he is discharged.  Discussed if he does get a sling, he could wear it when he is out and about but when he is resting, not to wear it and elevate his arm.  He has palpable right ulnar pulse.  Motor and sensory in right hand are in tact and his arm is soft.     Lucie Apt, PA-C [facility] [phone] 10/29/2023 6:48 AM

## 2023-10-29 NOTE — Discharge Summary (Addendum)
 Discharge Summary   Patient ID: Martin Navarro MRN: [id number]; DOB: 29-Sep-1950  Admit date: 10/28/2023 Discharge date: 10/29/2023  PCP:  [name], MD   [facility] Providers Cardiologist:  Powell FORBES Sorrow, MD (Inactive)      Discharge Diagnoses  Principal Problem:   Acute ST elevation myocardial infarction (STEMI) of inferior wall [facility]) Active Problems:   STEMI involving right coronary artery [facility])   Diagnostic Studies/Procedures   LHC 10/28/23    Prox RCA lesion is 99% stenosed.   Mid LAD lesion is 30% stenosed.   A drug-eluting stent was successfully placed using a STENT SYNERGY XD 3.0X12.   Post intervention, there is a 0% residual stenosis.   Acute inferior STEMI secondary to severe stenosis of the proximal RCA Successful PTCA/DES x proximal RCA Mild non-obstructive disease in the mid LAD LVEDP 14 mm Hg   Recommendations: Admit to the ICU. Aggrastat  infusion for 2 hours. DAPT with ASA and Brilinta  for one year. High intensity statin. Echo later today. Fast track discharge.   Diagnostic Dominance: Right  Intervention    Echocardiogram 10/28/23   1. Left ventricular ejection fraction, by estimation, is 55 to 60%. The  left ventricle has normal function. The left ventricle demonstrates  regional wall motion abnormalities (see scoring diagram/findings for  description). Left ventricular diastolic  function could not be evaluated. There is mild hypokinesis of the left  ventricular, basal-mid inferior wall and inferolateral wall.   2. Right ventricular systolic function is normal. The right ventricular  size is normal.   3. The mitral valve is normal in structure. No evidence of mitral valve  regurgitation. No evidence of mitral stenosis.   4. The aortic valve was not well visualized. Aortic valve regurgitation  is not visualized. Aortic valve sclerosis/calcification is present,  without any evidence of aortic stenosis.   5. The inferior vena  cava is normal in size with greater than 50%  respiratory variability, suggesting right atrial pressure of 3 mmHg.  _____________   History of Present Illness   Martin Navarro is a 73 y.o. male with a past medical history of COPD, tobacco use, HTN, HLD, and CAD (coronary atherosclerosis noted on chest CT. Patient presented to [facility] ED on 8/3 complaining of chest pain that woke him up from sleep at 1 AM. EKG showed interior and anterolateral ST elevation. Code STEMI was activated and patient was transferred to [facility] for cardiac catheterization. He was chest pain free by the time he arrived at [facility]. Underwent emergent PCI    Hospital Course    STEMI - Patient presented to the [facility] ED on 8/3 with chest pain that woke him up from sleep. EKG showed inferior and anterolateral ST elevation. Troponin peaked at 6804. Was transferred to [facility] and underwent emergent cath - Cath found 99% stenosis in the proximal RCA. This was treated with successful PTCA/DES x1. There was mild, non-obstructive disease in the mid LAD  - After cath, patient was admitted to the ICU. Treated with aggrastat  infusion for 2 hours  - Echocardiogram showed EF 55-60% with regional wall motion abnormalities, normal RV systolic function, no significant valvular abnormalities  - Continue DAPT with ASA, brilinta  for 1 year  - Continue crestor  40 mg daily - Patient was seen and examined by Dr. Wonda and was deemed stable for discharge. Arranged outpatient follow up with general cardiology 8/13  - Seen by cardiac rehab prior to DC and tolerated ambulation. Radial site care instructions provided on  AVS  HTN - BP a bit soft post cath, home amlodipine  held  - Continue losartan  50 mg daily   HLD  - Lipid panel this admission showed LDL 63, HDL 44, triglycerides 180, total cholesterol 143  - Continue crestor  40 mg daily (increased from 10 mg this admission)  - Repeat Lipids and LFTs in 8 weeks   Tobacco Abuse  - Patient was  counseled on tobacco cessation   Right arm hematoma  - Developed post cath. Vascular surgery consulted, CT angiogram of the right upper extremity showed no evidence of vascular injury  - Patient managed conservatively with arm elevation, symptoms improved prior to DC       Did the patient have an acute coronary syndrome (MI, NSTEMI, STEMI, etc) this admission?:  Yes                               AHA/ACC ACS Clinical Performance & Quality Measures: Aspirin  prescribed? - Yes ADP Receptor Inhibitor (Plavix/Clopidogrel, Brilinta /Ticagrelor  or Effient/Prasugrel) prescribed (includes medically managed patients)? - Yes Beta Blocker prescribed? - No - hypotension  High Intensity Statin (Lipitor 40-80mg  or Crestor  20-40mg ) prescribed? - Yes EF assessed during THIS hospitalization? - Yes For EF <40%, was ACEI/ARB prescribed? - Not Applicable (EF >/= 40%) For EF <40%, Aldosterone Antagonist (Spironolactone or Eplerenone) prescribed? - Not Applicable (EF >/= 40%) Cardiac Rehab Phase II ordered (including medically managed patients)? - Yes     _____________  Discharge Vitals Blood pressure 99/61, pulse 67, temperature (!) 97.3 F (36.3 C), temperature source Axillary, resp. rate 17, height 5' 3 (1.6 m), weight 79.6 kg, SpO2 93%.  Filed Weights   10/28/23 0329 10/28/23 0352 10/28/23 0543  Weight: 81 kg 81 kg 79.6 kg    Labs & Radiologic Studies  CBC Recent Labs    10/28/23 0332 10/29/23 0321  WBC 12.2* 14.2*  HGB 14.8 12.0*  HCT 44.0 36.3*  MCV 94.2 94.3  PLT 221 198   Basic Metabolic Panel Recent Labs    91/96/74 0332 10/29/23 0321  NA 139 139  K 4.1 3.9  CL 102 106  CO2 25 24  GLUCOSE 138* 109*  BUN 13 12  CREATININE 0.99 0.90  CALCIUM  9.1 8.4*   Liver Function Tests No results for input(s): AST, ALT, ALKPHOS, BILITOT, PROT, ALBUMIN in the last 72 hours. No results for input(s): LIPASE, AMYLASE in the last 72 hours. High Sensitivity Troponin:   Recent  Labs  Lab 10/28/23 1719 10/28/23 1850  TROPONINIHS 6,804* 6,555*    Recent Labs  Lab 10/28/23 0332  TRNPT 208*    BNP Invalid input(s): POCBNP No results for input(s): PROBNP in the last 72 hours.  No results for input(s): BNP in the last 72 hours.  D-Dimer No results for input(s): DDIMER in the last 72 hours. Hemoglobin A1C Recent Labs    10/28/23 0332  HGBA1C 6.1*   Fasting Lipid Panel Recent Labs    10/28/23 0332  CHOL 143  HDL 44  LDLCALC 63  TRIG 180*  CHOLHDL 3.3   No results found for: LIPOA  Thyroid  Function Tests No results for input(s): TSH, T4TOTAL, T3FREE, THYROIDAB in the last 72 hours.  Invalid input(s): FREET3 _____________  ECHOCARDIOGRAM COMPLETE Result Date: 10/28/2023    ECHOCARDIOGRAM REPORT   Patient Name:   Martin Navarro Date of Exam: 10/28/2023 Medical Rec #:  [id number]      Height:  63.0 in Accession #:    [id number]     Weight:       175.5 lb Date of Birth:  01-Sep-1950      BSA:          1.829 m Patient Age:    73 years       BP:           103/72 mmHg Patient Gender: M              HR:           59 bpm. Exam Location:  Inpatient Procedure: 2D Echo, Cardiac Doppler, Color Doppler and Intracardiac            Opacification Agent (Both Spectral and Color Flow Doppler were            utilized during procedure). Indications:    CAD Native Vessel i25.10  History:        Patient has prior history of Echocardiogram examinations, most                 recent 11/15/2021. CAD, COPD; Risk Factors:Dyslipidemia and                 Hypertension.  Sonographer:    Damien Senior RDCS Referring Phys: [id number] CHRISTOPHER D [facility]  Sonographer Comments: Very difficult study due to poor echo windows and respiratory variability. IMPRESSIONS  1. Left ventricular ejection fraction, by estimation, is 55 to 60%. The left ventricle has normal function. The left ventricle demonstrates regional wall motion abnormalities (see scoring diagram/findings for  description). Left ventricular diastolic function could not be evaluated. There is mild hypokinesis of the left ventricular, basal-mid inferior wall and inferolateral wall.  2. Right ventricular systolic function is normal. The right ventricular size is normal.  3. The mitral valve is normal in structure. No evidence of mitral valve regurgitation. No evidence of mitral stenosis.  4. The aortic valve was not well visualized. Aortic valve regurgitation is not visualized. Aortic valve sclerosis/calcification is present, without any evidence of aortic stenosis.  5. The inferior vena cava is normal in size with greater than 50% respiratory variability, suggesting right atrial pressure of 3 mmHg. FINDINGS  Left Ventricle: Left ventricular ejection fraction, by estimation, is 55 to 60%. The left ventricle has normal function. The left ventricle demonstrates regional wall motion abnormalities. Mild hypokinesis of the left ventricular, basal-mid inferior wall and inferolateral wall. Definity  contrast agent was given IV to delineate the left ventricular endocardial borders. The left ventricular internal cavity size was normal in size. There is no left ventricular hypertrophy. Left ventricular diastolic function could not be evaluated. Right Ventricle: The right ventricular size is normal. No increase in right ventricular wall thickness. Right ventricular systolic function is normal. Left Atrium: Left atrial size was normal in size. Right Atrium: Right atrial size was normal in size. Pericardium: There is no evidence of pericardial effusion. Mitral Valve: The mitral valve is normal in structure. No evidence of mitral valve regurgitation. No evidence of mitral valve stenosis. Tricuspid Valve: The tricuspid valve is normal in structure. Tricuspid valve regurgitation is not demonstrated. No evidence of tricuspid stenosis. Aortic Valve: The aortic valve was not well visualized. Aortic valve regurgitation is not visualized. Aortic  valve sclerosis/calcification is present, without any evidence of aortic stenosis. Pulmonic Valve: The pulmonic valve was normal in structure. Pulmonic valve regurgitation is not visualized. No evidence of pulmonic stenosis. Aorta: The aortic root is normal in size and structure. Venous: The  inferior vena cava is normal in size with greater than 50% respiratory variability, suggesting right atrial pressure of 3 mmHg. IAS/Shunts: No atrial level shunt detected by color flow Doppler.   Diastology LV e' medial:    6.31 cm/s LV E/e' medial:  9.5 LV e' lateral:   7.62 cm/s LV E/e' lateral: 7.8  RIGHT VENTRICLE RV S prime:     8.38 cm/s TAPSE (M-mode): 2.0 cm LEFT ATRIUM             Index        RIGHT ATRIUM           Index LA Vol (A2C):   42.8 ml 23.40 ml/m  RA Area:     17.80 cm LA Vol (A4C):   41.9 ml 22.91 ml/m  RA Volume:   46.90 ml  25.64 ml/m LA Biplane Vol: 45.8 ml 25.04 ml/m  AORTIC VALVE LVOT Vmax:   92.60 cm/s LVOT Vmean:  62.100 cm/s LVOT VTI:    0.187 m MITRAL VALVE MV Area (PHT): 3.19 cm    SHUNTS MV Decel Time: 238 msec    Systemic VTI: 0.19 m MV E velocity: 59.70 cm/s MV A velocity: 76.50 cm/s MV E/A ratio:  0.78 Wilbert Bihari MD Electronically signed by Wilbert Bihari MD Signature Date/Time: 10/28/2023/11:31:33 AM    Final    CT ANGIO UP EXTREM RIGHT W &/OR WO CONTRAST Result Date: 10/28/2023 EXAM: CTA RIGHT UPPER EXTREMITY 10/28/2023 10:12:43 AM TECHNIQUE: Contrast-enhanced computed tomography angiography of the upper extremity was performed with multiplanar reconstructions. Automated exposure control, iterative reconstruction, and/or weight based adjustment of the mA/kV was utilized to reduce the radiation dose to as low as reasonably achievable. COMPARISON: None available. CLINICAL HISTORY: Forearm trauma; ? brachial sheath hematoma. infiltration after IV. Concern for arterial cannulation. Unable to assess brachial artery on bedside ultrasound. FINDINGS: ARTERIAL: Mild soft tissue stranding is  identified surrounding the right subclavian and proximal right brachial artery. Patent right subclavian artery. The brachial artery is patent to the level of the bifurcation. Patency of the radial and ulnar arteries noted. No arterial contrast extravasation to indicate active bleeding. VENOUS: The axillary, brachial, cephalic, basilic and median cubital veins are patent with no thrombosis. BONES AND SOFT TISSUES: No fractures identified. Edema with overlying subcutaneous soft tissue stranding is identified involving the biceps musculature and brachialis musculature without signs of hyperdense hematoma. LUNGS: Emphysematous changes noted within the imaged portions of the lungs. HEART AND MEDIASTINUM: Aortic atherosclerosis with multivessel coronary artery calcification. STOMACH AND BOWEL: Sigmoid diverticulosis. SOFT TISSUES: Fat-containing left inguinal hernia. IMPRESSION: 1. Mild soft tissue stranding surrounding the right subclavian and proximal brachial artery, concerning for evolving brachial sheath hematoma. The right subclavian and brachial arteries remain patent to the level of bifurcation. 2. Radial and ulnar arteries are patent but demonstrate decreased caliber, which may reflect distal vasospasm or evolving compressive effect. Clinical correlation and vascular surgery consultation recommended. 3. Edema with overlying subcutaneous soft tissue stranding involving the biceps and brachialis musculature without signs of hyperdense hematoma or active arterial extravasation. . Electronically signed by: Waddell Calk MD 10/28/2023 10:36 AM EDT RP Workstation: [id number]   CARDIAC CATHETERIZATION Result Date: 10/28/2023   Prox RCA lesion is 99% stenosed.   Mid LAD lesion is 30% stenosed.   A drug-eluting stent was successfully placed using a STENT SYNERGY XD 3.0X12.   Post intervention, there is a 0% residual stenosis. Acute inferior STEMI secondary to severe stenosis of the proximal RCA Successful PTCA/DES x  proximal  RCA Mild non-obstructive disease in the mid LAD LVEDP 14 mm Hg Recommendations: Admit to the ICU. Aggrastat  infusion for 2 hours. DAPT with ASA and Brilinta  for one year. High intensity statin. Echo later today. Fast track discharge.   DG Chest Port 1 View Result Date: 10/28/2023 EXAM: 1 VIEW XRAY OF THE CHEST 10/28/2023 03:49:00 AM COMPARISON: None available. CLINICAL HISTORY: 213373 STEMI (ST elevation myocardial infarction) (HCC) 786626. STEMI STEMI (ST elevation myocardial infarction) (HCC) 786626. STEMI FINDINGS: LUNGS AND PLEURA: No focal pulmonary opacity. No pulmonary edema. No pleural effusion. No pneumothorax. HEART AND MEDIASTINUM: No acute abnormality of the cardiac and mediastinal silhouettes. BONES AND SOFT TISSUES: No acute osseous abnormality. IMPRESSION: 1. No acute process. Electronically signed by: Norman Gatlin MD 10/28/2023 03:56 AM EDT RP Workstation: [id number]    Disposition Pt is being discharged home today in good condition.  Follow-up Plans & Appointments  Discharge Instructions     Amb Referral to Cardiac Rehabilitation   Complete by: As directed    Diagnosis:  Coronary Stents STEMI PTCA     After initial evaluation and assessments completed: Virtual Based Care may be provided alone or in conjunction with Phase 2 Cardiac Rehab based on patient barriers.: Yes   Intensive Cardiac Rehabilitation (ICR) [facility] location only OR Traditional Cardiac Rehabilitation (TCR) *If criteria for ICR are not met will enroll in TCR [facility] only): Yes   Diet - low sodium heart healthy   Complete by: As directed    Discharge instructions   Complete by: As directed    Radial Site Care Refer to this sheet in the next few weeks. These instructions provide you with information on caring for yourself after your procedure. Your caregiver may also give you more specific instructions. Your treatment has been planned according to current medical practices, but problems sometimes occur.  Call your caregiver if you have any problems or questions after your procedure. HOME CARE INSTRUCTIONS You may shower the day after the procedure. Remove the bandage (dressing) and gently wash the site with plain soap and water. Gently pat the site dry.  Do not apply powder or lotion to the site.  Do not submerge the affected site in water for 3 to 5 days.  Inspect the site at least twice daily.  Do not flex or bend the affected arm for 24 hours.  No lifting over 5 pounds (2.3 kg) for 5 days after your procedure.  Do not drive home if you are discharged the same day of the procedure. Have someone else drive you.  You may drive 24 hours after the procedure unless otherwise instructed by your caregiver.  What to expect: Any bruising will usually fade within 1 to 2 weeks.  Blood that collects in the tissue (hematoma) may be painful to the touch. It should usually decrease in size and tenderness within 1 to 2 weeks.  SEEK IMMEDIATE MEDICAL CARE IF: You have unusual pain at the radial site.  You have redness, warmth, swelling, or pain at the radial site.  You have drainage (other than a small amount of blood on the dressing).  You have chills.  You have a fever or persistent symptoms for more than 72 hours.  You have a fever and your symptoms suddenly get worse.  Your arm becomes pale, cool, tingly, or numb.  You have heavy bleeding from the site. Hold pressure on the site.    PLEASE DO NOT MISS ANY DOSES OF YOUR BRILINTA !!!!! Also keep a log of you  blood pressures and bring back to your follow up appt. Please call the office with any questions.   Patients taking blood thinners should generally stay away from medicines like ibuprofen, Advil, Motrin, naproxen , and Aleve  due to risk of stomach bleeding. You may take Tylenol  as directed or talk to your primary doctor about alternatives.  PLEASE ENSURE THAT YOU DO NOT RUN OUT OF YOUR BRILINTA . This medication is very important to remain on for at  least one year. IF you have issues obtaining this medication due to cost please CALL the office 3-5 business days prior to running out in order to prevent missing doses of this medication.   Increase activity slowly   Complete by: As directed        Discharge Medications Allergies as of 10/29/2023       Reactions   Sulfa Antibiotics Other (See Comments)   Unknown reaction        Medication List     STOP taking these medications    amLODipine  5 MG tablet Commonly known as: NORVASC        TAKE these medications    Anoro Ellipta  62.5-25 MCG/ACT Aepb Generic drug: umeclidinium-vilanterol USE 1 INHALATION DAILY   aspirin  81 MG tablet Take 81 mg by mouth at bedtime.   FISH OIL PO Take 1 capsule by mouth at bedtime.   losartan  50 MG tablet Commonly known as: COZAAR  TAKE ONE (1) TABLET BY MOUTH EVERY DAY What changed:  how much to take how to take this when to take this   nitroGLYCERIN  0.4 MG SL tablet Commonly known as: Nitrostat  Place 1 tablet (0.4 mg total) under the tongue every 5 (five) minutes as needed for chest pain.   omeprazole  40 MG capsule Commonly known as: PRILOSEC Take 1 capsule (40 mg total) by mouth daily. What changed: when to take this   rosuvastatin  40 MG tablet Commonly known as: CRESTOR  Take 1 tablet (40 mg total) by mouth at bedtime. Start taking on: October 30, 2023 What changed:  medication strength how much to take how to take this when to take this additional instructions   ticagrelor  90 MG Tabs tablet Commonly known as: BRILINTA  Take 1 tablet (90 mg total) by mouth 2 (two) times daily.   VITAMIN B-12 PO Take 1 tablet by mouth at bedtime.   VITAMIN E  PO Take 1 capsule by mouth at bedtime.         Outstanding Labs/Studies  Duration of Discharge Encounter: APP Time: 20 minutes  Signed, Rollo FABIENE Louder, PA-C 10/29/2023, 11:59 AM  Patient seen, examined. Available data reviewed. Agree with findings, assessment, and  plan as outlined by Rollo Louder, PA-C.  The patient is independently interviewed and examined in the CV-ICU this morning.  Please see my full progress note for details.  The patient and his daughter are counseled regarding discharge management.  On my exam, he is alert, oriented, in no distress.  JVP is normal, heart is regular rate and rhythm no murmur gallop, lungs are clear bilaterally, abdomen is soft and nontender, lower extremities have no edema.  There is a right arm hematoma with swelling over the forearm and elbow area.  I reviewed the notes of vascular surgery who have recommended ongoing observation as his right arm swelling and hematoma are improving.  Arm elevation and limited mobility are recommended.  From a cardiac perspective, the patient is stable after his acute MI.  LVEF is 55 to 60%.  He will continue aspirin  and ticagrelor   for 12 months.  He will be treated with high intensity statin drug.  We are holding amlodipine  for soft blood pressure and he will continue on losartan .  Outpatient follow-up is arranged.  MD time conducting this evaluation is equal to 30 minutes and includes my personal exam of the patient, counseling the patient and his family regarding his acute cardiac event and recommendations for post MI restrictions and follow-up.  Otherwise, as outlined above.  Ozell Fell, M.D. 10/29/2023 4:09 PM

## 2023-10-29 NOTE — Progress Notes (Signed)
  Progress Note  Patient Name: Martin Navarro Date of Encounter: 10/29/2023 [facility] Cardiologist: Powell FORBES Sorrow, MD (Inactive)   Interval Summary   Doing well.  No chest pain or shortness of breath.  Eager to go home.  Vital Signs Vitals:   10/29/23 0600 10/29/23 0700 10/29/23 0742 10/29/23 0800  BP:   102/60   Pulse: (!) 58 67  67  Resp: 14 18  17   Temp:  98 F (36.7 C)    TempSrc:  Oral    SpO2: 91% 93%  93%  Weight:      Height:        Intake/Output Summary (Last 24 hours) at 10/29/2023 1003 Last data filed at 10/29/2023 0800 Gross per 24 hour  Intake 522 ml  Output 350 ml  Net 172 ml      10/28/2023    5:43 AM 10/28/2023    3:52 AM 10/28/2023    3:29 AM  Last 3 Weights  Weight (lbs) 175 lb 7.8 oz 178 lb 9.2 oz 178 lb 9.2 oz  Weight (kg) 79.6 kg 81 kg 81 kg      Telemetry/ECG  Sinus rhythm with occasional PVC- Personally Reviewed  Physical Exam  GEN: No acute distress.   Neck: No JVD Cardiac: RRR, no murmurs, rubs, or gallops.  Respiratory: Clear to auscultation bilaterally. GI: Soft, nontender, non-distended  MS: Right arm hematoma and edema noted  Assessment & Plan  1.  Acute inferior STEMI.  Patient now greater than 24 hours out from emergency PCI.  Noted to have single-vessel CAD with subtotal occlusion of the right coronary artery.  LVEDP was normal.  Plan to continue aspirin  and ticagrelor .  High intensity statin with rosuvastatin . 2.  Hypertension.  Blood pressure running soft.  LVEF noted to be 55 to 60% with inferior wall hypokinesis by echo assessment.  Recommend hold amlodipine .  Continue losartan  at current dose.  Reassess at outpatient follow-up. 3.  Mixed hyperlipidemia: Patient now on rosuvastatin  40 mg.  LDL cholesterol is 63. 4.  Tobacco abuse: Cessation counseling done. 5.  Right arm hematoma: Appreciate vascular surgery evaluation.  Symptoms improved with conservative management.  Arm elevation recommended.  Motor and sensory  intact.  No further treatment needed. 6.  Disposition: Appears stable for home today.  Ready for discharge.  Daughter at bedside.  Reviewed discharge instructions with them.     For questions or updates, please contact [facility] Please consult [email or web address] for contact info under       Signed, Ozell Fell, MD

## 2023-10-29 NOTE — Plan of Care (Signed)
 Patient ready for discharge. All questions answered and support given after discharge instructions given. Cardiac rehab came and spoke with patient and education done.

## 2023-10-29 NOTE — Telephone Encounter (Signed)
 Patient Product/process development scientist completed.    The patient is insured through [facility]. Patient has Medicare and is not eligible for a copay card, but may be able to apply for patient assistance or Medicare RX Payment Plan (Patient Must reach out to their plan, if eligible for payment plan), if available.    Ran test claim for Brilinta 90 mg and the current 30 day co-pay is $47.00.   This test claim was processed through [facility]- copay amounts may vary at other pharmacies due to pharmacy/plan contracts, or as the patient moves through the different stages of their insurance plan.     Roland Earl, CPHT Pharmacy Technician III Certified Patient Advocate [facility] Patient Advocate Team Direct Number: [phone]  Fax: [phone]

## 2023-10-29 NOTE — Progress Notes (Signed)
 Cardiology Office Note:    Date:  11/07/2023   ID:  Ubaldo JONELLE Finder, DOB 07/24/1950, MRN [id number]  PCP:  [name], MD  Cardiologist:  [name], MD     Referring MD: [name], MD   Chief Complaint: hospital follow-up of STEMI  History of Present Illness:    ARK AGRUSA is a 73 y.o. male with a history of CAD with recent STEMI on 10/28/2023 s/p DES to proximal RCA, COPD, hypertension, hyperlipidemia, GERD, hypogonadism, and tobacco abuse presents today for hospital follow-up of STEMI.  Patient has a history of atypical chest pain and has had multiple nuclear stress tests over the years. He was recently admitted from 10/28/2023 to 10/29/2023 for an acute inferior STEMI after presenting with chest pain that woke him up from sleep. High-sensitivity troponin peaked at 6,804. Emergent cardiac catheterization showed 99% stenosis of proximal RCA and 30% stenosis of mid LAD. He underwent successful PTCA/ DES to the proximal RCA. Echo showed LVEF of 55-60% with mild hypokinesis of the basal-mid inferior wall and inferolateral wall, normal RV function, and no significant valvular disease. He was started on DAPT with Aspirin  and Brilinta  and home Crestor  was increased. Home Amlodipine  was held due to soft BP. He did develop a significant right arm hematoma after cath. Vascular Surgery was consulted. CTA of the right upper extremity showed no evidence of vascular injury.   Patient presents today for follow-up.  Here with his daughter.  He has some mild since discharge.  He reports a couple of episodes of very mild aching sensation in his chest that only lasts for about 10 to 15 seconds.  For example, he had this last night when bending over to pick up something off the floor.  However, no other chest pain.  No exertional chest pain.  He states this is nothing like what he was having prior to his STEMI.  This does not sound like angina.  He has some chronic dyspnea on exertion with  more strenuous activity but this is not new and is stable.  No shortness of breath with routine activities.  No orthopnea, PND, edema.  No palpitations, lightheadedness, dizziness, syncope.  He still has some ecchymosis on right arm from hematoma but this is improving. Arm is soft and cath site stable. He is compliant with his medications including DAPT and is tolerating them well.    He has been monitoring his BP at home.  Reviewed BP log.  BP is well-controlled in the morning but typically mildly elevated at the end of the day with average BP above goal of 130/80.  He states he usually takes his Losartan  at night around the time he checks his BP.    EKGs/Labs/Other Studies Reviewed:    The following studies were reviewed:  Cardiac Catheterization 10/28/2023:   Prox RCA lesion is 99% stenosed.   Mid LAD lesion is 30% stenosed.   A drug-eluting stent was successfully placed using a STENT SYNERGY XD 3.0X12.   Post intervention, there is a 0% residual stenosis.   Acute inferior STEMI secondary to severe stenosis of the proximal RCA Successful PTCA/DES x proximal RCA Mild non-obstructive disease in the mid LAD LVEDP 14 mm Hg   Recommendations: Admit to the ICU. Aggrastat  infusion for 2 hours. DAPT with ASA and Brilinta  for one year. High intensity statin. Echo later today. Fast track discharge.   Diagnostic Dominance: Right  Intervention     _______________  Echocardiogram 10/28/2023: Impressions: 1.  Left ventricular ejection fraction, by estimation, is 55 to 60%. The  left ventricle has normal function. The left ventricle demonstrates  regional wall motion abnormalities (see scoring diagram/findings for  description). Left ventricular diastolic  function could not be evaluated. There is mild hypokinesis of the left  ventricular, basal-mid inferior wall and inferolateral wall.   2. Right ventricular systolic function is normal. The right ventricular  size is normal.   3. The mitral  valve is normal in structure. No evidence of mitral valve  regurgitation. No evidence of mitral stenosis.   4. The aortic valve was not well visualized. Aortic valve regurgitation  is not visualized. Aortic valve sclerosis/calcification is present,  without any evidence of aortic stenosis.   5. The inferior vena cava is normal in size with greater than 50%  respiratory variability, suggesting right atrial pressure of 3 mmHg.   EKG:  EKG ordered today.   EKG Interpretation Date/Time:  Wednesday November 07 2023 14:56:58 EDT Ventricular Rate:  65 PR Interval:  200 QRS Duration:  78 QT Interval:  372 QTC Calculation: 386 R Axis:   -14  Text Interpretation: Normal sinus rhythm Left ventricular hypertrophy Q waves and T wave inversions in inferior leads Mild T wave inversions in leads V5-V6 Inferior infarct (cited on or before 29-Oct-2023) When compared with ECG of 29-Oct-2023 07:09, No significant change was found Confirmed by Jadine Patient [phone]) on 11/07/2023 3:09:37 PM    Recent Labs: 01/19/2023: ALT 13 10/29/2023: BUN 12; Creatinine, Ser 0.90; Hemoglobin 12.0; Platelets 198; Potassium 3.9; Sodium 139  Recent Lipid Panel    Component Value Date/Time   CHOL 143 10/28/2023 0332   TRIG 180 (H) 10/28/2023 0332   HDL 44 10/28/2023 0332   CHOLHDL 3.3 10/28/2023 0332   VLDL 36 10/28/2023 0332   LDLCALC 63 10/28/2023 0332   LDLCALC 74 12/24/2019 0941   LDLDIRECT 76.0 01/03/2021 0921    Physical Exam:    Vital Signs: BP 120/60   Pulse 65   Ht 5' 3 (1.6 m)   Wt 175 lb 12.8 oz (79.7 kg)   SpO2 99%   BMI 31.14 kg/m     Wt Readings from Last 3 Encounters:  11/07/23 175 lb 12.8 oz (79.7 kg)  11/06/23 174 lb 4.8 oz (79.1 kg)  10/28/23 175 lb 7.8 oz (79.6 kg)     General: 73 y.o. male in no acute distress. Neck: Supple. No carotid bruits. No JVD. Heart: RRR. Distinct S1 and S2. No murmurs, gallops, or rubs.  Lungs: No increased work of breathing. Clear to ausculation  bilaterally. No wheezes, rhonchi, or rales.  Extremities: No lower extremity edema. Resolving ecchymosis on right arm from hematoma following cardiac catheterization. Right radial cath site soft with no drainage. Skin: Warm and dry. Neuro: No focal deficits. Psych: Normal affect. Responds appropriately.   Assessment:    1. Coronary artery disease involving native coronary artery of native heart without angina pectoris   2. History of ST elevation myocardial infarction (STEMI)   3. Hematoma of arm, right, subsequent encounter   4. Primary hypertension   5. Hyperlipidemia, unspecified hyperlipidemia type   6. Tobacco abuse     Plan:    CAD with Recent STEMI Recently admitted with acute inferior STEMI earlier this month. S/p DES to proximal RCA.  - Overall doing well. He describes a couple of episodes of atypical vague chest discomfort but nothing that sounds like angina. - Continue DAPT with Aspirin  81mg  daily and Brilinta  90mg  twice  daily. - Continue Crestor  40mg  daily.  - Recommended increasing physical activity as tolerated. OK to start Cardiac Rehab.  He may return to work on 11/12/2023. - Will check BMET and CBC today as he has not had any repeat labs since recent hospitalization.   Right Arm Hematoma Recent cardiac catheterization complicated by significant right arm hematoma. Vascular Surgery was consulted. CTA of right upper extremity was ordered and showed no evidence of vascular injury.  - Still has some ecchymosis of right arm but improving. Arm is soft. Good radial pulse.   Hypertension BP well controlled controlled in the office but BP mildly elevated at night based on home BP log. - Continue Losartan  50mg  daily.  - Previously on Amlodipine  5mg  daily but this was held on recent discharge due to soft BP. I think we can restart this.  - Recommended patient continue to monitor BP at home and let us  know if BP drops too low (systolic BP <100) after restarting Amlodipine  or if  BP remains consistently >130/80.   Hyperlipidemia Lipid panel on 10/28/2023 during recent admission: Total Cholesterol 143, Triglycerides 189, HDL 33, LDL 63. LDL goal <70 given CAD.  - Crestor  was increased to 40mg  daily during recent admission. Continue.  - Patient is planning on having repeat labs drawn a PCP's office in 12/2023.  Tobacco Abuse He has not smoked since the time of his MI.  - Congratulated patient on this and re-emphasized the importance of complete cessation.  Disposition: Follow up in 3 months.    Signed, Aline FORBES Door, PA-C  11/07/2023 7:28 PM    [facility]

## 2023-10-29 NOTE — TOC Initial Note (Signed)
 Transition of Care [facility]) - Initial/Assessment Note    Patient Details  Name: Martin Navarro MRN: [id number] Date of Birth: 1950/05/13  Transition of Care [facility] CM/SW Contact:    Martin Delcia Czar, RN Phone Number: [phone] 10/29/2023, 12:34 PM  Clinical Narrative:                  Spoke to pt and dtr at bedside. Pt states he is independent. Dtr states she prefers to schedule appt with PCP, Martin Navarro.  Faxed dc summary to office.  Dtr will provide transportation to home. [name] is in home to assist him as needed.  Does not have hx of HH/DME, able to drive to appts.    Barriers to Discharge: No Barriers Identified   Patient Goals and CMS Choice Patient states their goals for this hospitalization and ongoing recovery are:: wants to remain independent          Expected Discharge Plan and Services   Discharge Planning Services: CM Consult   Living arrangements for the past 2 months: Single Family Home Expected Discharge Date: 10/29/23                                    Prior Living Arrangements/Services Living arrangements for the past 2 months: Single Family Home Lives with:: Significant Other Patient language and need for interpreter reviewed:: Yes Do you feel safe going back to the place where you live?: Yes      Need for Family Participation in Patient Care: No (Comment) Care giver support system in place?: No (comment)   Criminal Activity/Legal Involvement Pertinent to Current Situation/Hospitalization: No - Comment as needed  Activities of Daily Living   ADL Screening (condition at time of admission) Is the patient deaf or have difficulty hearing?: No Does the patient have difficulty seeing, even when wearing glasses/contacts?: No Does the patient have difficulty concentrating, remembering, or making decisions?: No  Permission Sought/Granted Permission sought to share information with : Case Manager, Family Supports, PCP Permission granted to  share information with : Yes, Verbal Permission Granted  Share Information with NAME: Martin Navarro  Permission granted to share info w AGENCY: PCP, DME  Permission granted to share info w Relationship: daughter  Permission granted to share info w Contact Information: [phone]  Emotional Assessment Appearance:: Appears stated age Attitude/Demeanor/Rapport: Engaged Affect (typically observed): Appropriate Orientation: : Oriented to Self, Oriented to Place, Oriented to  Time, Oriented to Situation   Psych Involvement: No (comment)  Admission diagnosis:  STEMI involving right coronary artery (HCC) [I21.11] Acute ST elevation myocardial infarction (STEMI) of inferior wall (HCC) [I21.19] Patient Active Problem List   Diagnosis Date Noted   STEMI involving right coronary artery (HCC) 10/29/2023   Acute ST elevation myocardial infarction (STEMI) of inferior wall (HCC) 10/28/2023   Hyperglycemia 04/28/2022   Aortic atherosclerosis (HCC) 01/03/2021   Acute hypoxemic respiratory failure due to COVID-19 (HCC) 04/24/2020   COPD (chronic obstructive pulmonary disease) (HCC) 04/24/2020   Hypertension 02/06/2020   Pain in left knee 05/13/2019   Epididymoorchitis 01/28/2017   Hypokalemia    AKI (acute kidney injury) (HCC)    Nausea    Acute prostatitis 09/21/2016   Sepsis (HCC) 09/21/2016   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   Erectile dysfunction 10/25/2010   GERD (gastroesophageal reflux disease) 10/25/2010   Hyperlipidemia 10/25/2010   Nicotine abuse 10/25/2010   PCP:  Martin Navarro ORN, MD  Pharmacy:   [facility], [place] - [address] [place] [address] Phone: [phone] Fax: [phone]  [facility] [address] [place] [address] Phone: [phone] Fax: [phone]     Social Drivers of Health (SDOH) Social History: SDOH Screenings   Food Insecurity: No Food Insecurity (12/15/2021)  Housing:  Low Risk  (10/29/2023)  Transportation Needs: No Transportation Needs (12/15/2021)  Utilities: Not At Risk (10/29/2023)  Depression (PHQ2-9): Low Risk  (01/19/2023)  Tobacco Use: High Risk (10/28/2023)  Health Literacy: Low Risk  (04/14/2020)   Received from [facility]   SDOH Interventions:     Readmission Risk Interventions     No data to display

## 2023-10-29 NOTE — Discharge Instructions (Signed)
Information about your medication: Brilinta (anti-platelet agent)  Generic Name (Brand): ticagrelor (Brilinta), twice daily medication  PURPOSE: You are taking this medication along with aspirin to lower your chance of having a heart attack, stroke, or blood clots in your heart stent. These can be fatal. Brilinta and aspirin help prevent platelets from sticking together and forming a clot that can block an artery or your stent.   Common SIDE EFFECTS you may experience include: bruising or bleeding more easily, shortness of breath  Do not stop taking BRILINTA without talking to the doctor who prescribes it for you. People who are treated with a stent and stop taking Brilinta too soon, have a higher risk of getting a blood clot in the stent, having a heart attack, or dying. If you stop Brilinta because of bleeding, or for other reasons, your risk of a heart attack or stroke may increase.   Tell all of your doctors and dentists that you are taking Brilinta. They should talk to the doctor who prescribed Brilinta for you before you have any surgery or invasive procedure.   Contact your health care provider if you experience: severe or uncontrollable bleeding, pink/red/brown urine, vomiting blood or vomit that looks like "coffee grounds", red or black stools (looks like tar), coughing up blood or blood clots ----------------------------------------------------------------------------------------------------------------------

## 2023-10-29 NOTE — Progress Notes (Signed)
 Pt has been ambulating without difficulty. Discussed with pt and daughter MI, stent, restrictions, Brilinta  importance, smoking cessation, diet, exercise, and CRPII. Pt receptive. Sts he hasn't had a cigarette for a week and plans to continue. Will refer to [facility] CRPII.  [phone] Aliene Aris BS, ACSM-CEP 10/29/2023 10:58 AM

## 2023-10-30 ENCOUNTER — Telehealth: Payer: Self-pay | Admitting: *Deleted

## 2023-10-30 NOTE — Transitions of Care (Post Inpatient/ED Visit) (Signed)
 10/30/2023  Name: Martin Navarro MRN: [id number] DOB: [birth year]  Today's TOC FU Call Status: Today's TOC FU Call Status:: Successful TOC FU Call Completed TOC FU Call Complete Date: 10/30/23 Patient's Name and Date of Birth confirmed.  Transition Care Management Follow-up Telephone Call Date of Discharge: 10/29/23 Discharge Facility: [name]) Type of Discharge: Inpatient Admission Primary Inpatient Discharge Diagnosis:: Acute ST elevation myocardial infarction (STEMI) of inferior wall How have you been since you were released from the hospital?: Better Any questions or concerns?: No  Items Reviewed: Did you receive and understand the discharge instructions provided?: Yes Medications obtained,verified, and reconciled?: Yes (Medications Reviewed) Any new allergies since your discharge?: No Dietary orders reviewed?: No Do you have support at home?: Yes People in Home [RPT]: significant other Name of Support/Comfort Primary Source: [name] daughter  Medications Reviewed Today: Medications Reviewed Today     Reviewed by Kennieth Cathlean DEL, RN (Case Manager) on 10/30/23 at 1650  Med List Status: <None>   Medication Order Taking? Sig Documenting Provider Last Dose Status Informant  ANORO ELLIPTA  62.5-25 MCG/ACT AEPB [id number]  USE 1 INHALATION DAILY  Patient not taking: Reported on 10/30/2023   Mannam, Praveen, MD  Active Child, Self, Pharmacy Records  aspirin  81 MG tablet [id number] Yes Take 81 mg by mouth at bedtime. [provider]  Active Child, Self, Pharmacy Records  Cyanocobalamin  (VITAMIN B-12 PO) [id number] Yes Take 1 tablet by mouth at bedtime. [provider]  Active Child, Self, Pharmacy Records  losartan  (COZAAR ) 50 MG tablet [id number] Yes TAKE ONE (1) TABLET BY MOUTH EVERY DAY  Patient taking differently: Take 50 mg by mouth at bedtime. TAKE ONE (1) TABLET BY MOUTH EVERY DAY   Burchette, Wolm ORN, MD  Active Child, Self, Pharmacy Records   nitroGLYCERIN  (NITROSTAT ) 0.4 MG SL tablet [id number] Yes Place 1 tablet (0.4 mg total) under the tongue every 5 (five) minutes as needed for chest pain. Vicci Rollo JONELLE, PA-C  Active   Omega-3 Fatty Acids (FISH OIL PO) [id number] Yes Take 1 capsule by mouth at bedtime. [provider]  Active Child, Self, Pharmacy Records  omeprazole  (PRILOSEC) 40 MG capsule [id number] Yes Take 1 capsule (40 mg total) by mouth daily.  Patient taking differently: Take 40 mg by mouth at bedtime.   [name], MD  Active Child, Self, Pharmacy Records  rosuvastatin  (CRESTOR ) 40 MG tablet [id number] Yes Take 1 tablet (40 mg total) by mouth at bedtime. Vicci Rollo JONELLE, PA-C  Active   ticagrelor  (BRILINTA ) 90 MG TABS tablet [id number] Yes Take 1 tablet (90 mg total) by mouth 2 (two) times daily. Johnson, Kathleen R, PA-C  Active   VITAMIN E  PO [id number] Yes Take 1 capsule by mouth at bedtime. [provider]  Active Child, Self, Pharmacy Records            Home Care and Equipment/Supplies: Were Home Health Services Ordered?: NA Any new equipment or medical supplies ordered?: NA  Functional Questionnaire: Do you need assistance with bathing/showering or dressing?: Yes Do you need assistance with meal preparation?: Yes Do you need assistance with eating?: No Do you have difficulty maintaining continence: No Do you need assistance with getting out of bed/getting out of a chair/moving?: No Do you have difficulty managing or taking your medications?: No  Follow up appointments reviewed: PCP Follow-up appointment confirmed?: No MD Provider Line Number:[phone] Given: Yes (Daughter will make appointment) Specialist Hospital Follow-up appointment confirmed?: Yes Date of Specialist follow-up appointment?: 11/07/23 Follow-Up  Specialty Provider:: Callie Goodrich Do you need transportation to your follow-up appointment?: No Do you understand care options if your condition(s)  worsen?: Yes-patient verbalized understanding  SDOH Interventions Today    Flowsheet Row Most Recent Value  SDOH Interventions   Food Insecurity Interventions Intervention Not Indicated  Housing Interventions Intervention Not Indicated  Transportation Interventions Intervention Not Indicated, Patient Resources (Friends/Family)  Utilities Interventions Intervention Not Indicated   Rn discussed incision care with daughter RN discussed with daughter for patient to not miss a dose of his Brilinta . IF you have issues obtaining this medication due to cost please CALL the office 3-5 business days prior to running out.  The patient has been provided with contact information for the care management team and has been advised to call with any health-related questions or concerns. The patient verbalized understanding with current POC. The patient is directed to their insurance card regarding availability of benefits coverage   Cathlean Headland BSN RN [facility] Health Care Management Coordinator [email or web address] Direct Dial: [phone]  Fax: [phone] Website: [email or web address]

## 2023-10-31 LAB — GLUCOSE, CAPILLARY: Glucose-Capillary: 135 mg/dL — ABNORMAL HIGH (ref 70–99)

## 2023-11-06 ENCOUNTER — Encounter: Payer: Self-pay | Admitting: Family Medicine

## 2023-11-06 ENCOUNTER — Ambulatory Visit: Admitting: Family Medicine

## 2023-11-06 VITALS — BP 124/76 | HR 68 | Temp 97.8°F | Wt 174.3 lb

## 2023-11-06 DIAGNOSIS — I10 Essential (primary) hypertension: Secondary | ICD-10-CM

## 2023-11-06 DIAGNOSIS — Z720 Tobacco use: Secondary | ICD-10-CM | POA: Diagnosis not present

## 2023-11-06 DIAGNOSIS — I2111 ST elevation (STEMI) myocardial infarction involving right coronary artery: Secondary | ICD-10-CM

## 2023-11-06 DIAGNOSIS — R229 Localized swelling, mass and lump, unspecified: Secondary | ICD-10-CM | POA: Diagnosis not present

## 2023-11-06 DIAGNOSIS — E785 Hyperlipidemia, unspecified: Secondary | ICD-10-CM

## 2023-11-06 NOTE — Patient Instructions (Signed)
 Set up future labs for lipid and hepatic in about 2 months  HOLD Meloxicam or other NSAIDS such as Motrin or Aleve  in setting of recent MI  Continue with new dose of Rosuvastatin   Continue to HOLD Amlodipine  unless systolic (top number) consistently > 135-140.    See dermatologist for left thumb lesion- possible squamous cell carcinoma   AVOID any further nicotine use.

## 2023-11-06 NOTE — Progress Notes (Signed)
 Established Patient Office Visit  Subjective   Patient ID: Martin Navarro, male    DOB: 28-Oct-1950  Age: 73 y.o. MRN: [id number]  Chief Complaint  Patient presents with   Hospitalization Follow-up    HPI   Martin Navarro is seen for hospital follow-up following recent ST elevation MI.  He has past history significant for hypertension, COPD, GERD, nicotine use, hyperlipidemia.  He states that all last week he felt bad .  He had fatigue and at one point had some mild dyspnea and thought he may be developing walking pneumonia .  No chest pain.  Went to bed Saturday night woke up around 1:30 AM with some sweats and chest discomfort.  No radiation to the left arm or neck or head.  No nausea or vomiting.  Symptoms persisted and he called his daughter and was taken to ER at [facility].  Labs there confirmed ST elevation MI.  He had cath the next day which showed 30% LAD lesion 99% RCA lesion which was successfully stented.  He had soft blood pressure post cath and amlodipine  held.  Maintained on losartan  50 mg daily.  Rosuvastatin  was increased from 10 to 40 mg.  His LDL cholesterol 63 during hospitalization.  Patient on aspirin  prior to hospitalization and addition of Brilinta  for 1 year.  He has done well since discharge.  He had hematoma right arm which is slowly improving.  No chest pains.  Feels better overall.  Echocardiogram revealed EF 55 to 60% with mild hypokinesis of the left ventricular, basal mid inferior wall, and inferolateral wall.  No major valve issues.  Had been smoking about 3/4 pack cigarettes per day prior to admission and has been totally abstinent since then and does not plan to go back.  Blood pressure since discharge to have ranged generally between 120s systolic to occasional around 859 with diastolics mostly 70s and 80s  Past Medical History:  Diagnosis Date   Acute prostatitis 09/21/2016   AKI (acute kidney injury) (HCC)    Asthma    Cancer (HCC)    Chest pain     COPD (chronic obstructive pulmonary disease) (HCC)    Dyspnea    Emphysema of lung (HCC)    Epididymoorchitis 01/28/2017   Erectile dysfunction    Family history of ischemic heart disease and other diseases of the circulatory system    GERD (gastroesophageal reflux disease) 10/25/2010   History of leukocytosis    Hyperlipidemia    Hypertension    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   Hypokalemia    Nausea    Nicotine abuse 10/25/2010   Prostatitis    Sepsis (HCC) 09/21/2016   Skin cancer    Past Surgical History:  Procedure Laterality Date   COLONOSCOPY  2010   2016   CORONARY/GRAFT ACUTE MI REVASCULARIZATION N/A 10/28/2023   Procedure: Coronary/Graft Acute MI Revascularization;  Surgeon: Verlin Lonni BIRCH, MD;  Location: [facility];  Service: Cardiovascular;  Laterality: N/A;   INGUINAL HERNIA REPAIR Right 04/11/2023   Procedure: OPEN RIGHT INGUINAL HERNIA REPAIR WITH MESH;  Surgeon: Vernetta Berg, MD;  Location: [facility];  Service: General;  Laterality: Right;   LEFT HEART CATH AND CORONARY ANGIOGRAPHY N/A 10/28/2023   Procedure: LEFT HEART CATH AND CORONARY ANGIOGRAPHY;  Surgeon: Verlin Lonni BIRCH, MD;  Location: [facility];  Service: Cardiovascular;  Laterality: N/A;   SKIN TAG REMOVAL  11/2019   TONSILLECTOMY      reports that he has been smoking  e-cigarettes and cigarettes. He started smoking about 61 years ago. He has a 106 pack-year smoking history. He has quit using smokeless tobacco. He reports that he does not currently use drugs. He reports that he does not drink alcohol. family history includes Heart disease (age of onset: 23) in his father; Hypertension in his father. Allergies  Allergen Reactions   Sulfa Antibiotics Other (See Comments)    Unknown reaction    Review of Systems  Constitutional:  Negative for malaise/fatigue.  Eyes:  Negative for blurred vision.  Respiratory:  Negative for cough, hemoptysis and shortness of  breath.   Cardiovascular:  Negative for chest pain and leg swelling.  Gastrointestinal:  Negative for abdominal pain.  Neurological:  Negative for dizziness, weakness and headaches.      Objective:     BP 124/76   Pulse 68   Temp 97.8 F (36.6 C) (Oral)   Wt 174 lb 4.8 oz (79.1 kg)   SpO2 97%   BMI 30.88 kg/m  BP Readings from Last 3 Encounters:  11/06/23 124/76  10/29/23 99/61  08/28/23 (!) 142/80   Wt Readings from Last 3 Encounters:  11/06/23 174 lb 4.8 oz (79.1 kg)  10/28/23 175 lb 7.8 oz (79.6 kg)  08/28/23 178 lb 8 oz (81 kg)      Physical Exam Vitals reviewed.  Constitutional:      Appearance: He is well-developed.  HENT:     Right Ear: External ear normal.     Left Ear: External ear normal.  Eyes:     Pupils: Pupils are equal, round, and reactive to light.  Neck:     Thyroid : No thyromegaly.  Cardiovascular:     Rate and Rhythm: Normal rate and regular rhythm.  Pulmonary:     Effort: Pulmonary effort is normal. No respiratory distress.     Breath sounds: Normal breath sounds. No wheezing or rales.  Musculoskeletal:     Cervical back: Neck supple.  Skin:    Comments: Left thumb near the base reveals nodular lesion about 8 to 9 mm in diameter with slightly ulcerated center  Neurological:     Mental Status: He is alert and oriented to person, place, and time.      No results found for any visits on 11/06/23.  Last CBC Lab Results  Component Value Date   WBC 14.2 (H) 10/29/2023   HGB 12.0 (L) 10/29/2023   HCT 36.3 (L) 10/29/2023   MCV 94.3 10/29/2023   MCH 31.2 10/29/2023   RDW 13.4 10/29/2023   PLT 198 10/29/2023   Last metabolic panel Lab Results  Component Value Date   GLUCOSE 109 (H) 10/29/2023   NA 139 10/29/2023   K 3.9 10/29/2023   CL 106 10/29/2023   CO2 24 10/29/2023   BUN 12 10/29/2023   CREATININE 0.90 10/29/2023   GFRNONAA >60 10/29/2023   CALCIUM  8.4 (L) 10/29/2023   PHOS 3.3 04/25/2020   PROT 6.7 01/19/2023   ALBUMIN  4.0 01/19/2023   BILITOT 0.8 01/19/2023   ALKPHOS 68 01/19/2023   AST 14 01/19/2023   ALT 13 01/19/2023   ANIONGAP 9 10/29/2023   Last lipids Lab Results  Component Value Date   CHOL 143 10/28/2023   HDL 44 10/28/2023   LDLCALC 63 10/28/2023   LDLDIRECT 76.0 01/03/2021   TRIG 180 (H) 10/28/2023   CHOLHDL 3.3 10/28/2023   Last hemoglobin A1c Lab Results  Component Value Date   HGBA1C 6.1 (H) 10/28/2023  The ASCVD Risk score (Arnett DK, et al., 2019) failed to calculate for the following reasons:   Risk score cannot be calculated because patient has a medical history suggesting prior/existing ASCVD    Assessment & Plan:   #1 recent ST elevation MI with 99% RCA lesion which was stented.  Patient symptomatically improved at this time.  We discussed several things today as follows  -Continue dual antiplatelet therapy with aspirin  and Brilinta  for 1 year as per cardiology suggestions - Continue to avoid nicotine-stopped smoking 3 weeks ago.   - Continue high-dose Crestor  40 mg daily - Set up future labs for lipid and hepatic in about 2 months - Avoid nonsteroidals.  He had questions today regarding refill of meloxicam and we have discouraged that in the setting of recent acute MI  #2 dyslipidemia.  Goal LDL less than 55.  Recent LDL 63 and now on higher dose statin.  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  He is scheduled follow-up with dermatology in a month will bring to their attention at that point.  #4 hypertension.  Controlled fairly well today.  Recent discontinuation of amlodipine .  Continue close home monitoring and consider starting back amlodipine  if consistently systolic readings over 135-140.  He will continue losartan  at this time  Wolm Scarlet, MD

## 2023-11-07 ENCOUNTER — Ambulatory Visit: Attending: Student | Admitting: Student

## 2023-11-07 ENCOUNTER — Encounter: Payer: Self-pay | Admitting: Student

## 2023-11-07 VITALS — BP 120/60 | HR 65 | Ht 63.0 in | Wt 175.8 lb

## 2023-11-07 DIAGNOSIS — S40021D Contusion of right upper arm, subsequent encounter: Secondary | ICD-10-CM

## 2023-11-07 DIAGNOSIS — I2510 Atherosclerotic heart disease of native coronary artery without angina pectoris: Secondary | ICD-10-CM | POA: Diagnosis not present

## 2023-11-07 DIAGNOSIS — Z720 Tobacco use: Secondary | ICD-10-CM

## 2023-11-07 DIAGNOSIS — I252 Old myocardial infarction: Secondary | ICD-10-CM

## 2023-11-07 DIAGNOSIS — E785 Hyperlipidemia, unspecified: Secondary | ICD-10-CM

## 2023-11-07 DIAGNOSIS — I10 Essential (primary) hypertension: Secondary | ICD-10-CM

## 2023-11-07 NOTE — Patient Instructions (Signed)
 Medication Instructions:  Your physician has recommended you make the following change in your medication:  RESTART AMLODIPINE  5 MG DAILY.   *If you need a refill on your cardiac medications before your next appointment, please call your pharmacy*  Lab Work: TODAY: BMET, CBC If you have labs (blood work) drawn today and your tests are completely normal, you will receive your results only by: MyChart Message (if you have MyChart) OR A paper copy in the mail If you have any lab test that is abnormal or we need to change your treatment, we will call you to review the results.  Testing/Procedures: NONE  Follow-Up: At [facility], you and your health needs are our priority.  As part of our continuing mission to provide you with exceptional heart care, our providers are all part of one team.  This team includes your primary Cardiologist (physician) and Advanced Practice Providers or APPs (Physician Assistants and Nurse Practitioners) who all work together to provide you with the care you need, when you need it.  Your next appointment:   3 month(s)  Provider:   CALLIE GOODRICH, PA-C  We recommend signing up for the patient portal called MyChart.  Sign up information is provided on this After Visit Summary.  MyChart is used to connect with patients for Virtual Visits (Telemedicine).  Patients are able to view lab/test results, encounter notes, upcoming appointments, etc.  Non-urgent messages can be sent to your provider as well.   To learn more about what you can do with MyChart, go to [email or web address].

## 2023-11-08 LAB — CBC
Hematocrit: 41.8 % (ref 37.5–51.0)
Hemoglobin: 14.1 g/dL (ref 13.0–17.7)
MCH: 31.9 pg (ref 26.6–33.0)
MCHC: 33.7 g/dL (ref 31.5–35.7)
MCV: 95 fL (ref 79–97)
Platelets: 270 x10E3/uL (ref 150–450)
RBC: 4.42 x10E6/uL (ref 4.14–5.80)
RDW: 13 % (ref 11.6–15.4)
WBC: 12.1 x10E3/uL — ABNORMAL HIGH (ref 3.4–10.8)

## 2023-11-08 LAB — BASIC METABOLIC PANEL WITH GFR
BUN/Creatinine Ratio: 13 (ref 10–24)
BUN: 12 mg/dL (ref 8–27)
CO2: 22 mmol/L (ref 20–29)
Calcium: 9.5 mg/dL (ref 8.6–10.2)
Chloride: 103 mmol/L (ref 96–106)
Creatinine, Ser: 0.96 mg/dL (ref 0.76–1.27)
Glucose: 98 mg/dL (ref 70–99)
Potassium: 4.8 mmol/L (ref 3.5–5.2)
Sodium: 139 mmol/L (ref 134–144)
eGFR: 83 mL/min/1.73 (ref >59)

## 2023-11-09 ENCOUNTER — Telehealth (HOSPITAL_COMMUNITY): Payer: Self-pay

## 2023-11-09 ENCOUNTER — Ambulatory Visit: Payer: Self-pay | Admitting: Student

## 2023-11-09 NOTE — Telephone Encounter (Signed)
 Called patient regarding cardiac rehab, spoke with patient's daughter Mercy (DPR on file). She stated he is not interested in cardiac rehab. Informed her that if anything changes to call us  back and we'll reopen his referral.  Closing referral.

## 2023-11-09 NOTE — Telephone Encounter (Signed)
 Pt insurance is active and benefits verified through [employer]. Co-pay $0, DED $0/$0 met, out of pocket $0/$0 met, co-insurance 0%. No pre-authorization required. 11/09/2023 @ 3:23pm, spoke with Therisa, REF# [id number].  TCR/ICR? ICR Visit(date of service)limitation? No Can multiple codes be used on the same date of service/visit?(IF ITS A LIMIT) Yes  Is this a lifetime maximum or an annual maximum? Annual Has the member used any of these services to date? No Is there a time limit (weeks/months) on start of program and/or program completion? No

## 2023-11-14 DIAGNOSIS — L820 Inflamed seborrheic keratosis: Secondary | ICD-10-CM | POA: Diagnosis not present

## 2023-11-14 DIAGNOSIS — C44622 Squamous cell carcinoma of skin of right upper limb, including shoulder: Secondary | ICD-10-CM | POA: Diagnosis not present

## 2023-11-14 DIAGNOSIS — D485 Neoplasm of uncertain behavior of skin: Secondary | ICD-10-CM | POA: Diagnosis not present

## 2023-11-14 DIAGNOSIS — C44629 Squamous cell carcinoma of skin of left upper limb, including shoulder: Secondary | ICD-10-CM | POA: Diagnosis not present

## 2023-11-22 ENCOUNTER — Encounter: Payer: Self-pay | Admitting: Family Medicine

## 2023-11-22 ENCOUNTER — Ambulatory Visit (INDEPENDENT_AMBULATORY_CARE_PROVIDER_SITE_OTHER): Admitting: Otolaryngology

## 2023-11-22 VITALS — BP 120/78 | HR 67

## 2023-11-22 DIAGNOSIS — R0982 Postnasal drip: Secondary | ICD-10-CM | POA: Diagnosis not present

## 2023-11-22 DIAGNOSIS — J3089 Other allergic rhinitis: Secondary | ICD-10-CM

## 2023-11-22 DIAGNOSIS — K219 Gastro-esophageal reflux disease without esophagitis: Secondary | ICD-10-CM

## 2023-11-22 DIAGNOSIS — Z87891 Personal history of nicotine dependence: Secondary | ICD-10-CM | POA: Diagnosis not present

## 2023-11-22 DIAGNOSIS — J343 Hypertrophy of nasal turbinates: Secondary | ICD-10-CM

## 2023-11-22 DIAGNOSIS — J312 Chronic pharyngitis: Secondary | ICD-10-CM | POA: Diagnosis not present

## 2023-11-22 MED ORDER — LEVOCETIRIZINE DIHYDROCHLORIDE 5 MG PO TABS: 5.0000 mg | ORAL_TABLET | Freq: Every evening | ORAL | 3 refills | Status: AC

## 2023-11-22 MED ORDER — FLUTICASONE PROPIONATE 50 MCG/ACT NA SUSP: 2.0000 | Freq: Two times a day (BID) | NASAL | 6 refills | Status: AC

## 2023-11-22 NOTE — Patient Instructions (Addendum)
-   stop using Afrin - start Xyzal  and Flonase    GamingLesson.nl - check out this website to learn more about reflux   -Avoid lying down for at least two hours after a meal or after drinking acidic beverages, like soda, or other caffeinated beverages. This can help to prevent stomach contents from flowing back into the esophagus. -Keep your head elevated while you sleep. Using an extra pillow or two can also help to prevent reflux. -Eat smaller and more frequent meals each day instead of a few large meals. This promotes digestion and can aid in preventing heartburn. -Wear loose-fitting clothes to ease pressure on the stomach, which can worsen heartburn and reflux. -Reduce excess weight around the midsection. This can ease pressure on the stomach. Such pressure can force some stomach contents back up the esophagus  - Take Reflux Gourmet (natural supplement available on Amazon) to help with symptoms of chronic throat irritation      Aureliano Med Nasal Saline Rinse   - start nasal saline rinses with NeilMed Bottle available over the counter or online to help with nasal congestion

## 2023-11-22 NOTE — Progress Notes (Signed)
 ENT CONSULT:  Reason for Consult: chronic sore throat    HPI: Discussed the use of AI scribe software for clinical note transcription with the patient, who gave verbal consent to proceed.  History of Present Illness Martin Navarro is a 73 year old male with hx COPD, former smoker, quit 1 month ago, who presents with a chronic sore throat.  He has been experiencing a sore throat that becomes scratchy, particularly in cold weather or when entering air-conditioned environments. This has been a recurring issue whenever he is exposed to cold conditions. He recently quit smoking about a month ago, which he believes has helped alleviate some throat irritation.  He previously consulted with another physician, who advised him to stop using his inhaler temporarily to see if it was contributing to his symptoms, but this did not result in any improvement, so he resumed its use. He has been using Afrin nasal spray frequently, often more than once a day.  He recently had a lesion removed from his thumb, which was diagnosed as squamous cell carcinoma, but it was completely excised. Another lesion on his arm was also identified as squamous cell carcinoma, and further excision is planned to ensure clean margins.  He uses omeprazole  for reflux.   Records Reviewed:  Dr Micheal Office Visit 11/06/23 #1 recent ST elevation MI with 99% RCA lesion which was stented.  Patient symptomatically improved at this time.  We discussed several things today as follows   -Continue dual antiplatelet therapy with aspirin  and Brilinta  for 1 year as per cardiology suggestions - Continue to avoid nicotine-stopped smoking 3 weeks ago.   - Continue high-dose Crestor  40 mg daily - Set up future labs for lipid and hepatic in about 2 months - Avoid nonsteroidals.  He had questions today regarding refill of meloxicam and we have discouraged that in the setting of recent acute MI   #2 dyslipidemia.  Goal LDL less than 55.  Recent  LDL 63 and now on higher dose statin.  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  He is scheduled follow-up with dermatology in a month will bring to their attention at that point.   #4 hypertension.  Controlled fairly well today.  Recent discontinuation of amlodipine .  Continue close home monitoring and consider starting back amlodipine  if consistently systolic readings over 135-140.  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   Hypokalemia    Nausea    Nicotine abuse 10/25/2010   Prostatitis    Sepsis (HCC) 09/21/2016   Skin cancer     Past Surgical History:  Procedure Laterality Date   COLONOSCOPY  2010   2016   CORONARY/GRAFT ACUTE MI REVASCULARIZATION N/A 10/28/2023   Procedure: Coronary/Graft Acute MI Revascularization;  Surgeon: Verlin Lonni BIRCH, MD;  Location: [facility];  Service: Cardiovascular;  Laterality: N/A;   INGUINAL HERNIA REPAIR Right 04/11/2023   Procedure: OPEN RIGHT INGUINAL HERNIA REPAIR WITH MESH;  Surgeon: Vernetta Berg, MD;  Location: [facility];  Service: General;  Laterality: Right;   LEFT HEART CATH AND CORONARY ANGIOGRAPHY N/A 10/28/2023   Procedure: LEFT HEART CATH AND CORONARY ANGIOGRAPHY;  Surgeon: Verlin Lonni BIRCH, MD;  Location: [facility];  Service: Cardiovascular;  Laterality: N/A;   SKIN TAG REMOVAL  11/2019   TONSILLECTOMY       Family History  Problem Relation Age of Onset   Hypertension Father    Heart disease Father 67       CABG   Colon cancer Neg Hx    Esophageal cancer Neg Hx    Rectal cancer Neg Hx    Stomach cancer Neg Hx     Social History:  reports that he has been smoking e-cigarettes and cigarettes. He started smoking about 61 years ago. He has a 106 pack-year smoking history. He has quit using smokeless tobacco. He reports that he does not currently use drugs. He reports that he does not drink alcohol.  Allergies:  Allergies  Allergen Reactions   Sulfa Antibiotics Other (See Comments)    Unknown reaction    Medications: I have reviewed the patient's current medications.  The PMH, PSH, Medications, Allergies, and SH were reviewed and updated.  ROS: Constitutional: Negative for fever, weight loss and weight gain. Cardiovascular: Negative for chest pain and dyspnea on exertion. Respiratory: Is not experiencing shortness of breath at rest. Gastrointestinal: Negative for nausea and vomiting. Neurological: Negative for headaches. Psychiatric: The patient is not nervous/anxious  Blood pressure 120/78, pulse 67, SpO2 95%. There is no height or weight on file to calculate BMI.  PHYSICAL EXAM:  Exam: General: Well-developed, well-nourished Respiratory Respiratory effort: Equal inspiration and expiration without stridor Cardiovascular Peripheral Vascular: Warm extremities with equal color/perfusion Eyes: No nystagmus with equal extraocular motion bilaterally Neuro/Psych/Balance: Patient oriented to person, place, and time; Appropriate mood and affect; Gait is intact with no imbalance; Cranial nerves I-XII are intact Head and Face Inspection: Normocephalic and atraumatic without mass or lesion Palpation: Facial skeleton intact without bony stepoffs Salivary Glands: No mass or tenderness Facial Strength: Facial motility symmetric and full bilaterally ENT Pinna: External ear intact and  fully developed External canal: Canal is patent with intact skin Tympanic Membrane: Clear and mobile External Nose: No scar or anatomic deformity Internal Nose: Septum is straight. No polyp, or purulence. Mucosal edema and erythema present.  Bilateral inferior turbinate hypertrophy.  Lips, Teeth, and gums: Mucosa and teeth intact and viable TMJ: No pain to palpation with full mobility Oral cavity/oropharynx: No erythema or exudate, no lesions present Nasopharynx: No mass or lesion with intact mucosa Hypopharynx: Intact mucosa without pooling of secretions Larynx Glottic: Full true vocal cord mobility without lesion or mass Supraglottic: Normal appearing epiglottis and AE folds Interarytenoid Space: Moderate pachydermia&edema Subglottic Space: Patent without lesion or edema Neck Neck and Trachea: Midline trachea without mass or lesion Thyroid : No mass or nodularity Lymphatics: No lymphadenopathy  Procedure: Preoperative diagnosis: chronic sore throat  Postoperative diagnosis:   Same  Procedure: Flexible fiberoptic laryngoscopy  Surgeon: Elena Velton, MD  Anesthesia: Topical lidocaine  and Afrin Complications: None Condition is stable throughout exam  Indications and consent:  The patient presents to the clinic with above symptoms. Indirect laryngoscopy view was incomplete. Thus it was recommended that they undergo a flexible fiberoptic laryngoscopy. All of the risks, benefits, and potential  complications were reviewed with the patient preoperatively and verbal informed consent was obtained.  Procedure: The patient was seated upright in the clinic. Topical lidocaine  and Afrin were applied to the nasal cavity. After adequate anesthesia had occurred, I then proceeded to pass the flexible telescope into the nasal cavity. The nasal cavity was patent without rhinorrhea or polyp. The nasopharynx was also patent without mass or lesion. The base of tongue was visualized and was normal.  There were no signs of pooling of secretions in the piriform sinuses. The true vocal folds were mobile bilaterally. There were no signs of glottic or supraglottic mucosal lesion or mass. There was moderate interarytenoid pachydermia and post cricoid edema. The telescope was then slowly withdrawn and the patient tolerated the procedure throughout.    Studies Reviewed: 10/28/23 CXR LUNGS AND PLEURA: No focal pulmonary opacity. No pulmonary edema. No pleural effusion. No pneumothorax.   HEART AND MEDIASTINUM: No acute abnormality of the cardiac and mediastinal silhouettes.   BONES AND SOFT TISSUES: No acute osseous abnormality.   IMPRESSION: 1. No acute process.   Assessment/Plan: Encounter Diagnoses  Name Primary?   Chronic sore throat Yes   Former smoker    Chronic GERD    Environmental and seasonal allergies    Hypertrophy of both inferior nasal turbinates    Post-nasal drip     Assessment and Plan Assessment & Plan Chronic sore throat Chronic scratchy throat likely due to postnasal drainage or reflux, vs both. Exam overall reassuring including flexible laryngoscopy without masses or lesions.  - Prescribed Xyzal  5 mg daily for allergy management. - Prescribed Flonase  BID for nasal congestion. - Recommend nasal saline rinses with neti pot and distilled water. - management of GERD  GERD LPR - continue Prilosec 40 mg mg   -  Reflux Gourmet after meals - diet and lifestyle changes to minimize GERD - Refer to [employer] for dietary and lifestyle modifications/reflux cook book  Chronic nasal congestion with overuse of topical decongestants Chronic nasal congestion due to prolonged Afrin use and likely environmental allergies, advised against due to rebound congestion risk. - Instruct to discontinue Afrin. - Prescribe Flonase  for nasal congestion and Xyzal  5 mg daily - Recommend nasal saline rinses with neti pot and distilled water.      Thank you for allowing  me to participate in the care of this patient. Please do not hesitate to contact me with any questions or concerns.   Elena Tyshawn, MD Otolaryngology [facility] Phone: [phone] Fax: [phone]    11/22/2023, 9:40 AM

## 2023-11-23 MED ORDER — TICAGRELOR 90 MG PO TABS: 90.0000 mg | ORAL_TABLET | Freq: Two times a day (BID) | ORAL | 11 refills | Status: AC

## 2023-11-23 NOTE — Telephone Encounter (Signed)
 Yes ok to refill

## 2023-11-29 DIAGNOSIS — C44622 Squamous cell carcinoma of skin of right upper limb, including shoulder: Secondary | ICD-10-CM | POA: Diagnosis not present

## 2023-12-03 ENCOUNTER — Encounter: Payer: Self-pay | Admitting: Physician Assistant

## 2023-12-03 MED ORDER — ROSUVASTATIN CALCIUM 20 MG PO TABS: 20.0000 mg | ORAL_TABLET | Freq: Every day | ORAL | 3 refills | Status: DC

## 2023-12-06 DIAGNOSIS — T1511XA Foreign body in conjunctival sac, right eye, initial encounter: Secondary | ICD-10-CM | POA: Diagnosis not present

## 2023-12-06 DIAGNOSIS — H10013 Acute follicular conjunctivitis, bilateral: Secondary | ICD-10-CM | POA: Diagnosis not present

## 2023-12-08 DIAGNOSIS — H10013 Acute follicular conjunctivitis, bilateral: Secondary | ICD-10-CM | POA: Diagnosis not present

## 2023-12-28 ENCOUNTER — Encounter: Payer: Self-pay | Admitting: Physician Assistant

## 2023-12-28 ENCOUNTER — Other Ambulatory Visit: Payer: Self-pay

## 2023-12-28 ENCOUNTER — Emergency Department (HOSPITAL_BASED_OUTPATIENT_CLINIC_OR_DEPARTMENT_OTHER)
Admission: EM | Admit: 2023-12-28 | Discharge: 2023-12-28 | Disposition: A | Discharge status: Home / Self Care | Attending: Emergency Medicine | Admitting: Emergency Medicine

## 2023-12-28 ENCOUNTER — Emergency Department (HOSPITAL_BASED_OUTPATIENT_CLINIC_OR_DEPARTMENT_OTHER): Admitting: Radiology

## 2023-12-28 DIAGNOSIS — J449 Chronic obstructive pulmonary disease, unspecified: Secondary | ICD-10-CM | POA: Diagnosis not present

## 2023-12-28 DIAGNOSIS — J45909 Unspecified asthma, uncomplicated: Secondary | ICD-10-CM | POA: Insufficient documentation

## 2023-12-28 DIAGNOSIS — S29011A Strain of muscle and tendon of front wall of thorax, initial encounter: Secondary | ICD-10-CM | POA: Insufficient documentation

## 2023-12-28 DIAGNOSIS — S299XXA Unspecified injury of thorax, initial encounter: Secondary | ICD-10-CM | POA: Diagnosis present

## 2023-12-28 DIAGNOSIS — I2510 Atherosclerotic heart disease of native coronary artery without angina pectoris: Secondary | ICD-10-CM | POA: Insufficient documentation

## 2023-12-28 DIAGNOSIS — W01198A Fall on same level from slipping, tripping and stumbling with subsequent striking against other object, initial encounter: Secondary | ICD-10-CM | POA: Diagnosis not present

## 2023-12-28 DIAGNOSIS — R079 Chest pain, unspecified: Secondary | ICD-10-CM | POA: Diagnosis not present

## 2023-12-28 DIAGNOSIS — R0789 Other chest pain: Secondary | ICD-10-CM

## 2023-12-28 DIAGNOSIS — Z79899 Other long term (current) drug therapy: Secondary | ICD-10-CM | POA: Diagnosis not present

## 2023-12-28 DIAGNOSIS — I10 Essential (primary) hypertension: Secondary | ICD-10-CM | POA: Diagnosis not present

## 2023-12-28 DIAGNOSIS — Z85828 Personal history of other malignant neoplasm of skin: Secondary | ICD-10-CM | POA: Insufficient documentation

## 2023-12-28 LAB — BASIC METABOLIC PANEL WITH GFR
Anion gap: 10 (ref 5–15)
BUN: 12 mg/dL (ref 8–23)
CO2: 24 mmol/L (ref 22–32)
Calcium: 9.8 mg/dL (ref 8.9–10.3)
Chloride: 106 mmol/L (ref 98–111)
Creatinine, Ser: 0.92 mg/dL (ref 0.61–1.24)
GFR, Estimated: >60 mL/min (ref >60)
Glucose, Bld: 112 mg/dL — ABNORMAL HIGH (ref 70–99)
Potassium: 4.6 mmol/L (ref 3.5–5.1)
Sodium: 140 mmol/L (ref 135–145)

## 2023-12-28 LAB — CBC WITH DIFFERENTIAL/PLATELET
Abs Immature Granulocytes: 0.03 K/uL (ref 0.00–0.07)
Basophils Absolute: 0.1 K/uL (ref 0.0–0.1)
Basophils Relative: 1 %
Eosinophils Absolute: 0.2 K/uL (ref 0.0–0.5)
Eosinophils Relative: 3 %
HCT: 43.2 % (ref 39.0–52.0)
Hemoglobin: 14.6 g/dL (ref 13.0–17.0)
Immature Granulocytes: 0 %
Lymphocytes Relative: 26 %
Lymphs Abs: 2.2 K/uL (ref 0.7–4.0)
MCH: 31.3 pg (ref 26.0–34.0)
MCHC: 33.8 g/dL (ref 30.0–36.0)
MCV: 92.7 fL (ref 80.0–100.0)
Monocytes Absolute: 1.1 K/uL — ABNORMAL HIGH (ref 0.1–1.0)
Monocytes Relative: 12 %
Neutro Abs: 5.1 K/uL (ref 1.7–7.7)
Neutrophils Relative %: 58 %
Platelets: 217 K/uL (ref 150–400)
RBC: 4.66 MIL/uL (ref 4.22–5.81)
RDW: 13.3 % (ref 11.5–15.5)
WBC: 8.7 K/uL (ref 4.0–10.5)
nRBC: 0 % (ref 0.0–0.2)

## 2023-12-28 LAB — TROPONIN T, HIGH SENSITIVITY: Troponin T High Sensitivity: 16 ng/L (ref 0–19)

## 2023-12-28 MED ORDER — TICAGRELOR 90 MG PO TABS
90.0000 mg | ORAL_TABLET | Freq: Once | ORAL | Status: AC
  Administered 2023-12-28: 90 mg via ORAL
  Filled 2023-12-28: qty 1

## 2023-12-28 MED ORDER — CYCLOBENZAPRINE HCL 10 MG PO TABS
10.0000 mg | ORAL_TABLET | Freq: Two times a day (BID) | ORAL | 0 refills | Status: DC | PRN
Start: 2023-12-28 — End: 2024-01-07

## 2023-12-28 NOTE — ED Provider Notes (Signed)
 [place] EMERGENCY DEPARTMENT AT [facility] Provider Note   CSN: [id number] Arrival date & time: 12/28/23  1017     Patient presents with: Martin Navarro is a 73 y.o. male.   HPI     73yo male with history of COPD, CAD, hypertension, hyperlipidemia,hx of STEMI 10/27/2024 s/ DES to prox RCA who presents with concern for fall and chest wall pain  He was stepping out of a forklift and foot slipped, and as he was hanging onto the side he swung around like a monkey landing on his feet towards the front of the vehicle. His left leg hit the tire but does not have significant pain there. He did not have physical contact with the vehicle with his chest or back and did not fall to the ground. Did not lose consciousness or hit his head.  He normally would not come in for thsi but he just had a stent placed and is worried it could have come dislodged.  His left chest is sore reports has not had pain like this. It is worse with palpation and some movements. No associated dyspnea, nausea, vomiting, nor dizziness.  No radiatino of pain. Is on brilinta .    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 Authorizing Provider  cyclobenzaprine (FLEXERIL) 10 MG tablet Take 1 tablet (10 mg total) by mouth 2 (two) times  daily as needed for muscle spasms. 12/28/23  Yes Dreama Longs, MD  amLODipine  (NORVASC ) 5 MG tablet Take 5 mg by mouth daily.    [provider]  ANORO ELLIPTA  62.5-25 MCG/ACT AEPB USE 1 INHALATION DAILY 06/28/23   Mannam, Praveen, MD  aspirin  81 MG tablet Take 81 mg by mouth at bedtime.    [provider]  Cyanocobalamin  (VITAMIN B-12 PO) Take 1 tablet by mouth at bedtime.    [provider]  fluticasone  (FLONASE ) 50 MCG/ACT nasal spray Place 2 sprays into both nostrils 2 (two) times daily. 11/22/23   Soldatova, Liuba, MD  levocetirizine (XYZAL  ALLERGY 24HR) 5 MG tablet Take 1 tablet (5 mg total) by mouth every evening. 11/22/23   Soldatova, Liuba, MD  losartan  (COZAAR ) 50 MG tablet TAKE ONE (1) TABLET BY MOUTH EVERY DAY Patient taking differently: Take 50 mg by mouth at bedtime. TAKE ONE (1) TABLET BY MOUTH EVERY DAY 01/19/23   Burchette, Wolm ORN, MD  nitroGLYCERIN  (NITROSTAT ) 0.4 MG SL tablet Place 1 tablet (0.4 mg total) under the tongue every 5 (five) minutes as needed for chest pain. 10/29/23 10/28/24  Vicci Rollo JONELLE, PA-C  Omega-3 Fatty Acids (FISH OIL PO) Take 1 capsule by mouth at bedtime.    [provider]  omeprazole  (PRILOSEC) 40 MG capsule Take 1 capsule (40 mg total) by mouth daily. Patient taking differently: Take 40 mg by mouth at bedtime. 01/19/23   Burchette, Wolm ORN, MD  rosuvastatin  (CRESTOR ) 20 MG tablet Take 1 tablet (20 mg total) by mouth daily. 12/03/23 03/02/24  Lucien Orren SAILOR, PA-C  ticagrelor  (BRILINTA ) 90 MG TABS tablet Take 1 tablet (90 mg total) by mouth 2 (two) times daily. 11/23/23   Ozell Heron HERO, MD  VITAMIN E  PO Take 1 capsule by mouth at bedtime.    [provider]    Allergies: Sulfa antibiotics    Review of Systems  Updated Vital Signs BP (!) 141/77   Pulse 62   Temp 97.9 F (36.6 C) (Oral)   Resp 20   SpO2 100%   Physical Exam Vitals and nursing note reviewed.  Constitutional:      General: He is  not in acute distress.    Appearance: He is well-developed. He is not diaphoretic.  HENT:     Head: Normocephalic and atraumatic.  Eyes:     Conjunctiva/sclera: Conjunctivae normal.  Cardiovascular:     Rate and Rhythm: Normal rate and regular rhythm.     Pulses: Normal pulses.     Heart sounds: Normal heart sounds. No murmur heard.    No friction rub. No gallop.  Pulmonary:     Effort: Pulmonary effort is normal. No respiratory distress.     Breath sounds: Normal breath sounds. No wheezing or rales.  Chest:     Chest wall: Tenderness present.  Abdominal:     General: There is no distension.     Palpations: Abdomen is soft.     Tenderness: There is no abdominal tenderness. There is no guarding.  Musculoskeletal:     Cervical back: Normal range of motion.  Skin:    General: Skin is warm and dry.  Neurological:     Mental Status: He is alert and oriented to person, place, and time.     (all labs ordered are listed, but only abnormal results are displayed) Labs Reviewed  CBC WITH DIFFERENTIAL/PLATELET - Abnormal; Notable for the following components:      Result Value   Monocytes Absolute 1.1 (*)    All other components within normal limits  BASIC METABOLIC PANEL WITH GFR - Abnormal; Notable for the following components:   Glucose, Bld 112 (*)    All other components within normal limits  TROPONIN T, HIGH SENSITIVITY  TROPONIN T, HIGH SENSITIVITY    EKG: EKG Interpretation Date/Time:  Friday December 28 2023 12:26:16 EDT Ventricular Rate:  56 PR Interval:  220 QRS Duration:  90 QT Interval:  404 QTC Calculation: 390 R Axis:   -7  Text Interpretation: Sinus rhythm Prolonged PR interval LVH with secondary repolarization abnormality Inferior infarct, age indeterminate No significant change since last tracing Confirmed by Dreama Longs ([id number]) on 12/28/2023 12:32:06 PM  Radiology: [facility] Chest 2 View Result Date: 12/28/2023 CLINICAL DATA:  Left chest pain after hanging  with arm off of fork lift. EXAM: CHEST - 2 VIEW COMPARISON:  Portable chest x-ray 10/28/2023 FINDINGS: The heart size and mediastinal contours are within normal limits. Both lungs are clear and without consolidation. No pneumothorax or pleural effusion. The visualized skeletal do not demonstrate displaced fracture. IMPRESSION: No acute cardiopulmonary disease or evidence of displaced fracture. Electronically Signed   By: Cordella Banner   On: 12/28/2023 12:23     Procedures   Medications Ordered in the ED  ticagrelor  (BRILINTA ) tablet 90 mg (90 mg Oral Given 12/28/23 1221)                                     73yo male with history of COPD, CAD, hypertension, hyperlipidemia,hx of STEMI 10/27/2024 s/ DES to prox RCA who presents with concern for fall and chest wall pain  Given CAD with recent stent placement and chest pain, will evaluate for signs of ACS in the setting of stress sof this event. Has normal pulses, do not feel hx consistent with aortic dissection. Hx and eam also not consistent with PE.  EKG evaluated by me and shows normal sinus rhythm without acute ST changes. CXR evaluated and interpreted by me and radiology shows no pneumothorax, pneumonia or other acute abnormalities.  Labs obtained and interpreted by me show no evidence of clinically significant anemia, electrolyte abnormalities or ACS.  Low clinical suspicion for occult rib fracture as he denies blunt trauma to the chest. Suspect most likely pain related to pectoralis strain from swinging from the forklift. Gave rx for flexeril he can take prn, recommend lidocaine , tylenol , heat/ice. Patient discharged in stable condition with understanding of reasons to return.      Final diagnoses:  Left-sided chest wall pain  Pectoralis muscle strain, initial encounter    ED Discharge Orders          Ordered    cyclobenzaprine (FLEXERIL) 10 MG tablet  2 times daily PRN        12/28/23 1254               Dreama Longs, MD 12/29/23 [phone]

## 2023-12-28 NOTE — ED Notes (Signed)
 Pt d/c instructions, medications, and follow-up care reviewed with pt. Pt verbalized understanding and had no further questions at time of d/c. Pt CA&Ox4, ambulatory, and in NAD at time of d/c

## 2023-12-28 NOTE — ED Triage Notes (Signed)
 States fell off forklift this morning. Reports hitting back and left ribs. Denies hitting head. Recent cardiac stent placed on 8/3

## 2024-01-01 ENCOUNTER — Ambulatory Visit: Payer: Self-pay | Admitting: Family Medicine

## 2024-01-01 ENCOUNTER — Other Ambulatory Visit (INDEPENDENT_AMBULATORY_CARE_PROVIDER_SITE_OTHER)

## 2024-01-01 DIAGNOSIS — E785 Hyperlipidemia, unspecified: Secondary | ICD-10-CM | POA: Diagnosis not present

## 2024-01-01 DIAGNOSIS — R739 Hyperglycemia, unspecified: Secondary | ICD-10-CM

## 2024-01-01 LAB — HEPATIC FUNCTION PANEL
ALT: 12 U/L (ref 0–53)
AST: 14 U/L (ref 0–37)
Albumin: 4.2 g/dL (ref 3.5–5.2)
Alkaline Phosphatase: 66 U/L (ref 39–117)
Bilirubin, Direct: 0.2 mg/dL (ref 0.0–0.3)
Total Bilirubin: 0.8 mg/dL (ref 0.2–1.2)
Total Protein: 6.7 g/dL (ref 6.0–8.3)

## 2024-01-01 LAB — LIPID PANEL
Cholesterol: 140 mg/dL (ref 0–200)
HDL: 42.3 mg/dL (ref >39.00)
LDL Cholesterol: 76 mg/dL (ref 0–99)
NonHDL: 97.47
Total CHOL/HDL Ratio: 3
Triglycerides: 105 mg/dL (ref 0.0–149.0)
VLDL: 21 mg/dL (ref 0.0–40.0)

## 2024-01-05 ENCOUNTER — Other Ambulatory Visit: Payer: Self-pay | Admitting: Family Medicine

## 2024-01-05 DIAGNOSIS — K219 Gastro-esophageal reflux disease without esophagitis: Secondary | ICD-10-CM

## 2024-01-07 ENCOUNTER — Encounter: Payer: Self-pay | Admitting: Family Medicine

## 2024-01-07 ENCOUNTER — Ambulatory Visit: Admitting: Family Medicine

## 2024-01-07 VITALS — BP 126/70 | HR 92 | Temp 97.8°F | Wt 177.5 lb

## 2024-01-07 DIAGNOSIS — E785 Hyperlipidemia, unspecified: Secondary | ICD-10-CM

## 2024-01-07 DIAGNOSIS — S299XXD Unspecified injury of thorax, subsequent encounter: Secondary | ICD-10-CM

## 2024-01-07 MED ORDER — CYCLOBENZAPRINE HCL 10 MG PO TABS: 10.0000 mg | ORAL_TABLET | Freq: Two times a day (BID) | ORAL | 0 refills | Status: DC | PRN

## 2024-01-07 NOTE — Progress Notes (Signed)
 Established Patient Office Visit  Subjective   Patient ID: Martin Navarro, male    DOB: 1950/04/10  Age: 73 y.o. MRN: [id number]  Chief Complaint  Patient presents with   Hospitalization Follow-up    HPI   Kester is here regarding recent ER visit following injury getting off a forklift.  He was stepping to get off the forklift and had his left hand on a overhead bar and his foot slipped and basically left him hanging for for a second before he fell off.  Denies any direct trauma to the chest.  He landed on his feet.  No direct physical contact with his chest or back.  No head injury.  No loss of consciousness.  He notes some left chest soreness.  His main concern is that he has cardiac history with recent stent placement and wondered if symptoms may be related to that.  He had chest x-ray, EKG, and labs including troponins which were all unremarkable.  He was prescribed Flexeril twice daily and feels like that may have helped.  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  He had history of STEMI 10-27-2024 with proximal RCA stent.  Denies any recent exertional chest pain.  He has had some exertional dyspnea with walking briskly.  Quit smoking 3 months ago.  Denies any chest tightness.  Hyperlipidemia.  Currently on rosuvastatin  20 mg daily.  He had recent LDL cholesterol of 76 we had recommended bumping up his rosuvastatin  to 40 mg with goal of LDL less than 55.  However, patient states he is already having some muscle aches at 20 mg.  These are tolerable.  He plans discussed with cardiology at follow-up in December.  Patient had inquired recently about getting follow-up A1c.  However, his last A1c was less than 3 months ago and stable at 6.1.  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   Hypokalemia    Nausea    Nicotine abuse 10/25/2010   Prostatitis    Sepsis (HCC) 09/21/2016   Skin cancer    Past Surgical History:  Procedure Laterality Date   COLONOSCOPY  2010   2016   CORONARY/GRAFT ACUTE MI REVASCULARIZATION N/A 10/28/2023   Procedure: Coronary/Graft Acute MI Revascularization;  Surgeon: Verlin Lonni BIRCH, MD;  Location: [facility];  Service: Cardiovascular;  Laterality: N/A;   INGUINAL HERNIA REPAIR Right 04/11/2023   Procedure: OPEN RIGHT INGUINAL HERNIA REPAIR WITH MESH;  Surgeon: Vernetta Berg, MD;  Location: [facility];  Service: General;  Laterality: Right;   LEFT HEART CATH AND CORONARY ANGIOGRAPHY N/A 10/28/2023   Procedure: LEFT HEART CATH AND CORONARY ANGIOGRAPHY;  Surgeon: Verlin Lonni BIRCH, MD;  Location: [facility];  Service: Cardiovascular;  Laterality: N/A;   SKIN TAG REMOVAL  11/2019   TONSILLECTOMY      reports that he has been smoking e-cigarettes and cigarettes. He started smoking about 61 years ago. He has a 106 pack-year smoking history. He has quit using smokeless tobacco. He reports that he does not currently use drugs. He reports that he does not drink alcohol. family history includes Heart disease (age of onset: 55) in his father; Hypertension in his father. Allergies  Allergen Reactions   Sulfa Antibiotics Other (See Comments)    Unknown reaction    Review of Systems  Constitutional:  Negative for chills and fever.  Respiratory:  Negative for  cough, hemoptysis and wheezing.   Cardiovascular:  Negative for chest pain.      Objective:     BP 126/70   Pulse 92   Temp 97.8 F (36.6 C) (Oral)   Wt 177 lb 8 oz (80.5 kg)   SpO2 95%   BMI 31.44 kg/m  BP Readings from Last 3 Encounters:  01/07/24 126/70  12/28/23 (!) 141/77  11/22/23 120/78   Wt Readings from Last 3 Encounters:  01/07/24 177 lb 8 oz (80.5 kg)  11/07/23 175 lb 12.8 oz (79.7 kg)  11/06/23 174 lb 4.8 oz (79.1 kg)      Physical Exam Vitals reviewed.  Constitutional:      General: He is not in acute distress.    Appearance: He is not ill-appearing or toxic-appearing.  Cardiovascular:     Rate and Rhythm: Normal rate and regular rhythm.  Pulmonary:     Effort: Pulmonary effort is normal.     Breath sounds: Normal breath sounds.  Neurological:     Mental Status: He is alert.      No results found for any visits on 01/07/24.  Last CBC Lab Results  Component Value Date   WBC 8.7 12/28/2023   HGB 14.6 12/28/2023   HCT 43.2 12/28/2023   MCV 92.7 12/28/2023   MCH 31.3 12/28/2023   RDW 13.3 12/28/2023   PLT 217 12/28/2023   Last metabolic panel Lab Results  Component Value Date   GLUCOSE 112 (H) 12/28/2023   NA 140 12/28/2023   K 4.6 12/28/2023   CL 106 12/28/2023   CO2 24 12/28/2023   BUN 12 12/28/2023   CREATININE 0.92 12/28/2023   GFRNONAA >60 12/28/2023   CALCIUM  9.8 12/28/2023   PHOS 3.3 04/25/2020   PROT 6.7 01/01/2024   ALBUMIN 4.2 01/01/2024   BILITOT 0.8 01/01/2024   ALKPHOS 66 01/01/2024   AST 14 01/01/2024   ALT 12 01/01/2024   ANIONGAP 10 12/28/2023   Last lipids Lab Results  Component Value Date   CHOL 140 01/01/2024   HDL 42.30 01/01/2024   LDLCALC 76 01/01/2024   LDLDIRECT 76.0 01/03/2021   TRIG 105.0 01/01/2024   CHOLHDL 3 01/01/2024   Last hemoglobin A1c Lab Results  Component Value Date   HGBA1C 6.1 (H) 10/28/2023      The ASCVD Risk score (Arnett DK, et al., 2019) failed to calculate for the  following reasons:   Risk score cannot be calculated because patient has a medical history suggesting prior/existing ASCVD    Assessment & Plan:   #1 left chest wall strain.  No evidence for cardiac issues or acute coronary syndrome from recent ER evaluation.  Symptoms have improved.  He is requesting refill of Flexeril and with caution about side effect such as dizziness or sedation but has not noted these.  Suspect this should continue to improve  #2 history of STEMI 8/25.  Patient has hyperlipidemia currently on rosuvastatin  20 mg.  He never titrated up to 40 mg following recent LDL 76 secondary to myalgias at 20 mg.  We did discuss other potential options such as addition of Zetia or even PCSK9 inhibitor.  He has follow-up with cardiology early December and plans to discuss with them.  Wolm Scarlet, MD

## 2024-01-21 ENCOUNTER — Ambulatory Visit: Admitting: Family Medicine

## 2024-01-26 ENCOUNTER — Other Ambulatory Visit: Payer: Self-pay | Admitting: Family Medicine

## 2024-02-06 ENCOUNTER — Ambulatory Visit: Admitting: Family Medicine

## 2024-02-19 ENCOUNTER — Other Ambulatory Visit: Payer: Self-pay | Admitting: Pulmonary Disease

## 2024-02-20 NOTE — Telephone Encounter (Signed)
 Anoro courtesy refill - pt will need an appt for further refills.

## 2024-03-01 NOTE — Progress Notes (Unsigned)
 Cardiology Office Note:    Date:  03/03/2024   ID:  Martin Navarro, DOB [birth year], MRN [id number]  PCP:  Martin Navarro ORN, MD  Cardiologist:  Martin Cash, MD     Referring MD: Martin Navarro ORN, MD   Chief Complaint: follow-up of CAD  History of Present Illness:    Martin Navarro is a 73 y.o. male with a history of CAD with STEMI in 10/2023 s/p DES to proximal RCA, COPD, hypertension, hyperlipidemia, GERD, hypogonadism, and tobacco abuse who is followed by Dr. Cash and presents today for follow-up of CAD.  Patient has a history of atypical chest pain and has had multiple nuclear stress tests over the years. He was admitted in 10/2023 for an acute inferior STEMI after presenting with chest pain that woke him up from sleep. High-sensitivity troponin peaked at 6,804. Emergent cardiac catheterization showed 99% stenosis of proximal RCA and 30% stenosis of mid LAD. He underwent successful PTCA/ DES to the proximal RCA. Echo showed LVEF of 55-60% with mild hypokinesis of the basal-mid inferior wall and inferolateral wall, normal RV function, and no significant valvular disease. He developed a significant right arm hematoma after cath. Vascular Surgery was consulted. CTA of the right upper extremity showed no evidence of vascular injury.   He was last seen by me for follow-up on 11/07/2023 at which time he reported a couple episodes of very mild aching in his chest but did not sound like angina but was overall doing well.  Patient presents today for follow-up. He is here with his daughter. He is doing well from a cardiac standpoint with no recurrent angina. His only concern today is about his Crestor . He was previously on Crestor  10mg  daily prior to MI and then was discharged on 40mg  daily after MI. He had myalgias with this. This was subsequently decreased to 20mg  daily but he continues to have diffuse myalgias with this. Patient/ daughter are interested in Repatha.    ROS: No chest  pain, shortness of breath, orthopnea, PND, lower extremity edema, palpitations, lightheadedness, dizziness, syncope. No abnormal bleeding in urine or stools.  EKGs/Labs/Other Studies Reviewed:    The following studies were reviewed:  Cardiac Catheterization 10/28/2023:   Prox RCA lesion is 99% stenosed.   Mid LAD lesion is 30% stenosed.   A drug-eluting stent was successfully placed using a STENT SYNERGY XD 3.0X12.   Post intervention, there is a 0% residual stenosis.   Acute inferior STEMI secondary to severe stenosis of the proximal RCA Successful PTCA/DES x proximal RCA Mild non-obstructive disease in the mid LAD LVEDP 14 mm Hg   Recommendations: Admit to the ICU. Aggrastat  infusion for 2 hours. DAPT with ASA and Brilinta  for one year. High intensity statin. Echo later today. Fast track discharge.    Diagnostic Dominance: Right  Intervention      _______________   Echocardiogram 10/28/2023: Impressions: 1. Left ventricular ejection fraction, by estimation, is 55 to 60%. The  left ventricle has normal function. The left ventricle demonstrates  regional wall motion abnormalities (see scoring diagram/findings for  description). Left ventricular diastolic  function could not be evaluated. There is mild hypokinesis of the left  ventricular, basal-mid inferior wall and inferolateral wall.   2. Right ventricular systolic function is normal. The right ventricular  size is normal.   3. The mitral valve is normal in structure. No evidence of mitral valve  regurgitation. No evidence of mitral stenosis.   4. The aortic valve was not well  visualized. Aortic valve regurgitation  is not visualized. Aortic valve sclerosis/calcification is present,  without any evidence of aortic stenosis.   5. The inferior vena cava is normal in size with greater than 50%  respiratory variability, suggesting right atrial pressure of 3 mmHg.   EKG:  EKG not ordered today.   Recent Labs: 12/28/2023: BUN  12; Creatinine, Ser 0.92; Hemoglobin 14.6; Platelets 217; Potassium 4.6; Sodium 140 01/01/2024: ALT 12  Recent Lipid Panel    Component Value Date/Time   CHOL 140 01/01/2024 0918   TRIG 105.0 01/01/2024 0918   HDL 42.30 01/01/2024 0918   CHOLHDL 3 01/01/2024 0918   VLDL 21.0 01/01/2024 0918   LDLCALC 76 01/01/2024 0918   LDLCALC 74 12/24/2019 0941   LDLDIRECT 76.0 01/03/2021 0921    Physical Exam:    Vital Signs: BP 138/62   Pulse 65   Ht 5' 5 (1.651 m)   Wt 182 lb 6.4 oz (82.7 kg)   SpO2 95%   BMI 30.35 kg/m     Wt Readings from Last 3 Encounters:  03/03/24 182 lb 6.4 oz (82.7 kg)  01/07/24 177 lb 8 oz (80.5 kg)  11/07/23 175 lb 12.8 oz (79.7 kg)     General: 73 y.o. Caucasian male in no acute distress. HEENT: Normocephalic and atraumatic. Sclera clear.  Neck: Supple. No carotid bruits. No JVD. Heart: RRR. Distinct S1 and S2. No murmurs, gallops, or rubs.  Lungs: No increased work of breathing. Clear to ausculation bilaterally. No wheezes, rhonchi, or rales.  Extremities: No lower extremity edema.  Skin: Warm and dry. Neuro: No focal deficits. Psych: Normal affect. Responds appropriately.  Assessment:    1. Coronary artery disease involving native coronary artery of native heart without angina pectoris   2. Essential hypertension   3. Hyperlipidemia, unspecified hyperlipidemia type   4. History of tobacco abuse     Plan:    CAD  History of inferior STEMI in 10/2023 s/p DES to proximal RCA.  - No angina.  - Continue DAPT with Aspirin  81mg  daily and Brilinta  90mg  twice daily. - Will decrease Crestor  to 10mg  daily given myalgias and refer to Lipid Clinic for consideration of PCSK9 inhibitor.  Hypertension BP  mildly elevated. Initially 144/70 and then 138/62 on my personal recheck at the end of visit. - Continue current medications: Amlodipine  5mg  daily and Losartan  50mg  daily.  - Asked patient to keep a BP/HR log for 1 week. If BP consistently >130/80, will  increase Amlodipine .   Hyperlipidemia Lipid panel in 12/2023: Total Cholesterol 140, Triglycerides 105, HDL 42.3, LDL 76. LDL goal <55 given CAD.  - Currently on Crestor  20mg  daily but reports diffuse myalgias with this. He previously did well on 10mg  daily. Patient/ daughter interested in Repatha. - Will decrease Crestor  back to 10mg  daily and refer to [facility] for consideration of PCSK9 inhibitor.   Tobacco Abuse He has not smoked since the time of his MI in 10/2023.  - Congratulated patient on this.  Disposition: Follow up in 6 months.    Signed, Aline FORBES Door, PA-C  03/03/2024 12:16 PM     [facility]

## 2024-03-03 ENCOUNTER — Encounter: Payer: Self-pay | Admitting: Student

## 2024-03-03 ENCOUNTER — Ambulatory Visit: Attending: Student | Admitting: Student

## 2024-03-03 ENCOUNTER — Ambulatory Visit: Admitting: Physician Assistant

## 2024-03-03 VITALS — BP 138/62 | HR 65 | Ht 65.0 in | Wt 182.4 lb

## 2024-03-03 DIAGNOSIS — I10 Essential (primary) hypertension: Secondary | ICD-10-CM | POA: Diagnosis not present

## 2024-03-03 DIAGNOSIS — I2510 Atherosclerotic heart disease of native coronary artery without angina pectoris: Secondary | ICD-10-CM

## 2024-03-03 DIAGNOSIS — E785 Hyperlipidemia, unspecified: Secondary | ICD-10-CM

## 2024-03-03 DIAGNOSIS — Z87891 Personal history of nicotine dependence: Secondary | ICD-10-CM | POA: Diagnosis not present

## 2024-03-03 MED ORDER — ROSUVASTATIN CALCIUM 10 MG PO TABS: 10.0000 mg | ORAL_TABLET | Freq: Every day | ORAL | 1 refills | Status: AC

## 2024-03-03 NOTE — Patient Instructions (Addendum)
 Thank you for choosing [facility]!     Medication Instructions:  Decrease the Crestor  to 10mg . Take one tablet daily.  A referral has been placed for the lipid clinic.  Check bp and hr for 1 week *If you need a refill on your cardiac medications before your next appointment, please call your pharmacy*   Lab Work: No labs were ordered during today's visit.  If you have labs (blood work) drawn today and your tests are completely normal, you will receive your results only by: MyChart Message (if you have MyChart) OR A paper copy in the mail If you have any lab test that is abnormal or we need to change your treatment, we will call you to review the results.   Testing/Procedures: No procedures were ordered during today's visit.   Your next appointment:   6 month(s) A letter will be mailed to you as a reminder to call the office for your next follow up appointment.    Provider:   Aline Door     Follow-Up: At [facility], you and your health needs are our priority.  As part of our continuing mission to provide you with exceptional heart care, we have created designated Provider Care Teams.  These Care Teams include your primary Cardiologist (physician) and Advanced Practice Providers (APPs -  Physician Assistants and Nurse Practitioners) who all work together to provide you with the care you need, when you need it. We recommend signing up for the patient portal called MyChart.  Sign up information is provided on this After Visit Summary.  MyChart is used to connect with patients for Virtual Visits (Telemedicine).  Patients are able to view lab/test results, encounter notes, upcoming appointments, etc.  Non-urgent messages can be sent to your provider as well.   To learn more about what you can do with MyChart, go to [email or web address].

## 2024-03-10 ENCOUNTER — Ambulatory Visit (HOSPITAL_BASED_OUTPATIENT_CLINIC_OR_DEPARTMENT_OTHER): Admission: RE | Admit: 2024-03-10 | Discharge: 2024-03-10 | Attending: Acute Care | Admitting: Acute Care

## 2024-03-10 DIAGNOSIS — Z87891 Personal history of nicotine dependence: Secondary | ICD-10-CM | POA: Diagnosis present

## 2024-03-10 DIAGNOSIS — F17210 Nicotine dependence, cigarettes, uncomplicated: Secondary | ICD-10-CM

## 2024-03-10 DIAGNOSIS — Z122 Encounter for screening for malignant neoplasm of respiratory organs: Secondary | ICD-10-CM

## 2024-03-14 ENCOUNTER — Other Ambulatory Visit: Payer: Self-pay

## 2024-03-14 DIAGNOSIS — F17210 Nicotine dependence, cigarettes, uncomplicated: Secondary | ICD-10-CM

## 2024-03-14 DIAGNOSIS — Z87891 Personal history of nicotine dependence: Secondary | ICD-10-CM

## 2024-03-14 DIAGNOSIS — Z122 Encounter for screening for malignant neoplasm of respiratory organs: Secondary | ICD-10-CM

## 2024-04-08 ENCOUNTER — Ambulatory Visit: Admitting: Family Medicine

## 2024-04-08 ENCOUNTER — Encounter: Payer: Self-pay | Admitting: Family Medicine

## 2024-04-08 ENCOUNTER — Ambulatory Visit: Payer: Self-pay | Admitting: Family Medicine

## 2024-04-08 VITALS — BP 134/74 | HR 74 | Temp 98.0°F | Ht 63.78 in | Wt 185.1 lb

## 2024-04-08 DIAGNOSIS — R739 Hyperglycemia, unspecified: Secondary | ICD-10-CM | POA: Diagnosis not present

## 2024-04-08 DIAGNOSIS — Z0000 Encounter for general adult medical examination without abnormal findings: Secondary | ICD-10-CM | POA: Diagnosis not present

## 2024-04-08 DIAGNOSIS — E785 Hyperlipidemia, unspecified: Secondary | ICD-10-CM

## 2024-04-08 LAB — COMPREHENSIVE METABOLIC PANEL WITH GFR
ALT: 14 U/L (ref 3–53)
AST: 15 U/L (ref 5–37)
Albumin: 4.2 g/dL (ref 3.5–5.2)
Alkaline Phosphatase: 66 U/L (ref 39–117)
BUN: 11 mg/dL (ref 6–23)
CO2: 27 meq/L (ref 19–32)
Calcium: 9.3 mg/dL (ref 8.4–10.5)
Chloride: 106 meq/L (ref 96–112)
Creatinine, Ser: 0.89 mg/dL (ref 0.40–1.50)
GFR: 84.23 mL/min
Glucose, Bld: 112 mg/dL — ABNORMAL HIGH (ref 70–99)
Potassium: 5 meq/L (ref 3.5–5.1)
Sodium: 139 meq/L (ref 135–145)
Total Bilirubin: 0.7 mg/dL (ref 0.2–1.2)
Total Protein: 6.9 g/dL (ref 6.0–8.3)

## 2024-04-08 LAB — LIPID PANEL
Cholesterol: 147 mg/dL (ref 28–200)
HDL: 45.8 mg/dL
LDL Cholesterol: 75 mg/dL (ref 10–99)
NonHDL: 101.36
Total CHOL/HDL Ratio: 3
Triglycerides: 130 mg/dL (ref 10.0–149.0)
VLDL: 26 mg/dL (ref 0.0–40.0)

## 2024-04-08 LAB — HEMOGLOBIN A1C: Hgb A1c MFr Bld: 6.4 % (ref 4.6–6.5)

## 2024-04-08 NOTE — Progress Notes (Signed)
 "  Established Patient Office Visit  Subjective   Patient ID: SIRIS HOOS, male    DOB: 10-Jul-1950  Age: 74 y.o. MRN: [id number]  Chief Complaint  Patient presents with   Annual Exam    HPI    Adit is here today for physical exam.  He had recent acute ST elevation MI inferior wall back in August.  Has done well since that time.  He had RCA stent placed at that point.  He did not tolerate high-dose rosuvastatin  40 mg and is currently back to 10 mg.  He has appointment Monday with clinical pharmacist with cardiology.  They have discussed possible PCSK9 use.  In addition to proximal RCA lesion 99% stenosis he had mid LAD lesion of 30%.  Other medical problems include history of hypertension, COPD, hyperlipidemia, history of nicotine use, and hyperglycemia.  He had 1 A1c over 6.5% which is back on 01-16-2022.  A1c since then have been low 6 range..  Currently on Brilinta , rosuvastatin , omeprazole , losartan , amlodipine , and aspirin .  Health maintenance reviewed:  Health Maintenance  Topic Date Due   Medicare Annual Wellness (AWV)  Never done   Zoster Vaccines- Shingrix (1 of 2) 06/10/1999   COVID-19 Vaccine (3 - 2025-26 season) 11/26/2023   Influenza Vaccine  06/24/2024 (Originally 10/26/2023)   Lung Cancer Screening  03/10/2025   DTaP/Tdap/Td (3 - Td or Tdap) 08/17/2030   Colonoscopy  05/31/2031   Pneumococcal Vaccine: 50+ Years  Completed   Hepatitis C Screening  Completed   Meningococcal B Vaccine  Aged Out   - He declines influenza vaccine - Has had prior Zostavax but not Shingrix. - Colonoscopy up-to-date - Has been getting low-dose CT lung cancer screen and had screening recently which showed no concerns.  Family history-father died age 51.  He had coronary disease age 15.  Sister who is 36 with no active medical problems.  Mother in her 57s  Social history-divorced.  He has 1 daughter.  No grandchildren.  Former smoker.  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  Past Medical History:  Diagnosis Date   Acute prostatitis 09/21/2016   AKI (acute kidney injury)    Asthma    Cancer (HCC)    Chest pain    COPD (chronic obstructive pulmonary disease) (HCC)    Dyspnea    Emphysema of lung (HCC)    Epididymoorchitis 01/28/2017   Erectile dysfunction    Family history of ischemic heart disease and other diseases of the circulatory system    GERD (gastroesophageal reflux disease) 10/25/2010   History of leukocytosis    Hyperlipidemia    Hypertension    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   Hypokalemia    Nausea    Nicotine abuse 10/25/2010   Prostatitis    Sepsis (HCC) 09/21/2016   Skin cancer    Past Surgical History:  Procedure Laterality Date   COLONOSCOPY  2010   2016   CORONARY/GRAFT ACUTE MI REVASCULARIZATION N/A 10/28/2023   Procedure: Coronary/Graft Acute MI Revascularization;  Surgeon: Verlin Lonni BIRCH, MD;  Location: [facility];  Service: Cardiovascular;  Laterality: N/A;   INGUINAL HERNIA REPAIR Right 04/11/2023   Procedure: OPEN RIGHT INGUINAL HERNIA REPAIR WITH MESH;  Surgeon: Vernetta Berg, MD;  Location: [facility];  Service: General;  Laterality: Right;   LEFT HEART CATH AND CORONARY ANGIOGRAPHY N/A 10/28/2023   Procedure: LEFT HEART CATH AND CORONARY ANGIOGRAPHY;  Surgeon: Verlin Lonni BIRCH, MD;  Location: [facility];  Service: Cardiovascular;  Laterality: N/A;   SKIN TAG REMOVAL  11/2019   TONSILLECTOMY      reports that he has been smoking e-cigarettes and cigarettes. He started smoking about 62 years ago. He has a 106 pack-year smoking history. He has quit using smokeless tobacco. He reports that he does not currently use drugs. He reports that he does not drink alcohol. family history includes Heart disease (age of onset: 31) in his father; Hypertension in his father. Allergies[1]  Review of Systems  Constitutional:  Negative for malaise/fatigue.  Eyes:  Negative  for blurred vision.  Respiratory:  Negative for shortness of breath.   Cardiovascular:  Negative for chest pain.  Gastrointestinal:  Negative for abdominal pain.  Neurological:  Negative for dizziness, weakness and headaches.      Objective:     BP 134/74   Pulse 74   Temp 98 F (36.7 C) (Oral)   Ht 5' 3.78 (1.62 m)   Wt 185 lb 1.6 oz (84 kg)   SpO2 97%   BMI 31.99 kg/m  BP Readings from Last 3 Encounters:  04/08/24 134/74  03/03/24 138/62  01/07/24 126/70   Wt Readings from Last 3 Encounters:  04/08/24 185 lb 1.6 oz (84 kg)  03/03/24 182 lb 6.4 oz (82.7 kg)  01/07/24 177 lb 8 oz (80.5 kg)      Physical Exam Vitals reviewed.  Constitutional:      General: He is not in acute distress.    Appearance: He is well-developed.  HENT:     Head: Normocephalic and atraumatic.     Right Ear: External ear normal.     Left Ear: External ear normal.  Eyes:     Conjunctiva/sclera: Conjunctivae normal.     Pupils: Pupils are equal, round, and reactive to light.  Neck:     Thyroid : No thyromegaly.  Cardiovascular:     Rate and Rhythm: Normal rate and regular rhythm.     Heart sounds: Normal heart sounds. No murmur heard. Pulmonary:     Effort: No respiratory distress.     Breath sounds: No wheezing or rales.  Abdominal:     General: Bowel sounds are normal. There is no distension.     Palpations: Abdomen is soft. There is no mass.     Tenderness: There is no abdominal tenderness. There is no guarding or rebound.  Musculoskeletal:     Cervical back: Normal range of motion and neck supple.     Right lower leg: No edema.     Left lower leg: No edema.  Lymphadenopathy:     Cervical: No cervical adenopathy.  Skin:    Findings: No rash.  Neurological:     Mental Status: He is alert and oriented to person, place, and time.     Cranial Nerves: No cranial nerve deficit.      Results for orders placed or performed in visit on 04/08/24  Hemoglobin A1c  Result Value Ref  Range   Hgb A1c MFr Bld 6.4 4.6 - 6.5 %  CMP  Result Value Ref Range   Sodium 139 135 - 145 mEq/L   Potassium 5.0 3.5 - 5.1 mEq/L   Chloride 106 96 - 112 mEq/L   CO2 27 19 - 32 mEq/L   Glucose, Bld 112 (H) 70 - 99 mg/dL   BUN 11 6 - 23 mg/dL   Creatinine, Ser 9.10 0.40 - 1.50 mg/dL   Total Bilirubin 0.7 0.2 -  1.2 mg/dL   Alkaline Phosphatase 66 39 - 117 U/L   AST 15 5 - 37 U/L   ALT 14 3 - 53 U/L   Total Protein 6.9 6.0 - 8.3 g/dL   Albumin 4.2 3.5 - 5.2 g/dL   GFR 15.76 >39.99 mL/min   Calcium  9.3 8.4 - 10.5 mg/dL  Lipid panel  Result Value Ref Range   Cholesterol 147 28 - 200 mg/dL   Triglycerides 869.9 89.9 - 149.0 mg/dL   HDL 54.19 >60.99 mg/dL   VLDL 73.9 0.0 - 59.9 mg/dL   LDL Cholesterol 75 10 - 99 mg/dL   Total CHOL/HDL Ratio 3    NonHDL 101.36     Last CBC Lab Results  Component Value Date   WBC 8.7 12/28/2023   HGB 14.6 12/28/2023   HCT 43.2 12/28/2023   MCV 92.7 12/28/2023   MCH 31.3 12/28/2023   RDW 13.3 12/28/2023   PLT 217 12/28/2023   Last metabolic panel Lab Results  Component Value Date   GLUCOSE 112 (H) 04/08/2024   NA 139 04/08/2024   K 5.0 04/08/2024   CL 106 04/08/2024   CO2 27 04/08/2024   BUN 11 04/08/2024   CREATININE 0.89 04/08/2024   GFR 84.23 04/08/2024   CALCIUM  9.3 04/08/2024   PHOS 3.3 04/25/2020   PROT 6.9 04/08/2024   ALBUMIN 4.2 04/08/2024   BILITOT 0.7 04/08/2024   ALKPHOS 66 04/08/2024   AST 15 04/08/2024   ALT 14 04/08/2024   ANIONGAP 10 12/28/2023   Last lipids Lab Results  Component Value Date   CHOL 147 04/08/2024   HDL 45.80 04/08/2024   LDLCALC 75 04/08/2024   LDLDIRECT 76.0 01/03/2021   TRIG 130.0 04/08/2024   CHOLHDL 3 04/08/2024   Last hemoglobin A1c Lab Results  Component Value Date   HGBA1C 6.4 04/08/2024   Last thyroid  functions Lab Results  Component Value Date   TSH 2.40 01/03/2021      The ASCVD Risk score (Arnett DK, et al., 2019) failed to calculate for the following reasons:    Risk score cannot be calculated because patient has a medical history suggesting prior/existing ASCVD   * - Cholesterol units were assumed    Assessment & Plan:   Physical exam.  He has chronic problems as above including CAD, hyperlipidemia, hyperglycemia, past history of nicotine use.  We discussed the following health maintenance items:  -Recommend he consider flu vaccine especially with his age and comorbidities but he declines - Discussed Shingrix vaccine and he declines - Check labs including A1c, lipid, CMP -Recent LDL not to goal at 76.  He has follow-up with cardiology Monday to discuss other potential therapies including PCSK9 inhibitors -Continue annual low-dose CT lung cancer screen -He did bring in his cuff today to compare with ours and we had substantially higher reading with our cuff than his.  We recommend a new home monitor -   Return in about 3 months (around 07/07/2024).    Wolm Scarlet, MD     [1]  Allergies Allergen Reactions   Sulfa Antibiotics Other (See Comments)    Unknown reaction   "

## 2024-04-13 NOTE — Progress Notes (Unsigned)
 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 male patient of Dr Cash, in the office today to discuss options for cholesterol management.  Mr Dimmer has a STEMI in August last year, with successful PCI to [facility].  He was discharged on rosuvastatin  40 mg, but this was decreased to 20 mg when he complained of myalgias.  In is office visit with Aline Door in December he noted that the myalgias were ongoing, so the dose was further cut back to 10 mg daily.  He is in the office today with his daughter to discuss other options for LDL lowering.    Insurance Carrier:  [employer] Medicare [phone])  $440 deductilbe then ~ $110/month  LDL Cholesterol goal:  LDL < 55  Current Medications:   rosuvastatin  10 mg daily  Previously tried:  higher doses of rosuvastatin  caused myalgias; tolerates 10 mg well      Diet:   eats out a fair amount.  Tends to eat what he wants, variety of proteins and vegetables, occasional salads and fried foods.  Admits sweets are weakness, will try to cut out as recent A1c back up to 6.4.     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, ran paint shop for years  Accessory Clinical Findings   Lab Results  Component Value Date   CHOL 147 04/08/2024   HDL 45.80 04/08/2024   LDLCALC 75 04/08/2024   LDLDIRECT 76.0 01/03/2021   TRIG 130.0 04/08/2024   CHOLHDL 3 04/08/2024    No results found for: LIPOA  Lab Results  Component Value Date   ALT 14 04/08/2024   AST 15 04/08/2024   ALKPHOS 66 04/08/2024   BILITOT 0.7 04/08/2024   Lab Results  Component Value Date    CREATININE 0.89 04/08/2024   BUN 11 04/08/2024   NA 139 04/08/2024   K 5.0 04/08/2024   CL 106 04/08/2024   CO2 27 04/08/2024   Lab Results  Component Value Date   HGBA1C 6.4 04/08/2024    Home Medications    Current Outpatient Medications  Medication Sig Dispense Refill   amLODipine  (NORVASC ) 5 MG tablet TAKE ONE (1) TABLET BY MOUTH EVERY DAY 90 tablet 3   aspirin  81 MG tablet Take 81 mg by mouth at bedtime.     Cyanocobalamin  (VITAMIN B-12 PO) Take 1 tablet by mouth at bedtime.     fluticasone  (FLONASE ) 50 MCG/ACT nasal spray Place 2 sprays into both nostrils 2 (two) times daily. 16 g 6   levocetirizine (XYZAL  ALLERGY 24HR) 5 MG tablet Take 1 tablet (5 mg total) by mouth every evening. 30 tablet 3   losartan  (COZAAR ) 50 MG tablet TAKE ONE (1) TABLET BY MOUTH EVERY DAY (Patient taking differently: Take 50 mg by mouth at bedtime. TAKE ONE (1) TABLET BY MOUTH EVERY DAY) 90 tablet 3   nitroGLYCERIN  (NITROSTAT ) 0.4 MG SL tablet Place 1 tablet (0.4 mg total) under the tongue every 5 (five) minutes as needed for chest pain. 25 tablet 1   Omega-3 Fatty Acids (  FISH OIL PO) Take 1 capsule by mouth at bedtime.     omeprazole  (PRILOSEC) 40 MG capsule TAKE ONE CAPSULE BY MOUTH DAILY 90 capsule 1   rosuvastatin  (CRESTOR ) 10 MG tablet Take 1 tablet (10 mg total) by mouth daily. 90 tablet 1   ticagrelor  (BRILINTA ) 90 MG TABS tablet Take 1 tablet (90 mg total) by mouth 2 (two) times daily. 60 tablet 11   umeclidinium-vilanterol (ANORO ELLIPTA ) 62.5-25 MCG/ACT AEPB USE 1 INHALATION DAILY 60 each 0   VITAMIN E  PO Take 1 capsule by mouth at bedtime.     No current facility-administered medications for this visit.     Assessment & Plan    Hyperlipidemia Assessment: Patient with ASCVD not at LDL goal of < 55 Most recent LDL 75 on 04/08/24 Has been compliant with moderate intensity statin : rosuvastatin  10 mg daily  Not able to tolerate higher doses of statins secondary to myalgias Reviewed  options for lowering LDL cholesterol, including ezetimibe, PCSK-9 inhibitors and inclisiran.  Discussed mechanisms of action, dosing, side effects, potential decreases in LDL cholesterol and costs.  Also reviewed potential options for patient assistance.  Plan: Patient agreeable to starting Repatha 140 mg q14d Repeat labs after:  3 months Lipid Liver function    Allean Mink, PharmD CPP [facility] [address]   [place], [place] [address] [phone]  04/14/2024, 9:34 AM       [1]  Allergies Allergen Reactions   Sulfa Antibiotics Other (See Comments)    Unknown reaction   "

## 2024-04-14 ENCOUNTER — Encounter: Payer: Self-pay | Admitting: Pharmacist Clinician (PhC)/ Clinical Pharmacy Specialist

## 2024-04-14 ENCOUNTER — Ambulatory Visit: Payer: Self-pay | Admitting: Pharmacist Clinician (PhC)/ Clinical Pharmacy Specialist

## 2024-04-14 ENCOUNTER — Telehealth: Payer: Self-pay | Admitting: Pharmacist Clinician (PhC)/ Clinical Pharmacy Specialist

## 2024-04-14 DIAGNOSIS — E785 Hyperlipidemia, unspecified: Secondary | ICD-10-CM

## 2024-04-14 NOTE — Patient Instructions (Signed)
 Your Results:             Your most recent labs Goal  Total Cholesterol 147 < 200  Triglycerides 130 < 150  HDL (happy/good cholesterol) 45.8 > 40  LDL (lousy/bad cholesterol 75 < 55   Medication changes:  We will start the process to get Repatha covered by your insurance.  Once the prior authorization is complete, I will call/send a MyChart message to let you know and confirm pharmacy information.   You will take one injection every 14 days  Lab orders:  We want to repeat labs after 2-3 months.  We will send you a lab order to remind you once we get closer to that time.    Thank you for choosing [facility]

## 2024-04-14 NOTE — Telephone Encounter (Signed)
 Please do Repatha PA

## 2024-04-14 NOTE — Assessment & Plan Note (Signed)
 Assessment: Patient with ASCVD not at LDL goal of < 55 Most recent LDL 75 on 04/08/24 Has been compliant with moderate intensity statin : rosuvastatin  10 mg daily  Not able to tolerate higher doses of statins secondary to myalgias Reviewed options for lowering LDL cholesterol, including ezetimibe, PCSK-9 inhibitors and inclisiran.  Discussed mechanisms of action, dosing, side effects, potential decreases in LDL cholesterol and costs.  Also reviewed potential options for patient assistance.  Plan: Patient agreeable to starting Repatha 140 mg q14d Repeat labs after:  3 months Lipid Liver function

## 2024-04-15 ENCOUNTER — Telehealth: Payer: Self-pay | Admitting: Pharmacy Technician

## 2024-04-15 ENCOUNTER — Other Ambulatory Visit (HOSPITAL_COMMUNITY): Payer: Self-pay

## 2024-04-15 NOTE — Telephone Encounter (Signed)
" ° °  Pharmacy Patient Advocate Encounter   Received notification from Pt Calls Messages that prior authorization for REPATHA is required/requested.   Insurance verification completed.   The patient is insured through [employer].   Per test claim: PA required; PA submitted to above mentioned insurance via Latent Key/confirmation #/EOC [id number] Status is pending  "

## 2024-04-15 NOTE — Telephone Encounter (Signed)
 PA request has been Submitted. New Encounter has been or will be created for follow up. For additional info see Pharmacy Prior Auth telephone encounter from 04/15/24.

## 2024-04-16 ENCOUNTER — Other Ambulatory Visit (HOSPITAL_COMMUNITY): Payer: Self-pay

## 2024-04-16 NOTE — Telephone Encounter (Signed)
 Pharmacy Patient Advocate Encounter  Received notification from [employer] that Prior Authorization for repatha has been APPROVED from 03/27/24 to 03/26/25. Ran test claim, Copay is $297.00. This test claim was processed through [facility]- copay amounts may vary at other pharmacies due to pharmacy/plan contracts, or as the patient moves through the different stages of their insurance plan.     250.00 deductible then 47.00

## 2024-04-21 ENCOUNTER — Other Ambulatory Visit (HOSPITAL_COMMUNITY): Payer: Self-pay

## 2024-04-21 MED ORDER — REPATHA SURECLICK 140 MG/ML ~~LOC~~ SOAJ
140.0000 mg | SUBCUTANEOUS | 3 refills | Status: AC
  Filled 2024-04-21: qty 6, 84d supply, fill #0

## 2024-04-21 NOTE — Addendum Note (Signed)
 Addended by: Kamri Gotsch L on: 04/21/2024 03:54 PM   Modules accepted: Orders

## 2024-04-24 ENCOUNTER — Other Ambulatory Visit (HOSPITAL_COMMUNITY): Payer: Self-pay

## 2024-05-15 ENCOUNTER — Ambulatory Visit: Admitting: Pulmonary Disease

## 2024-07-07 ENCOUNTER — Ambulatory Visit: Admitting: Family Medicine
